# Patient Record
Sex: Female | Born: 1990 | Race: White | Hispanic: No | Marital: Single | Smoking: Never smoker
Health system: Southern US, Community
[De-identification: ages and names within clinical notes are randomized; demographics above are authoritative.]

## PROBLEM LIST (undated history)

## (undated) ENCOUNTER — Inpatient Hospital Stay: Payer: Self-pay

## (undated) DIAGNOSIS — R51 Headache: Secondary | ICD-10-CM

## (undated) DIAGNOSIS — J45909 Unspecified asthma, uncomplicated: Secondary | ICD-10-CM

## (undated) DIAGNOSIS — G43009 Migraine without aura, not intractable, without status migrainosus: Secondary | ICD-10-CM

## (undated) DIAGNOSIS — R519 Headache, unspecified: Secondary | ICD-10-CM

## (undated) DIAGNOSIS — N200 Calculus of kidney: Secondary | ICD-10-CM

## (undated) HISTORY — DX: Migraine without aura, not intractable, without status migrainosus: G43.009

## (undated) HISTORY — DX: Unspecified asthma, uncomplicated: J45.909

## (undated) HISTORY — DX: Headache: R51

## (undated) HISTORY — DX: Headache, unspecified: R51.9

---
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
## 2014-05-02 ENCOUNTER — Emergency Department: Payer: Self-pay | Admitting: Emergency Medicine

## 2014-05-02 LAB — URINALYSIS, COMPLETE
BACTERIA: NONE SEEN
BLOOD: NEGATIVE
Bilirubin,UR: NEGATIVE
Glucose,UR: NEGATIVE mg/dL (ref 0–75)
Ketone: NEGATIVE
LEUKOCYTE ESTERASE: NEGATIVE
Nitrite: NEGATIVE
PROTEIN: NEGATIVE
Ph: 6 (ref 4.5–8.0)
RBC,UR: 1 /HPF (ref 0–5)
Specific Gravity: 1.017 (ref 1.003–1.030)
Squamous Epithelial: 6
WBC UR: 1 /HPF (ref 0–5)

## 2014-05-09 ENCOUNTER — Ambulatory Visit (INDEPENDENT_AMBULATORY_CARE_PROVIDER_SITE_OTHER): Payer: BC Managed Care – PPO | Admitting: Family Medicine

## 2014-05-09 ENCOUNTER — Encounter: Payer: Self-pay | Admitting: Family Medicine

## 2014-05-09 ENCOUNTER — Encounter (INDEPENDENT_AMBULATORY_CARE_PROVIDER_SITE_OTHER): Payer: Self-pay

## 2014-05-09 VITALS — BP 108/60 | HR 66 | Temp 98.6°F | Ht 68.0 in | Wt 173.2 lb

## 2014-05-09 DIAGNOSIS — M546 Pain in thoracic spine: Secondary | ICD-10-CM

## 2014-05-09 DIAGNOSIS — G43009 Migraine without aura, not intractable, without status migrainosus: Secondary | ICD-10-CM

## 2014-05-09 MED ORDER — IBUPROFEN 800 MG PO TABS: 800.0000 mg | ORAL_TABLET | Freq: Two times a day (BID) | ORAL | Status: DC | PRN

## 2014-05-09 MED ORDER — CYCLOBENZAPRINE HCL 10 MG PO TABS
5.0000 mg | ORAL_TABLET | Freq: Three times a day (TID) | ORAL | Status: DC | PRN
Start: 2014-05-09 — End: 2015-12-07

## 2014-05-09 NOTE — Progress Notes (Signed)
.  Pre visit review using our clinic review tool, if applicable. No additional management support is needed unless otherwise documented below in the visit note.  To ER with back pain.  Had urinary frequency but no radicular leg sx.  No rash.  Pain was better laying down.  No fevers.  LMP 05/05/14.  Neg w/u at ER.  Taking ibuprofen for pain.  Local heat helps.  No trauma.  Urinary sx resolved.  ER records reviewed.   H/o frequent HA.  Throbbing, no aura, top of the head.  Nausea and vomiting.  +photo/phonophobia.  No known FH migraines.  Longstanding per patient.  Recently dec caffeine.    PMH and SH reviewed  ROS: See HPI, otherwise noncontributory.  Meds, vitals, and allergies reviewed.   GEN: nad, alert and oriented HEENT: mucous membranes moist NECK: supple w/o LA CV: rrr.  no murmur PULM: ctab, no inc wob ABD: soft, +bs EXT: no edema SKIN: no acute rash CN 2-12 wnl B, S/S/DTR wnl x4 No CVA pain but lower T spine with B paraspinal muscle tenderness and tightness.  No midline pain.

## 2014-05-09 NOTE — Patient Instructions (Signed)
Gently stretch your back and use heat to help loosen the muscles.  Take ibuprofen twice a day with food if needed.  Take flexeril if needed for back pain or for headaches. It can make you drowsy.  Start with a half tab.  Take care. Glad to see you.

## 2014-05-10 ENCOUNTER — Telehealth: Payer: Self-pay | Admitting: Family Medicine

## 2014-05-10 NOTE — Telephone Encounter (Signed)
emmi emailed °

## 2014-05-11 ENCOUNTER — Encounter: Payer: Self-pay | Admitting: Family Medicine

## 2014-05-11 DIAGNOSIS — M549 Dorsalgia, unspecified: Secondary | ICD-10-CM | POA: Insufficient documentation

## 2014-05-11 DIAGNOSIS — G43009 Migraine without aura, not intractable, without status migrainosus: Secondary | ICD-10-CM | POA: Insufficient documentation

## 2014-05-11 NOTE — Assessment & Plan Note (Signed)
Likely benign MSK source.  D/w pt.  Use heat, gently stretch, flexeril with sedation caution.  She agrees.

## 2014-05-11 NOTE — Assessment & Plan Note (Signed)
Already dec in caffeine, d/w pt.  Can use flexeril as above and use for likely migraine.  Sedation caution.  She agrees.  Update me as needed.

## 2014-06-13 ENCOUNTER — Other Ambulatory Visit (HOSPITAL_COMMUNITY)
Admission: RE | Admit: 2014-06-13 | Discharge: 2014-06-13 | Disposition: A | Discharge status: Home / Self Care | Payer: BC Managed Care – PPO | Source: Ambulatory Visit | Attending: Family Medicine | Admitting: Family Medicine

## 2014-06-13 ENCOUNTER — Encounter: Payer: Self-pay | Admitting: Family Medicine

## 2014-06-13 ENCOUNTER — Ambulatory Visit (INDEPENDENT_AMBULATORY_CARE_PROVIDER_SITE_OTHER): Payer: BC Managed Care – PPO | Admitting: Family Medicine

## 2014-06-13 VITALS — BP 98/64 | HR 82 | Temp 98.2°F | Ht 70.0 in | Wt 168.2 lb

## 2014-06-13 DIAGNOSIS — Z01419 Encounter for gynecological examination (general) (routine) without abnormal findings: Secondary | ICD-10-CM | POA: Diagnosis not present

## 2014-06-13 DIAGNOSIS — Z113 Encounter for screening for infections with a predominantly sexual mode of transmission: Secondary | ICD-10-CM | POA: Diagnosis present

## 2014-06-13 DIAGNOSIS — Z7189 Other specified counseling: Secondary | ICD-10-CM

## 2014-06-13 DIAGNOSIS — Z131 Encounter for screening for diabetes mellitus: Secondary | ICD-10-CM

## 2014-06-13 DIAGNOSIS — G43009 Migraine without aura, not intractable, without status migrainosus: Secondary | ICD-10-CM

## 2014-06-13 DIAGNOSIS — Z124 Encounter for screening for malignant neoplasm of cervix: Secondary | ICD-10-CM

## 2014-06-13 NOTE — Patient Instructions (Signed)
Go to the lab on the way out.  We'll contact you with your lab report. Take care.  Glad to see you.  If you want to get an IUD or other form of birth control, then let [place] know.

## 2014-06-13 NOTE — Assessment & Plan Note (Signed)
Done, gc/chlam pending.  D/w pt about birth control options.  She'll consider.  See notes on labs.

## 2014-06-13 NOTE — Assessment & Plan Note (Signed)
Improved with prn flexeril.
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
## 2014-06-13 NOTE — Assessment & Plan Note (Signed)
See notes on labs. 

## 2014-06-14 LAB — GLUCOSE, RANDOM: Glucose, Bld: 81 mg/dL (ref 70–99)

## 2014-06-17 LAB — CYTOLOGY - PAP

## 2015-03-13 ENCOUNTER — Emergency Department
Admission: EM | Admit: 2015-03-13 | Discharge: 2015-03-13 | Disposition: A | Discharge status: Home / Self Care | Payer: No Typology Code available for payment source | Attending: Emergency Medicine | Admitting: Emergency Medicine

## 2015-03-13 ENCOUNTER — Encounter: Payer: Self-pay | Admitting: *Deleted

## 2015-03-13 DIAGNOSIS — S161XXA Strain of muscle, fascia and tendon at neck level, initial encounter: Secondary | ICD-10-CM | POA: Insufficient documentation

## 2015-03-13 DIAGNOSIS — T310 Burns involving less than 10% of body surface: Secondary | ICD-10-CM

## 2015-03-13 DIAGNOSIS — Y9389 Activity, other specified: Secondary | ICD-10-CM | POA: Diagnosis not present

## 2015-03-13 DIAGNOSIS — S199XXA Unspecified injury of neck, initial encounter: Secondary | ICD-10-CM | POA: Diagnosis present

## 2015-03-13 DIAGNOSIS — G44209 Tension-type headache, unspecified, not intractable: Secondary | ICD-10-CM

## 2015-03-13 DIAGNOSIS — S3992XA Unspecified injury of lower back, initial encounter: Secondary | ICD-10-CM | POA: Diagnosis not present

## 2015-03-13 DIAGNOSIS — Y998 Other external cause status: Secondary | ICD-10-CM | POA: Insufficient documentation

## 2015-03-13 DIAGNOSIS — Y92410 Unspecified street and highway as the place of occurrence of the external cause: Secondary | ICD-10-CM | POA: Insufficient documentation

## 2015-03-13 DIAGNOSIS — S0990XA Unspecified injury of head, initial encounter: Secondary | ICD-10-CM | POA: Insufficient documentation

## 2015-03-13 DIAGNOSIS — S59912A Unspecified injury of left forearm, initial encounter: Secondary | ICD-10-CM | POA: Diagnosis not present

## 2015-03-13 MED ORDER — IBUPROFEN 800 MG PO TABS: 800.0000 mg | ORAL_TABLET | Freq: Three times a day (TID) | ORAL | Status: DC | PRN

## 2015-03-13 MED ORDER — SILVER SULFADIAZINE 1 % EX CREA: TOPICAL_CREAM | CUTANEOUS | Status: DC

## 2015-03-13 MED ORDER — BUTALBITAL-APAP-CAFFEINE 50-325-40 MG PO TABS: 1.0000 | ORAL_TABLET | Freq: Four times a day (QID) | ORAL | Status: DC | PRN

## 2015-03-13 MED ORDER — IBUPROFEN 800 MG PO TABS
800.0000 mg | ORAL_TABLET | Freq: Once | ORAL | Status: AC
  Administered 2015-03-13: 800 mg via ORAL
  Filled 2015-03-13: qty 1

## 2015-03-13 MED ORDER — BUTALBITAL-APAP-CAFFEINE 50-325-40 MG PO TABS
1.0000 | ORAL_TABLET | Freq: Once | ORAL | Status: AC
  Administered 2015-03-13: 1 via ORAL
  Filled 2015-03-13: qty 1
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
## 2015-03-13 NOTE — Discharge Instructions (Signed)

## 2015-03-15 ENCOUNTER — Emergency Department: Payer: No Typology Code available for payment source

## 2015-03-15 ENCOUNTER — Encounter: Payer: Self-pay | Admitting: Emergency Medicine

## 2015-03-15 ENCOUNTER — Emergency Department
Admission: EM | Admit: 2015-03-15 | Discharge: 2015-03-15 | Disposition: A | Discharge status: Home / Self Care | Payer: No Typology Code available for payment source | Attending: Student | Admitting: Student

## 2015-03-15 DIAGNOSIS — Y9389 Activity, other specified: Secondary | ICD-10-CM | POA: Diagnosis not present

## 2015-03-15 DIAGNOSIS — Y92410 Unspecified street and highway as the place of occurrence of the external cause: Secondary | ICD-10-CM | POA: Insufficient documentation

## 2015-03-15 DIAGNOSIS — M545 Low back pain, unspecified: Secondary | ICD-10-CM

## 2015-03-15 DIAGNOSIS — S3992XA Unspecified injury of lower back, initial encounter: Secondary | ICD-10-CM | POA: Diagnosis not present

## 2015-03-15 DIAGNOSIS — Y998 Other external cause status: Secondary | ICD-10-CM | POA: Insufficient documentation

## 2015-03-15 DIAGNOSIS — Z792 Long term (current) use of antibiotics: Secondary | ICD-10-CM | POA: Diagnosis not present

## 2015-03-15 MED ORDER — TRAMADOL HCL 50 MG PO TABS: 50.0000 mg | ORAL_TABLET | Freq: Four times a day (QID) | ORAL | Status: DC | PRN
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
## 2015-07-02 NOTE — L&D Delivery Note (Signed)
Delivery Summary for Energy Transfer PartnersChelsea L Ortiz  Labor Events:   Preterm labor:   Rupture date:   Rupture time:   Rupture type: Spontaneous  Fluid Color: Clear  Induction:   Augmentation:   Complications:   Cervical ripening:          Delivery:   Episiotomy:   Lacerations:   Repair suture:   Repair # of packets:   Blood loss (ml): 100   Information for the patient's newborn:  Teresa LiasSnipes, Teresa Teresa [[id number]][[id number]]    Delivery 06/01/2016 4:41 AM by  Vaginal, Spontaneous Delivery Sex:  female Gestational Age: [id number] Delivery Clinician:   Living?:         APGARS  One minute Five minutes Ten minutes  Skin color:        Heart rate:        Grimace:        Muscle tone:        Breathing:        Totals: 8   9     Presentation/position:      Resuscitation:   Cord information:    Disposition of cord blood:     Blood gases sent?  Complications:   Placenta: Delivered:       appearance Newborn Measurements: Weight: 6 lb 1 oz (2750 g)  Height: 19.5"  Head circumference:    Chest circumference:    Other providers:    Additional  information: Forceps:   Vacuum:   Breech:   Observed anomalies       Delivery Note At 4:41 AM a viable and healthy female was delivered via Vaginal, Spontaneous Delivery (Presentation: Vertex; LOA position).  APGAR: 8, 9; weight 6 lb 1 oz (2750 g).   Placenta status: spontaneously removed, intact.  Cord: 3-vessel, with the following complications: nuchal cord x 2, reducible.  Cord pH: not obtained. Cord blood obtained.  Delayed cord clamping performed.   Anesthesia:  Epidural Episiotomy: None Lacerations:  None Suture Repair: None Est. Blood Loss (mL):  100  Mom to postpartum.  Baby to Couplet care / Skin to Skin.  Hildred Lasernika Gwin Eagon 06/01/2016, 5:01 AM

## 2015-11-18 ENCOUNTER — Emergency Department
Admission: EM | Admit: 2015-11-18 | Discharge: 2015-11-18 | Disposition: A | Discharge status: Home / Self Care | Payer: BC Managed Care – PPO | Attending: Emergency Medicine | Admitting: Emergency Medicine

## 2015-11-18 ENCOUNTER — Emergency Department: Payer: BC Managed Care – PPO

## 2015-11-18 ENCOUNTER — Encounter: Payer: Self-pay | Admitting: Emergency Medicine

## 2015-11-18 DIAGNOSIS — O99891 Other specified diseases and conditions complicating pregnancy: Secondary | ICD-10-CM

## 2015-11-18 DIAGNOSIS — M549 Dorsalgia, unspecified: Secondary | ICD-10-CM

## 2015-11-18 DIAGNOSIS — O26891 Other specified pregnancy related conditions, first trimester: Secondary | ICD-10-CM | POA: Insufficient documentation

## 2015-11-18 DIAGNOSIS — R102 Pelvic and perineal pain: Secondary | ICD-10-CM | POA: Insufficient documentation

## 2015-11-18 DIAGNOSIS — O9989 Other specified diseases and conditions complicating pregnancy, childbirth and the puerperium: Secondary | ICD-10-CM

## 2015-11-18 DIAGNOSIS — J45909 Unspecified asthma, uncomplicated: Secondary | ICD-10-CM | POA: Diagnosis not present

## 2015-11-18 DIAGNOSIS — Z3A10 10 weeks gestation of pregnancy: Secondary | ICD-10-CM | POA: Insufficient documentation

## 2015-11-18 DIAGNOSIS — R109 Unspecified abdominal pain: Secondary | ICD-10-CM | POA: Diagnosis present

## 2015-11-18 DIAGNOSIS — M545 Low back pain: Secondary | ICD-10-CM | POA: Insufficient documentation

## 2015-11-18 DIAGNOSIS — R52 Pain, unspecified: Secondary | ICD-10-CM

## 2015-11-18 DIAGNOSIS — Z79899 Other long term (current) drug therapy: Secondary | ICD-10-CM | POA: Diagnosis not present

## 2015-11-18 LAB — BASIC METABOLIC PANEL
ANION GAP: 9 (ref 5–15)
BUN: 16 mg/dL (ref 6–20)
CALCIUM: 9.5 mg/dL (ref 8.9–10.3)
CHLORIDE: 102 mmol/L (ref 101–111)
CO2: 25 mmol/L (ref 22–32)
CREATININE: 1.33 mg/dL — AB (ref 0.44–1.00)
GFR calc non Af Amer: 55 mL/min — ABNORMAL LOW (ref >60)
Glucose, Bld: 114 mg/dL — ABNORMAL HIGH (ref 65–99)
Potassium: 3.4 mmol/L — ABNORMAL LOW (ref 3.5–5.1)
SODIUM: 136 mmol/L (ref 135–145)

## 2015-11-18 LAB — URINALYSIS COMPLETE WITH MICROSCOPIC (ARMC ONLY)
BILIRUBIN URINE: NEGATIVE
GLUCOSE, UA: NEGATIVE mg/dL
KETONES UR: NEGATIVE mg/dL
Leukocytes, UA: NEGATIVE
NITRITE: NEGATIVE
Protein, ur: NEGATIVE mg/dL
SPECIFIC GRAVITY, URINE: 1.008 (ref 1.005–1.030)
pH: 6 (ref 5.0–8.0)

## 2015-11-18 LAB — CBC
HCT: 36.4 % (ref 35.0–47.0)
Hemoglobin: 11.9 g/dL — ABNORMAL LOW (ref 12.0–16.0)
MCH: 23.6 pg — ABNORMAL LOW (ref 26.0–34.0)
MCHC: 32.7 g/dL (ref 32.0–36.0)
MCV: 72.1 fL — AB (ref 80.0–100.0)
Platelets: 242 10*3/uL (ref 150–440)
RBC: 5.05 MIL/uL (ref 3.80–5.20)
RDW: 21.5 % — ABNORMAL HIGH (ref 11.5–14.5)
WBC: 9.4 10*3/uL (ref 3.6–11.0)

## 2015-11-18 LAB — HCG, QUANTITATIVE, PREGNANCY: HCG, BETA CHAIN, QUANT, S: 209987 m[IU]/mL — AB (ref <5)

## 2015-11-18 NOTE — ED Provider Notes (Signed)
[facility] Emergency Department Provider Note    ____________________________________________  Time seen: ~1635  I have reviewed the triage vital signs and the nursing notes.   HISTORY  Chief Complaint Flank Pain and Emesis   History limited by: Not Limited   HPI Teresa Ortiz is a 25 y.o. female at roughly [redacted] weeks pregnant by dates who presents to the emergency department today because of concern for flank pain. Patient describes the pain as being located in the right flank and back. She states that it started about a week ago. She states it had been somewhat manageable until last night. She states that it woke her up from sleep and kept her up for a number of hours. She describes it as a pressure-like pain. She describes it reminds her somewhat of labor pains. The patient does not have any unusual vaginal discharge or bleeding. No change in urination. No fevers.   Past Medical History  Diagnosis Date  . Frequent headaches   . Migraine without aura   . Asthma     childhood    Patient Active Problem List   Diagnosis Date Noted  . Advance care planning 06/13/2014  . Pap smear for cervical cancer screening 06/13/2014  . Diabetes mellitus screening 06/13/2014  . Migraine without aura 05/11/2014  . Back pain 05/11/2014    History reviewed. No pertinent past surgical history.  Current Outpatient Rx  Name  Route  Sig  Dispense  Refill  . butalbital-acetaminophen-caffeine (FIORICET) 50-325-40 MG per tablet   Oral   Take 1-2 tablets by mouth every 6 (six) hours as needed for headache.   12 tablet   0   . cyclobenzaprine (FLEXERIL) 10 MG tablet   Oral   Take 0.5-1 tablets (5-10 mg total) by mouth 3 (three) times daily as needed for muscle spasms (sedation caution).   30 tablet   1   . ibuprofen (ADVIL,MOTRIN) 800 MG tablet   Oral   Take 1 tablet (800 mg total) by mouth every 8 (eight) hours as needed.   30 tablet   0   . silver  sulfADIAZINE (SILVADENE) 1 % cream      Apply to affected area daily   50 g   1   . traMADol (ULTRAM) 50 MG tablet   Oral   Take 1 tablet (50 mg total) by mouth every 6 (six) hours as needed for moderate pain.   12 tablet   0     Allergies Sulfa antibiotics  Family History  Problem Relation Age of Onset  . Mental illness Mother     bipolor/depressed  . Alcohol abuse Father     Social History Social History  Substance Use Topics  . Smoking status: Never Smoker   . Smokeless tobacco: Never Used  . Alcohol Use: 0.0 oz/week    0 Standard drinks or equivalent per week     Comment: rarely    Review of Systems  Constitutional: Negative for fever. Cardiovascular: Negative for chest pain. Respiratory: Negative for shortness of breath. Gastrointestinal: Positive for right flank. Genitourinary: Negative for dysuria. Musculoskeletal: Positive for back pain Skin: Negative for rash. Neurological: Negative for headaches, focal weakness or numbness.  10-point ROS otherwise negative.  ____________________________________________   PHYSICAL EXAM:  VITAL SIGNS: ED Triage Vitals  Enc Vitals Group     BP 11/18/15 1343 116/73 mmHg     Pulse Rate 11/18/15 1343 69     Resp 11/18/15 1343 18  Temp 11/18/15 1343 98.2 F (36.8 C)     Temp Source 11/18/15 1343 Oral     SpO2 11/18/15 1343 100 %     Weight 11/18/15 1343 170 lb (77.111 kg)     Height 11/18/15 1343  (1.778 m)     Head Cir --      Peak Flow --      Pain Score 11/18/15 1344 9   Constitutional: Alert and oriented. Well appearing and in no distress. Eyes: Conjunctivae are normal. PERRL. Normal extraocular movements. ENT   Head: Normocephalic and atraumatic.   Nose: No congestion/rhinnorhea.   Mouth/Throat: Mucous membranes are moist.   Neck: No stridor. Hematological/Lymphatic/Immunilogical: No cervical lymphadenopathy. Cardiovascular: Normal rate, regular rhythm.  No murmurs, rubs, or  gallops. Respiratory: Normal respiratory effort without tachypnea nor retractions. Breath sounds are clear and equal bilaterally. No wheezes/rales/rhonchi. Gastrointestinal: Soft and nontender. No distention.  Genitourinary: Deferred Musculoskeletal: Normal range of motion in all extremities. No joint effusions.  No lower extremity tenderness nor edema. Neurologic:  Normal speech and language. No gross focal neurologic deficits are appreciated.  Skin:  Skin is warm, dry and intact. No rash noted. Psychiatric: Mood and affect are normal. Speech and behavior are normal. Patient exhibits appropriate insight and judgment.  ____________________________________________    LABS (pertinent positives/negatives)  Labs Reviewed  URINALYSIS COMPLETEWITH MICROSCOPIC ([facility] ONLY) - Abnormal; Notable for the following:    Color, Urine YELLOW (*)    APPearance HAZY (*)    Hgb urine dipstick 1+ (*)    Bacteria, UA RARE (*)    Squamous Epithelial / LPF 6-30 (*)    All other components within normal limits  CBC - Abnormal; Notable for the following:    Hemoglobin 11.9 (*)    MCV 72.1 (*)    MCH 23.6 (*)    RDW 21.5 (*)    All other components within normal limits  BASIC METABOLIC PANEL - Abnormal; Notable for the following:    Potassium 3.4 (*)    Glucose, Bld 114 (*)    Creatinine, Ser 1.33 (*)    GFR calc non Af Amer 55 (*)    All other components within normal limits  HCG, QUANTITATIVE, PREGNANCY - Abnormal; Notable for the following:    hCG, Beta Francene Finders [id number] (*)    All other components within normal limits     ____________________________________________   EKG  I, Phineas Semen, attending physician, personally viewed and interpreted this EKG  EKG Time: 1356 Rate: 73 Rhythm: normal sinus rhythm Axis: normal Intervals: qtc 401 QRS: narrow ST changes: no st elevation Impression: normal EKG  ____________________________________________    RADIOLOGY  US  pelvis IMPRESSION: Single live IUP. No cause for pain identified.  ____________________________________________   PROCEDURES  Procedure(s) performed: None  Critical Care performed: No  ____________________________________________   INITIAL IMPRESSION / ASSESSMENT AND PLAN / ED COURSE  Pertinent labs & imaging results that were available during my care of the patient were reviewed by me and considered in my medical decision making (see chart for details).  Patient presents to the emergency department today with roughly 1 week of right flank and back pain. On exam abdomen is benign. Patient has not had an ultrasound performed on this pregnancy. Given concern for ectopic will obtain ultrasound. Blood work without any concerning elevation of leukocytosis.  ----------------------------------------- 6:28 PM on 11/18/2015 -----------------------------------------  Ultrasound shows a single intrauterine pregnancy at 10 weeks. I did discussion with the patient that the  ultrasound looked good. Additionally given lack of fever or leukocytosis I discussed with patient that I would think appendicitis to be very unlikely. Urine also without any concerning signs of kidney infection. Did discuss return precautions with the patient. Will have her follow up with primary care.  ____________________________________________   FINAL CLINICAL IMPRESSION(S) / ED DIAGNOSES  Final diagnoses:  Pelvic pain in female  Back pain in pregnancy     Phineas SemenGraydon Yesenia Fontenette, MD 11/18/15 [id number]

## 2015-11-18 NOTE — ED Notes (Signed)
Upon hooking pt up for pulse, HR noted to be jumping from 74-120, not regular, pulse palpated and noted to be the same. EKG ordered.

## 2015-11-18 NOTE — Discharge Instructions (Signed)
Please seek medical attention for any high fevers, chest pain, shortness of breath, change in behavior, persistent vomiting, bloody stool or any other new or concerning symptoms.   Abdominal Pain During Pregnancy Belly (abdominal) pain is common during pregnancy. Most of the time, it is not a serious problem. Other times, it can be a sign that something is wrong with the pregnancy. Always tell your doctor if you have belly pain. HOME CARE Monitor your belly pain for any changes. The following actions may help you feel better:  Do not have sex (intercourse) or put anything in your vagina until you feel better.  Rest until your pain stops.  Drink clear fluids if you feel sick to your stomach (nauseous). Do not eat solid food until you feel better.  Only take medicine as told by your doctor.  Keep all doctor visits as told. GET HELP RIGHT AWAY IF:   You are bleeding, leaking fluid, or pieces of tissue come out of your vagina.  You have more pain or cramping.  You keep throwing up (vomiting).  You have pain when you pee (urinate) or have blood in your pee.  You have a fever.  You do not feel your baby moving as much.  You feel very weak or feel like passing out.  You have trouble breathing, with or without belly pain.  You have a very bad headache and belly pain.  You have fluid leaking from your vagina and belly pain.  You keep having watery poop (diarrhea).  Your belly pain does not go away after resting, or the pain gets worse. MAKE SURE YOU:   Understand these instructions.  Will watch your condition.  Will get help right away if you are not doing well or get worse.   This information is not intended to replace advice given to you by your health care provider. Make sure you discuss any questions you have with your health care provider.   Document Released: 06/05/2009 Document Revised: 02/17/2013 Document Reviewed: 01/14/2013 Elsevier Interactive Patient Education  [employer] Elsevier Inc.

## 2015-11-18 NOTE — ED Notes (Signed)
Pt here with c/o right flank pain, nausea/vomiting and pain when she takes a deep breath, is 11 pregnant, denies vaginal bleeding, tearful in triage.

## 2015-11-18 NOTE — ED Notes (Signed)
Patient resting comfortably. Awaiting results. Call bell within reach.

## 2015-12-07 ENCOUNTER — Ambulatory Visit (INDEPENDENT_AMBULATORY_CARE_PROVIDER_SITE_OTHER): Payer: Medicaid Other

## 2015-12-07 ENCOUNTER — Encounter: Payer: Self-pay | Admitting: Obstetrics and Gynecology

## 2015-12-07 ENCOUNTER — Other Ambulatory Visit: Payer: Self-pay | Admitting: Obstetrics and Gynecology

## 2015-12-07 ENCOUNTER — Ambulatory Visit (INDEPENDENT_AMBULATORY_CARE_PROVIDER_SITE_OTHER): Payer: Medicaid Other | Admitting: Obstetrics and Gynecology

## 2015-12-07 VITALS — BP 96/59 | HR 75 | Wt 167.5 lb

## 2015-12-07 DIAGNOSIS — Z8669 Personal history of other diseases of the nervous system and sense organs: Secondary | ICD-10-CM

## 2015-12-07 DIAGNOSIS — Z283 Underimmunization status: Secondary | ICD-10-CM

## 2015-12-07 DIAGNOSIS — Z36 Encounter for antenatal screening of mother: Secondary | ICD-10-CM

## 2015-12-07 DIAGNOSIS — O9989 Other specified diseases and conditions complicating pregnancy, childbirth and the puerperium: Secondary | ICD-10-CM

## 2015-12-07 DIAGNOSIS — Z3682 Encounter for antenatal screening for nuchal translucency: Secondary | ICD-10-CM

## 2015-12-07 DIAGNOSIS — Z2839 Other underimmunization status: Secondary | ICD-10-CM

## 2015-12-07 DIAGNOSIS — Z3492 Encounter for supervision of normal pregnancy, unspecified, second trimester: Secondary | ICD-10-CM | POA: Diagnosis not present

## 2015-12-07 DIAGNOSIS — Z3490 Encounter for supervision of normal pregnancy, unspecified, unspecified trimester: Secondary | ICD-10-CM

## 2015-12-07 NOTE — Progress Notes (Signed)
[facility]  GYN ENCOUNTER NOTE  Subjective:       Teresa Ortiz is a 25 y.o. G6P1001 female is here for gynecologic evaluation of the following issues:  1. New OB history and physical.    25 year old single monogamous white female, G2 para 1001, ultrasound on 11/18/2015 demonstrated a 10.0 week viable intrauterine pregnancy with EDD 06/15/2016, currently 12.[redacted] weeks gestation, presents now for establishment of prenatal care.  Patient reports breast tenderness and mild nausea without vomiting. She is not experiencing any vaginal bleeding  Prenatal risk factors:  Migraine headaches   Gynecologic History LMP: Uncertain Contraception: None No history of abnormal Pap smears  Obstetric History OB History  Gravida Para Term Preterm AB SAB TAB Ectopic Multiple Living  # Outcome Date GA Lbr Len/2nd Weight Sex Delivery Anes PTL Lv  2 Current           1 Term 2011   6 lb 6.4 oz (2.903 kg) F Vag-Spont   Y      Past Medical History  Diagnosis Date  . Frequent headaches   . Migraine without aura   . Asthma     childhood    No past surgical history on file.  No current outpatient prescriptions on file prior to visit.   No current facility-administered medications on file prior to visit.    Allergies  Allergen Reactions  . Sulfa Antibiotics Rash    Social History   Social History  . Marital Status: Single    Spouse Name: N/A  . Number of Children: N/A  . Years of Education: N/A   Occupational History  . Not on file.   Social History Main Topics  . Smoking status: Never Smoker   . Smokeless tobacco: Never Used  . Alcohol Use: 0.0 oz/week    0 Standard drinks or equivalent per week     Comment: rarely  . Drug Use: Not on file  . Sexual Activity: Not on file   Other Topics Concern  . Not on file   Social History Narrative   1 child   Lives with boyfriend and his 2 kids    Family History  Problem Relation Age of Onset  . Mental illness  Mother     bipolor/depressed  . Alcohol abuse Father     The following portions of the patient's history were reviewed and updated as appropriate: allergies, current medications, past family history, past medical history, past social history, past surgical history and problem list.  Review of Systems Review of Systems - General ROS: negative for - chills, fatigue, fever, hot flashes, malaise or night sweats Hematological and Lymphatic ROS: negative for - bleeding problems or swollen lymph nodes Gastrointestinal ROS: negative for - abdominal pain, blood in stools, change in bowel habits and nausea/vomiting Musculoskeletal ROS: negative for - joint pain, muscle pain or muscular weakness Genito-Urinary ROS: negative for - change in menstrual cycle, dysmenorrhea, dyspareunia, dysuria, genital discharge, genital ulcers, hematuria, incontinence, irregular/heavy menses, nocturia or pelvic pain POSITIVE-breast tenderness and nausea  Objective:   BP 96/59 mmHg  Pulse 75  Wt 167 lb 8 oz (75.978 kg)  LMP 09/12/2015 CONSTITUTIONAL: Well-developed, well-nourished female in no acute distress.  HENT:  Normocephalic, atraumatic.  NECK: Normal range of motion, supple, no masses.  Normal thyroid.  SKIN: Skin is warm and dry. No rash noted. Not diaphoretic. No erythema. No pallor. NEUROLGIC: Alert and oriented to person,  place, and time. PSYCHIATRIC: Normal mood and affect. Normal behavior. Normal judgment and thought content. CARDIOVASCULAR:Not Examined RESPIRATORY: Not Examined BREASTS: Not Examined ABDOMEN: Soft, non distended; Non tender.  No Organomegaly. PELVIC:  External Genitalia: Normal  BUS: Normal  Vagina: Normal  Cervix: Normal  Uterus: Retroverted, 12 weeks size, normal shape, normal consistency, mobile  Adnexa: Normal  RV: Normal external exam  Bladder: Nontender MUSCULOSKELETAL: Normal range of motion. No tenderness.  No cyanosis, clubbing, or edema.     Assessment:   1.  Supervision of normal pregnancy, second trimester - Pap IG, CT/NG w/ reflex HPV when ASC-U - ABO AND RH  - Antibody screen - CBC with Differential/Platelet - Culture, OB Urine - Hepatitis B surface antigen - HIV antibody - Monitor Drug Profile 14(MW) - Nicotine screen, urine - Varicella zoster antibody, IgG - Urinalysis, Routine w reflex microscopic (not at [facility]) - Rubella screen - RPR - US Fetal Nuchal Translucency Measurement; Future  2. Migraine headaches     Plan:   1. Prenatal vitamins daily 2. Nuchal translucency testing-patient desires 3.New OB counseling: The patient has been given an overview regarding routine prenatal care. Recommendations regarding diet, weight gain, and exercise in pregnancy were given. Prenatal testing, optional genetic testing, and ultrasound use in pregnancy were reviewed.  Benefits of Breast Feeding were discussed. The patient is encouraged to consider nursing her baby post partum. 4. Regular OB appointment in 4 weeks  Herold HarmsMartin A Zakira Ressel, MD  Note: This dictation was prepared with Dragon dictation along with smaller phrase technology. Any transcriptional errors that result from this process are unintentional.

## 2015-12-07 NOTE — Patient Instructions (Signed)
1. Pap smear is done 2.  NewOB labs are obtained 3. Nuchal translucency testing is ordered 4. Return in 4 weeks for regular OB appointment

## 2015-12-08 LAB — CBC WITH DIFFERENTIAL/PLATELET
BASOS: 0 %
Basophils Absolute: 0 10*3/uL (ref 0.0–0.2)
EOS (ABSOLUTE): 0 10*3/uL (ref 0.0–0.4)
EOS: 0 %
HEMATOCRIT: 37 % (ref 34.0–46.6)
Hemoglobin: 11.8 g/dL (ref 11.1–15.9)
IMMATURE GRANS (ABS): 0 10*3/uL (ref 0.0–0.1)
IMMATURE GRANULOCYTES: 0 %
LYMPHS: 17 %
Lymphocytes Absolute: 1.2 10*3/uL (ref 0.7–3.1)
MCH: 24.6 pg — ABNORMAL LOW (ref 26.6–33.0)
MCHC: 31.9 g/dL (ref 31.5–35.7)
MCV: 77 fL — AB (ref 79–97)
MONOS ABS: 0.4 10*3/uL (ref 0.1–0.9)
Monocytes: 5 %
NEUTROS PCT: 78 %
Neutrophils Absolute: 5.6 10*3/uL (ref 1.4–7.0)
PLATELETS: 289 10*3/uL (ref 150–379)
RBC: 4.8 x10E6/uL (ref 3.77–5.28)
RDW: 20.6 % — AB (ref 12.3–15.4)
WBC: 7.2 10*3/uL (ref 3.4–10.8)

## 2015-12-08 LAB — RUBELLA SCREEN

## 2015-12-08 LAB — VARICELLA ZOSTER ANTIBODY, IGG: VARICELLA: 958 {index} (ref >165)

## 2015-12-08 LAB — HEPATITIS B SURFACE ANTIGEN: Hepatitis B Surface Ag: NEGATIVE

## 2015-12-08 LAB — HIV ANTIBODY (ROUTINE TESTING W REFLEX): HIV SCREEN 4TH GENERATION: NONREACTIVE

## 2015-12-08 LAB — ABO AND RH: Rh Factor: POSITIVE

## 2015-12-08 LAB — ANTIBODY SCREEN: Antibody Screen: NEGATIVE

## 2015-12-08 LAB — RPR: RPR Ser Ql: NONREACTIVE

## 2015-12-09 LAB — URINALYSIS, ROUTINE W REFLEX MICROSCOPIC
BILIRUBIN UA: NEGATIVE
GLUCOSE, UA: NEGATIVE
KETONES UA: NEGATIVE
LEUKOCYTES UA: NEGATIVE
Nitrite, UA: NEGATIVE
PROTEIN UA: NEGATIVE
RBC UA: NEGATIVE
SPEC GRAV UA: 1.018 (ref 1.005–1.030)
UUROB: 0.2 mg/dL (ref 0.2–1.0)
pH, UA: 6 (ref 5.0–7.5)

## 2015-12-09 LAB — MONITOR DRUG PROFILE 14(MW)
AMPHETAMINE SCREEN URINE: NEGATIVE ng/mL
BARBITURATE SCREEN URINE: NEGATIVE ng/mL
BENZODIAZEPINE SCREEN, URINE: NEGATIVE ng/mL
Buprenorphine, Urine: NEGATIVE ng/mL
CANNABINOIDS UR QL SCN: NEGATIVE ng/mL
Cocaine (Metab) Scrn, Ur: NEGATIVE ng/mL
Creatinine(Crt), U: 95.3 mg/dL (ref 20.0–300.0)
FENTANYL, URINE: NEGATIVE pg/mL
Meperidine Screen, Urine: NEGATIVE ng/mL
Methadone Screen, Urine: NEGATIVE ng/mL
OXYCODONE+OXYMORPHONE UR QL SCN: NEGATIVE ng/mL
Opiate Scrn, Ur: NEGATIVE ng/mL
PH UR, DRUG SCRN: 5.9 (ref 4.5–8.9)
PROPOXYPHENE SCREEN URINE: NEGATIVE ng/mL
Phencyclidine Qn, Ur: NEGATIVE ng/mL
SPECIFIC GRAVITY: 1.023
Tramadol Screen, Urine: NEGATIVE ng/mL

## 2015-12-09 LAB — NICOTINE SCREEN, URINE: COTININE UR QL SCN: NEGATIVE ng/mL

## 2015-12-11 LAB — FIRST TRIMESTER SCREEN W/NT
CRL: 65.8 mm
DIA MoM: 0.83
DIA Value: 187.5 pg/mL
GEST AGE-COLLECT: 12.7 wk
HCG MOM: 1.2
HCG VALUE: 96.5 [IU]/mL
MATERNAL AGE AT EDD: 26 a
NUCHAL TRANSLUCENCY MOM: 0.97
NUCHAL TRANSLUCENCY: 1.6 mm
NUMBER OF FETUSES: 1
PAPP-A MoM: 1.04
PAPP-A Value: 946.3 ng/mL
PDF: 0
Test Results:: NEGATIVE
WEIGHT: 167 [lb_av]

## 2015-12-11 LAB — URINE CULTURE, OB REFLEX

## 2015-12-11 LAB — CULTURE, OB URINE

## 2015-12-11 LAB — PAP IG, CT-NG, RFX HPV ASCU
Chlamydia, Nuc. Acid Amp: NEGATIVE
Gonococcus by Nucleic Acid Amp: NEGATIVE
PAP Smear Comment: 0

## 2015-12-20 ENCOUNTER — Other Ambulatory Visit: Payer: Self-pay

## 2015-12-20 DIAGNOSIS — O09899 Supervision of other high risk pregnancies, unspecified trimester: Secondary | ICD-10-CM | POA: Insufficient documentation

## 2015-12-20 DIAGNOSIS — Z283 Underimmunization status: Secondary | ICD-10-CM | POA: Insufficient documentation

## 2015-12-20 DIAGNOSIS — O9989 Other specified diseases and conditions complicating pregnancy, childbirth and the puerperium: Secondary | ICD-10-CM

## 2015-12-20 MED ORDER — NITROFURANTOIN MONOHYD MACRO 100 MG PO CAPS: 100.0000 mg | ORAL_CAPSULE | Freq: Two times a day (BID) | ORAL | Status: DC

## 2015-12-25 DIAGNOSIS — Z8669 Personal history of other diseases of the nervous system and sense organs: Secondary | ICD-10-CM | POA: Insufficient documentation

## 2015-12-25 DIAGNOSIS — Z3490 Encounter for supervision of normal pregnancy, unspecified, unspecified trimester: Secondary | ICD-10-CM | POA: Insufficient documentation

## 2016-01-04 ENCOUNTER — Ambulatory Visit (INDEPENDENT_AMBULATORY_CARE_PROVIDER_SITE_OTHER): Payer: Medicaid Other | Admitting: Obstetrics and Gynecology

## 2016-01-04 ENCOUNTER — Encounter: Payer: Self-pay | Admitting: Obstetrics and Gynecology

## 2016-01-04 VITALS — BP 100/64 | HR 78 | Wt 164.3 lb

## 2016-01-04 DIAGNOSIS — Z3492 Encounter for supervision of normal pregnancy, unspecified, second trimester: Secondary | ICD-10-CM

## 2016-01-04 DIAGNOSIS — Z36 Encounter for antenatal screening of mother: Secondary | ICD-10-CM

## 2016-01-04 DIAGNOSIS — Z3482 Encounter for supervision of other normal pregnancy, second trimester: Secondary | ICD-10-CM

## 2016-01-04 DIAGNOSIS — Z369 Encounter for antenatal screening, unspecified: Secondary | ICD-10-CM

## 2016-01-04 DIAGNOSIS — N949 Unspecified condition associated with female genital organs and menstrual cycle: Secondary | ICD-10-CM

## 2016-01-04 DIAGNOSIS — Z1389 Encounter for screening for other disorder: Secondary | ICD-10-CM

## 2016-01-04 LAB — POCT URINALYSIS DIPSTICK
Bilirubin, UA: NEGATIVE
Blood, UA: NEGATIVE
Glucose, UA: NEGATIVE
KETONES UA: NEGATIVE
LEUKOCYTES UA: NEGATIVE
Nitrite, UA: NEGATIVE
PROTEIN UA: NEGATIVE
Spec Grav, UA: 1.015
Urobilinogen, UA: NEGATIVE
pH, UA: 7

## 2016-01-07 NOTE — Progress Notes (Signed)
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  Discussed lifting restrictions, advised on more frequent breaks if possible.  Work note given. Encouraged Tylenol, warm baths.  RTC in 4 weeks, for anatomy scan at next visit.

## 2016-01-30 ENCOUNTER — Ambulatory Visit (INDEPENDENT_AMBULATORY_CARE_PROVIDER_SITE_OTHER): Payer: Medicaid Other

## 2016-01-30 ENCOUNTER — Ambulatory Visit (INDEPENDENT_AMBULATORY_CARE_PROVIDER_SITE_OTHER): Payer: Medicaid Other | Admitting: Obstetrics and Gynecology

## 2016-01-30 VITALS — BP 101/61 | HR 69 | Wt 172.7 lb

## 2016-01-30 DIAGNOSIS — Z3492 Encounter for supervision of normal pregnancy, unspecified, second trimester: Secondary | ICD-10-CM

## 2016-01-30 DIAGNOSIS — Z3482 Encounter for supervision of other normal pregnancy, second trimester: Secondary | ICD-10-CM

## 2016-01-30 LAB — POCT URINALYSIS DIPSTICK
BILIRUBIN UA: NEGATIVE
GLUCOSE UA: NEGATIVE
Ketones, UA: NEGATIVE
Leukocytes, UA: NEGATIVE
NITRITE UA: NEGATIVE
Protein, UA: NEGATIVE
RBC UA: NEGATIVE
SPEC GRAV UA: 1.015
UROBILINOGEN UA: NEGATIVE
pH, UA: 6

## 2016-01-30 NOTE — Progress Notes (Signed)
Doing well, no complaints. Normal anatomy scan. RTC in 4 weeks.

## 2016-03-05 ENCOUNTER — Ambulatory Visit (INDEPENDENT_AMBULATORY_CARE_PROVIDER_SITE_OTHER): Payer: Medicaid Other | Admitting: Obstetrics and Gynecology

## 2016-03-05 VITALS — BP 121/62 | HR 97 | Wt 181.6 lb

## 2016-03-05 DIAGNOSIS — R109 Unspecified abdominal pain: Secondary | ICD-10-CM

## 2016-03-05 DIAGNOSIS — Z3492 Encounter for supervision of normal pregnancy, unspecified, second trimester: Secondary | ICD-10-CM

## 2016-03-05 DIAGNOSIS — O26899 Other specified pregnancy related conditions, unspecified trimester: Secondary | ICD-10-CM | POA: Insufficient documentation

## 2016-03-05 DIAGNOSIS — O9989 Other specified diseases and conditions complicating pregnancy, childbirth and the puerperium: Secondary | ICD-10-CM

## 2016-03-05 DIAGNOSIS — Z3482 Encounter for supervision of other normal pregnancy, second trimester: Secondary | ICD-10-CM

## 2016-03-05 DIAGNOSIS — R319 Hematuria, unspecified: Secondary | ICD-10-CM

## 2016-03-05 LAB — POCT URINALYSIS DIPSTICK
Bilirubin, UA: NEGATIVE
Glucose, UA: NEGATIVE
KETONES UA: NEGATIVE
Nitrite, UA: NEGATIVE
PH UA: 6.5
PROTEIN UA: NEGATIVE
SPEC GRAV UA: 1.015
UROBILINOGEN UA: NEGATIVE

## 2016-03-05 MED ORDER — PANTOPRAZOLE SODIUM 20 MG PO TBEC: 20.0000 mg | DELAYED_RELEASE_TABLET | Freq: Two times a day (BID) | ORAL | 4 refills | Status: DC

## 2016-03-05 NOTE — Progress Notes (Signed)
[name]: Patient reports heartburn, OTC Zantac not helping, prescribed Protonix 20 mg BID. Mild intermittent lower abdominal cramping and pressure without bleeding, advised on Tylenol, warm baths, pregnancy girdle. Hematuria noted on UA (but neg for nitrates, leukocytes), will send for culture.For 28 week labs next visit.

## 2016-03-07 ENCOUNTER — Telehealth: Payer: Self-pay | Admitting: Obstetrics and Gynecology

## 2016-03-07 LAB — URINE CULTURE

## 2016-03-07 NOTE — Telephone Encounter (Signed)
PT CALLED AND SHE WANTING TO KNOW IF THE RESULTS FROM HER URINE BEING SENT OFF WAS BACK, SHE WOULD LIKE A CALL BACK EITHER WAY.

## 2016-03-08 NOTE — Telephone Encounter (Signed)
Called pt informed her of negative urine cx results.  

## 2016-03-21 ENCOUNTER — Telehealth: Payer: Self-pay | Admitting: Obstetrics and Gynecology

## 2016-03-21 NOTE — Telephone Encounter (Signed)
Called pt she states that last night she began having period like cramping, pt states that the pain began under her belly button and radiated to her back. Pt states that pain was so intense today she had to leave work. Pt states that she has taken tylenol with some relief. Pt notes good fetal movement and denies leaking of fluid or vaginal bleeding. Advised pt on increased hydration and es tylenol, rest, warm bath. Advised pt to seek ED if another episode occurs in the off hours, otherwise call back in the AM with update.

## 2016-03-21 NOTE — Telephone Encounter (Signed)
Pt left msg on VM,  26 wks pregant and is cramping bad and hurting in her back. She wanted to know if she should be seen here or go to hospital

## 2016-04-03 ENCOUNTER — Other Ambulatory Visit: Payer: Medicaid Other

## 2016-04-03 ENCOUNTER — Ambulatory Visit (INDEPENDENT_AMBULATORY_CARE_PROVIDER_SITE_OTHER): Payer: Medicaid Other | Admitting: Obstetrics and Gynecology

## 2016-04-03 VITALS — BP 107/67 | HR 83 | Wt 188.6 lb

## 2016-04-03 DIAGNOSIS — Z23 Encounter for immunization: Secondary | ICD-10-CM | POA: Diagnosis not present

## 2016-04-03 DIAGNOSIS — Z3483 Encounter for supervision of other normal pregnancy, third trimester: Secondary | ICD-10-CM

## 2016-04-03 DIAGNOSIS — Z131 Encounter for screening for diabetes mellitus: Secondary | ICD-10-CM

## 2016-04-03 LAB — POCT URINALYSIS DIPSTICK
Bilirubin, UA: NEGATIVE
Blood, UA: NEGATIVE
Glucose, UA: NEGATIVE
Ketones, UA: NEGATIVE
Leukocytes, UA: NEGATIVE
NITRITE UA: NEGATIVE
PROTEIN UA: NEGATIVE
SPEC GRAV UA: 1.015
UROBILINOGEN UA: NEGATIVE
pH, UA: 7

## 2016-04-03 MED ORDER — TETANUS-DIPHTH-ACELL PERTUSSIS 5-2.5-18.5 LF-MCG/0.5 IM SUSP
0.5000 mL | Freq: Once | INTRAMUSCULAR | Status: AC
  Administered 2016-04-03: 0.5 mL via INTRAMUSCULAR

## 2016-04-03 NOTE — Progress Notes (Signed)
[name]: Denies complaints. Doing well.  For 28 week labs today.  Desires to bottle feed, discussed benefits of breastfeeding. Unsure of desires for contraception (but considering patch). For Tdap today, signed blood consent, discussed cord blood banking. Declined flu vaccine.

## 2016-04-03 NOTE — Patient Instructions (Signed)
Tdap Vaccine (Tetanus, Diphtheria and Pertussis): What You Need to Know 1. Why get vaccinated? Tetanus, diphtheria and pertussis are very serious diseases. Tdap vaccine can protect us from these diseases. And, Tdap vaccine given to pregnant women can protect newborn babies against pertussis. TETANUS (Lockjaw) is rare in the [place] today. It causes painful muscle tightening and stiffness, usually all over the body.  It can lead to tightening of muscles in the head and neck so you can't open your mouth, swallow, or sometimes even breathe. Tetanus kills about 1 out of 10 people who are infected even after receiving the best medical care. DIPHTHERIA is also rare in the [place] today. It can cause a thick coating to form in the back of the throat.  It can lead to breathing problems, heart failure, paralysis, and death. PERTUSSIS (Whooping Cough) causes severe coughing spells, which can cause difficulty breathing, vomiting and disturbed sleep.  It can also lead to weight loss, incontinence, and rib fractures. Up to 2 in 100 adolescents and 5 in 100 adults with pertussis are hospitalized or have complications, which could include pneumonia or death. These diseases are caused by bacteria. Diphtheria and pertussis are spread from person to person through secretions from coughing or sneezing. Tetanus enters the body through cuts, scratches, or wounds. Before vaccines, as many as 200,000 cases of diphtheria, 200,000 cases of pertussis, and hundreds of cases of tetanus, were reported in the [place] each year. Since vaccination began, reports of cases for tetanus and diphtheria have dropped by about 99% and for pertussis by about 80%. 2. Tdap vaccine Tdap vaccine can protect adolescents and adults from tetanus, diphtheria, and pertussis. One dose of Tdap is routinely given at age 11 or 12. People who did not get Tdap at that age should get it as soon as possible. Tdap is especially important  for healthcare professionals and anyone having close contact with a baby younger than 12 months. Pregnant women should get a dose of Tdap during every pregnancy, to protect the newborn from pertussis. Infants are most at risk for severe, life-threatening complications from pertussis. Another vaccine, called Td, protects against tetanus and diphtheria, but not pertussis. A Td booster should be given every 10 years. Tdap may be given as one of these boosters if you have never gotten Tdap before. Tdap may also be given after a severe cut or burn to prevent tetanus infection. Your doctor or the person giving you the vaccine can give you more information. Tdap may safely be given at the same time as other vaccines. 3. Some people should not get this vaccine  A person who has ever had a life-threatening allergic reaction after a previous dose of any diphtheria, tetanus or pertussis containing vaccine, OR has a severe allergy to any part of this vaccine, should not get Tdap vaccine. Tell the person giving the vaccine about any severe allergies.  Anyone who had coma or long repeated seizures within 7 days after a childhood dose of DTP or DTaP, or a previous dose of Tdap, should not get Tdap, unless a cause other than the vaccine was found. They can still get Td.  Talk to your doctor if you:  have seizures or another nervous system problem,  had severe pain or swelling after any vaccine containing diphtheria, tetanus or pertussis,  ever had a condition called Guillain-Barr Syndrome (GBS),  aren't feeling well on the day the shot is scheduled. 4. Risks With any medicine, including vaccines, there is   a chance of side effects. These are usually mild and go away on their own. Serious reactions are also possible but are rare. Most people who get Tdap vaccine do not have any problems with it. Mild problems following Tdap (Did not interfere with activities)  Pain where the shot was given (about 3 in 4  adolescents or 2 in 3 adults)  Redness or swelling where the shot was given (about 1 person in 5)  Mild fever of at least 100.4F (up to about 1 in 25 adolescents or 1 in 100 adults)  Headache (about 3 or 4 people in 10)  Tiredness (about 1 person in 3 or 4)  Nausea, vomiting, diarrhea, stomach ache (up to 1 in 4 adolescents or 1 in 10 adults)  Chills, sore joints (about 1 person in 10)  Body aches (about 1 person in 3 or 4)  Rash, swollen glands (uncommon) Moderate problems following Tdap (Interfered with activities, but did not require medical attention)  Pain where the shot was given (up to 1 in 5 or 6)  Redness or swelling where the shot was given (up to about 1 in 16 adolescents or 1 in 12 adults)  Fever over 102F (about 1 in 100 adolescents or 1 in 250 adults)  Headache (about 1 in 7 adolescents or 1 in 10 adults)  Nausea, vomiting, diarrhea, stomach ache (up to 1 or 3 people in 100)  Swelling of the entire arm where the shot was given (up to about 1 in 500). Severe problems following Tdap (Unable to perform usual activities; required medical attention)  Swelling, severe pain, bleeding and redness in the arm where the shot was given (rare). Problems that could happen after any vaccine:  People sometimes faint after a medical procedure, including vaccination. Sitting or lying down for about 15 minutes can help prevent fainting, and injuries caused by a fall. Tell your doctor if you feel dizzy, or have vision changes or ringing in the ears.  Some people get severe pain in the shoulder and have difficulty moving the arm where a shot was given. This happens very rarely.  Any medication can cause a severe allergic reaction. Such reactions from a vaccine are very rare, estimated at fewer than 1 in a million doses, and would happen within a few minutes to a few hours after the vaccination. As with any medicine, there is a very remote chance of a vaccine causing a serious  injury or death. The safety of vaccines is always being monitored. For more information, visit: www.cdc.gov/vaccinesafety/ 5. What if there is a serious problem? What should I look for?  Look for anything that concerns you, such as signs of a severe allergic reaction, very high fever, or unusual behavior.  Signs of a severe allergic reaction can include hives, swelling of the face and throat, difficulty breathing, a fast heartbeat, dizziness, and weakness. These would usually start a few minutes to a few hours after the vaccination. What should I do?  If you think it is a severe allergic reaction or other emergency that can't wait, call 9-1-1 or get the person to the nearest hospital. Otherwise, call your doctor.  Afterward, the reaction should be reported to the Vaccine Adverse Event Reporting System (VAERS). Your doctor might file this report, or you can do it yourself through the VAERS web site at www.vaers.hhs.gov, or by calling 1-800-822-7967. VAERS does not give medical advice.  6. The National Vaccine Injury Compensation Program The National Vaccine Injury Compensation Program (  VICP) is a federal program that was created to compensate people who may have been injured by certain vaccines. Persons who believe they may have been injured by a vaccine can learn about the program and about filing a claim by calling 1-800-338-2382 or visiting the VICP website at www.hrsa.gov/vaccinecompensation. There is a time limit to file a claim for compensation. 7. How can I learn more?  Ask your doctor. He or she can give you the vaccine package insert or suggest other sources of information.  Call your local or state health department.  Contact the Centers for Disease Control and Prevention (CDC):  Call 1-800-232-4636 (1-800-CDC-INFO) or  Visit CDC's website at www.cdc.gov/vaccines CDC Tdap Vaccine VIS (08/24/13)   This information is not intended to replace advice given to you by your health care  provider. Make sure you discuss any questions you have with your health care provider.   Document Released: 12/17/2011 Document Revised: 07/08/2014 Document Reviewed: 09/29/2013 Elsevier Interactive Patient Education 2016 Elsevier Inc.  

## 2016-04-03 NOTE — Progress Notes (Signed)
Tdap injection given. Pt declines flu vaccine.

## 2016-04-04 LAB — GLUCOSE, 1 HOUR GESTATIONAL: Gestational Diabetes Screen: 78 mg/dL (ref 65–139)

## 2016-04-04 LAB — HEMOGLOBIN AND HEMATOCRIT, BLOOD
Hematocrit: 29.9 % — ABNORMAL LOW (ref 34.0–46.6)
Hemoglobin: 9.6 g/dL — ABNORMAL LOW (ref 11.1–15.9)

## 2016-04-17 ENCOUNTER — Ambulatory Visit (INDEPENDENT_AMBULATORY_CARE_PROVIDER_SITE_OTHER): Payer: Medicaid Other | Admitting: Obstetrics and Gynecology

## 2016-04-17 VITALS — BP 107/70 | HR 101 | Wt 190.0 lb

## 2016-04-17 DIAGNOSIS — R12 Heartburn: Secondary | ICD-10-CM

## 2016-04-17 DIAGNOSIS — Z3483 Encounter for supervision of other normal pregnancy, third trimester: Secondary | ICD-10-CM

## 2016-04-17 DIAGNOSIS — O26893 Other specified pregnancy related conditions, third trimester: Secondary | ICD-10-CM

## 2016-04-17 DIAGNOSIS — O99013 Anemia complicating pregnancy, third trimester: Secondary | ICD-10-CM

## 2016-04-17 LAB — POCT URINALYSIS DIPSTICK
BILIRUBIN UA: NEGATIVE
GLUCOSE UA: NEGATIVE
Ketones, UA: NEGATIVE
Nitrite, UA: NEGATIVE
Protein, UA: NEGATIVE
SPEC GRAV UA: 1.02
UROBILINOGEN UA: NEGATIVE
pH, UA: 8

## 2016-04-17 MED ORDER — OMEPRAZOLE 20 MG PO CPDR: 20.0000 mg | DELAYED_RELEASE_CAPSULE | Freq: Two times a day (BID) | ORAL | 3 refills | Status: DC

## 2016-04-17 MED ORDER — FERROUS SULFATE 325 (65 FE) MG PO TABS: 325.0000 mg | ORAL_TABLET | Freq: Every day | ORAL | 1 refills | Status: DC

## 2016-04-17 NOTE — Progress Notes (Signed)
[name]: Notes worsening heart burn despite use of Protonix (increased to 40 mg BID).  Will change to Prilosec 20 mg BID.  Normal glucola, mild anemia on labs, prescribed daily iron tablets.  RTC in 2 weeks.

## 2016-05-01 ENCOUNTER — Ambulatory Visit (INDEPENDENT_AMBULATORY_CARE_PROVIDER_SITE_OTHER): Payer: Medicaid Other | Admitting: Obstetrics and Gynecology

## 2016-05-01 VITALS — BP 107/66 | HR 85 | Wt 193.2 lb

## 2016-05-01 DIAGNOSIS — G8929 Other chronic pain: Secondary | ICD-10-CM

## 2016-05-01 DIAGNOSIS — Z3483 Encounter for supervision of other normal pregnancy, third trimester: Secondary | ICD-10-CM

## 2016-05-01 DIAGNOSIS — O99012 Anemia complicating pregnancy, second trimester: Secondary | ICD-10-CM

## 2016-05-01 DIAGNOSIS — M546 Pain in thoracic spine: Secondary | ICD-10-CM

## 2016-05-01 LAB — POCT URINALYSIS DIPSTICK
BILIRUBIN UA: NEGATIVE
Blood, UA: NEGATIVE
GLUCOSE UA: NEGATIVE
KETONES UA: NEGATIVE
Nitrite, UA: NEGATIVE
SPEC GRAV UA: 1.025
Urobilinogen, UA: NEGATIVE
pH, UA: 6

## 2016-05-01 NOTE — Progress Notes (Signed)
[name]: Patient c/o lower back pain and pelvic pressure. Also notes feeling winded at times. Discussed warm baths, pregnancy belt. Will repeat CBC at 36 weeks to reassess anemia.  RTC in 2 weeks.

## 2016-05-02 NOTE — Addendum Note (Signed)
Addended by: Frankey ShownGLANTON, Loran Auguste C on: 05/02/2016 09:23 AM   Modules accepted: Orders

## 2016-05-03 LAB — URINE CULTURE

## 2016-05-04 ENCOUNTER — Encounter: Payer: Self-pay | Admitting: *Deleted

## 2016-05-04 ENCOUNTER — Observation Stay
Admission: EM | Admit: 2016-05-04 | Discharge: 2016-05-04 | Disposition: A | Discharge status: Home / Self Care | Payer: BC Managed Care – PPO | Attending: Obstetrics and Gynecology | Admitting: Obstetrics and Gynecology

## 2016-05-04 DIAGNOSIS — O99891 Other specified diseases and conditions complicating pregnancy: Secondary | ICD-10-CM | POA: Diagnosis present

## 2016-05-04 DIAGNOSIS — O2693 Pregnancy related conditions, unspecified, third trimester: Secondary | ICD-10-CM | POA: Diagnosis present

## 2016-05-04 DIAGNOSIS — Z3A34 34 weeks gestation of pregnancy: Secondary | ICD-10-CM | POA: Insufficient documentation

## 2016-05-04 DIAGNOSIS — O9989 Other specified diseases and conditions complicating pregnancy, childbirth and the puerperium: Secondary | ICD-10-CM

## 2016-05-04 DIAGNOSIS — O26899 Other specified pregnancy related conditions, unspecified trimester: Secondary | ICD-10-CM | POA: Diagnosis not present

## 2016-05-04 DIAGNOSIS — M545 Low back pain: Secondary | ICD-10-CM | POA: Insufficient documentation

## 2016-05-04 DIAGNOSIS — M549 Dorsalgia, unspecified: Secondary | ICD-10-CM | POA: Diagnosis not present

## 2016-05-04 DIAGNOSIS — O26893 Other specified pregnancy related conditions, third trimester: Secondary | ICD-10-CM | POA: Diagnosis not present

## 2016-05-04 NOTE — Final Progress Note (Signed)
L&D OB Triage Note  Teresa GammonChelsea L Ortiz is a 25 y.o. 172P1001 female at 6259w0d, EDD Estimated Date of Delivery: 06/15/16 who presented to triage for complaints of low back pain, possible ROM.  Notes taking She was evaluated by the nurses with no significant findings for ruptured membranes or contractions. Vital signs stable. An NST was performed and has been reviewed by MD.   NST INTERPRETATION: Indications: rule out uterine contractions  Mode: External Baseline Rate (A): 125 bpm Variability: Moderate Accelerations: 15 x 15 Decelerations: None     Contraction Frequency (min): ui noted  Impression: reactive   Plan: NST performed was reviewed and was found to be reactive.  No contractions noted. Discussed using heating pads, pregnancy girdle, continue ES Tylenol.  Can prescribe T#3 if needed. She was discharged home with bleeding/labor precautions.  Continue routine prenatal care. Follow up with OB/GYN as previously scheduled.     Hildred LaserAnika Zygmunt Mcglinn, MD [facility]

## 2016-05-04 NOTE — OB Triage Note (Signed)
C/O possible SROM @ 0930 this am. Went to bathroom and underwear were wet. Not having to wear a pad currently. Went to bathroom numerous times after the first incident and reports panties continue to be wet. C/O lower abdominal pain occurring "all week" worsening this am while @ work. Reports being on feet a lot while at work. Works @ [employer]. Seen in OB office this past Wednesday. Teresa Ortiz, Neelam Tiggs S

## 2016-05-04 NOTE — Discharge Instructions (Signed)

## 2016-05-15 ENCOUNTER — Ambulatory Visit (INDEPENDENT_AMBULATORY_CARE_PROVIDER_SITE_OTHER): Payer: Medicaid Other | Admitting: Obstetrics and Gynecology

## 2016-05-15 VITALS — BP 107/68 | HR 112 | Wt 195.7 lb

## 2016-05-15 DIAGNOSIS — Z3685 Encounter for antenatal screening for Streptococcus B: Secondary | ICD-10-CM

## 2016-05-15 DIAGNOSIS — Z3483 Encounter for supervision of other normal pregnancy, third trimester: Secondary | ICD-10-CM

## 2016-05-15 DIAGNOSIS — Z113 Encounter for screening for infections with a predominantly sexual mode of transmission: Secondary | ICD-10-CM

## 2016-05-15 DIAGNOSIS — O99013 Anemia complicating pregnancy, third trimester: Secondary | ICD-10-CM | POA: Insufficient documentation

## 2016-05-15 DIAGNOSIS — O99012 Anemia complicating pregnancy, second trimester: Secondary | ICD-10-CM

## 2016-05-15 LAB — OB RESULTS CONSOLE GBS: GBS: NEGATIVE

## 2016-05-15 LAB — POCT URINALYSIS DIPSTICK
Bilirubin, UA: NEGATIVE
Glucose, UA: NEGATIVE
Ketones, UA: NEGATIVE
Nitrite, UA: NEGATIVE
PH UA: 7.5
PROTEIN UA: NEGATIVE
RBC UA: NEGATIVE
SPEC GRAV UA: 1.01
UROBILINOGEN UA: NEGATIVE

## 2016-05-15 NOTE — Progress Notes (Signed)
[name]: Patient doing better, notes cutting her shifts at her job which has helped her pain.  36 week labs done today, for repeat CBC.  RTC in 2 weeks.

## 2016-05-15 NOTE — Addendum Note (Signed)
Addended by: Lunette StandsSIEMIENSKI, Brooklynne Pereida J on: 05/15/2016 09:18 AM   Modules accepted: Orders

## 2016-05-16 LAB — CBC
Hematocrit: 30 % — ABNORMAL LOW (ref 34.0–46.6)
Hemoglobin: 9.6 g/dL — ABNORMAL LOW (ref 11.1–15.9)
MCH: 24.8 pg — ABNORMAL LOW (ref 26.6–33.0)
MCHC: 32 g/dL (ref 31.5–35.7)
MCV: 78 fL — AB (ref 79–97)
PLATELETS: 207 10*3/uL (ref 150–379)
RBC: 3.87 x10E6/uL (ref 3.77–5.28)
RDW: 16.4 % — AB (ref 12.3–15.4)
WBC: 8 10*3/uL (ref 3.4–10.8)

## 2016-05-16 LAB — GC/CHLAMYDIA PROBE AMP
Chlamydia trachomatis, NAA: NEGATIVE
Neisseria gonorrhoeae by PCR: NEGATIVE

## 2016-05-17 ENCOUNTER — Telehealth: Payer: Self-pay

## 2016-05-17 DIAGNOSIS — O99019 Anemia complicating pregnancy, unspecified trimester: Secondary | ICD-10-CM

## 2016-05-17 MED ORDER — FUSION PLUS PO CAPS: 130.0000 mg | ORAL_CAPSULE | Freq: Two times a day (BID) | ORAL | 3 refills | Status: DC

## 2016-05-17 NOTE — Telephone Encounter (Signed)
-----   Message from Hildred LaserAnika Cherry, MD sent at 05/16/2016  5:26 PM EST ----- Patient still with anemia (has not changed since last checked).  Please reiterate taking iron supplementation twice daily.

## 2016-05-17 NOTE — Telephone Encounter (Signed)
Called pt no answer LM for pt advising her to take iron supplement twice daily.

## 2016-05-19 LAB — CULTURE, BETA STREP (GROUP B ONLY): STREP GP B CULTURE: NEGATIVE

## 2016-05-28 ENCOUNTER — Emergency Department
Admission: EM | Admit: 2016-05-28 | Discharge: 2016-05-28 | Disposition: A | Discharge status: Home / Self Care | Payer: BC Managed Care – PPO | Attending: Emergency Medicine | Admitting: Emergency Medicine

## 2016-05-28 ENCOUNTER — Encounter: Payer: Self-pay | Admitting: Emergency Medicine

## 2016-05-28 DIAGNOSIS — J069 Acute upper respiratory infection, unspecified: Secondary | ICD-10-CM | POA: Diagnosis not present

## 2016-05-28 DIAGNOSIS — B9789 Other viral agents as the cause of diseases classified elsewhere: Secondary | ICD-10-CM

## 2016-05-28 DIAGNOSIS — Z3A37 37 weeks gestation of pregnancy: Secondary | ICD-10-CM | POA: Diagnosis not present

## 2016-05-28 DIAGNOSIS — R112 Nausea with vomiting, unspecified: Secondary | ICD-10-CM

## 2016-05-28 DIAGNOSIS — O99513 Diseases of the respiratory system complicating pregnancy, third trimester: Secondary | ICD-10-CM | POA: Insufficient documentation

## 2016-05-28 DIAGNOSIS — J45909 Unspecified asthma, uncomplicated: Secondary | ICD-10-CM | POA: Insufficient documentation

## 2016-05-28 DIAGNOSIS — O219 Vomiting of pregnancy, unspecified: Secondary | ICD-10-CM | POA: Diagnosis present

## 2016-05-28 LAB — COMPREHENSIVE METABOLIC PANEL
ALBUMIN: 3.2 g/dL — AB (ref 3.5–5.0)
ALK PHOS: 67 U/L (ref 38–126)
ALT: 19 U/L (ref 14–54)
AST: 19 U/L (ref 15–41)
Anion gap: 10 (ref 5–15)
BILIRUBIN TOTAL: 0.3 mg/dL (ref 0.3–1.2)
BUN: 11 mg/dL (ref 6–20)
CALCIUM: 9 mg/dL (ref 8.9–10.3)
CO2: 22 mmol/L (ref 22–32)
CREATININE: 0.6 mg/dL (ref 0.44–1.00)
Chloride: 103 mmol/L (ref 101–111)
GFR calc Af Amer: >60 mL/min (ref >60)
GLUCOSE: 91 mg/dL (ref 65–99)
POTASSIUM: 3.6 mmol/L (ref 3.5–5.1)
Sodium: 135 mmol/L (ref 135–145)
TOTAL PROTEIN: 7.1 g/dL (ref 6.5–8.1)

## 2016-05-28 LAB — URINALYSIS COMPLETE WITH MICROSCOPIC (ARMC ONLY)
Bacteria, UA: NONE SEEN
Bilirubin Urine: NEGATIVE
GLUCOSE, UA: NEGATIVE mg/dL
Hgb urine dipstick: NEGATIVE
KETONES UR: NEGATIVE mg/dL
NITRITE: NEGATIVE
PROTEIN: 30 mg/dL — AB
SPECIFIC GRAVITY, URINE: 1.028 (ref 1.005–1.030)
pH: 6 (ref 5.0–8.0)

## 2016-05-28 LAB — CBC
HCT: 31.5 % — ABNORMAL LOW (ref 35.0–47.0)
Hemoglobin: 10.7 g/dL — ABNORMAL LOW (ref 12.0–16.0)
MCH: 25.8 pg — ABNORMAL LOW (ref 26.0–34.0)
MCHC: 34 g/dL (ref 32.0–36.0)
MCV: 76.1 fL — ABNORMAL LOW (ref 80.0–100.0)
PLATELETS: 222 10*3/uL (ref 150–440)
RBC: 4.14 MIL/uL (ref 3.80–5.20)
RDW: 17.6 % — AB (ref 11.5–14.5)
WBC: 8.3 10*3/uL (ref 3.6–11.0)

## 2016-05-28 LAB — LIPASE, BLOOD: Lipase: 27 U/L (ref 11–51)

## 2016-05-28 MED ORDER — METOCLOPRAMIDE HCL 10 MG PO TABS
10.0000 mg | ORAL_TABLET | Freq: Once | ORAL | Status: AC
  Administered 2016-05-28: 10 mg via ORAL
  Filled 2016-05-28: qty 1

## 2016-05-28 MED ORDER — METOCLOPRAMIDE HCL 10 MG PO TABS: 10.0000 mg | ORAL_TABLET | Freq: Four times a day (QID) | ORAL | 0 refills | Status: DC | PRN

## 2016-05-28 NOTE — ED Notes (Signed)
Fetal Heart Tones are 142bpm done by [employer] Financialllison RN.

## 2016-05-28 NOTE — ED Notes (Signed)
Pt reports she has felt the baby moving as normal.

## 2016-05-28 NOTE — ED Provider Notes (Signed)
[facility] Emergency Department Provider Note  ____________________________________________   First MD Initiated Contact with Patient 05/28/16 1820     (approximate)  I have reviewed the triage vital signs and the nursing notes.   HISTORY  Chief Complaint Cough and Emesis   HPI Teresa Ortiz is a 25 y.o. female who is at 7737 weeks gestation was presenting to the emergency department today with 2 days of nasal congestion as well as cough and vomiting. She is denying any abdominal pain. Denies any vaginal discharge or bleeding. Denies any burning with urination. Denies any known sick contacts. Says that she has been feeling the baby move. Says that she also has a mild-to-moderate frontal headache but has been having headaches for her pregnancy and has been evaluated for this by her OB/GYN. Denies any production of sputum from a cough. Does not report any fevers. Says has vomited 3 times and this is what brought her to the emergency department. Was given Reglan in triage and now has been able to tolerate by mouth fluids.   Past Medical History:  Diagnosis Date  . Asthma    childhood  . Frequent headaches   . Migraine without aura     Patient Active Problem List   Diagnosis Date Noted  . Anemia of pregnancy in second trimester 05/15/2016  . Back pain in pregnancy 05/04/2016  . Cramping affecting pregnancy, antepartum 03/05/2016  . History of migraine headaches 12/25/2015  . Pregnancy 12/25/2015  . Rubella non-immune status, antepartum 12/20/2015  . Advance care planning 06/13/2014  . Migraine without aura 05/11/2014  . Back pain 05/11/2014    History reviewed. No pertinent surgical history.  Prior to Admission medications   Medication Sig Start Date End Date Taking? Authorizing Provider  acetaminophen (TYLENOL) 500 MG tablet Take 1,000 mg by mouth every 6 (six) hours as needed for mild pain.    Historical Provider, MD  ferrous sulfate  (FERROUSUL) 325 (65 FE) MG tablet Take 1 tablet (325 mg total) by mouth daily with breakfast. 04/17/16   Hildred LaserAnika Cherry, MD  Iron-FA-B Cmp-C-Biot-Probiotic (FUSION PLUS) CAPS Take 130 mg by mouth 2 (two) times daily. 05/17/16   Hildred LaserAnika Cherry, MD  omeprazole (PRILOSEC) 20 MG capsule Take 1 capsule (20 mg total) by mouth 2 (two) times daily before a meal. Patient not taking: Reported on 05/15/2016 04/17/16   Hildred LaserAnika Cherry, MD  Prenatal Vit-Fe Fumarate-FA (MULTIVITAMIN-PRENATAL) 27-0.8 MG TABS tablet Take 1 tablet by mouth daily at 12 noon.    Historical Provider, MD    Allergies Sulfa antibiotics  Family History  Problem Relation Age of Onset  . Mental illness Mother     bipolor/depressed  . Alcohol abuse Father     Social History Social History  Substance Use Topics  . Smoking status: Never Smoker  . Smokeless tobacco: Never Used  . Alcohol use 0.0 oz/week     Comment: rarely    Review of Systems Constitutional: No fever/chills Eyes: No visual changes. ENT: No sore throat. Cardiovascular: Denies chest pain. Respiratory: Denies shortness of breath. Gastrointestinal: No abdominal pain.   No diarrhea.  No constipation. Genitourinary: Negative for dysuria. Musculoskeletal: Negative for back pain. Skin: Negative for rash. Neurological: Negative for headaches, focal weakness or numbness.  10-point ROS otherwise negative.  ____________________________________________   PHYSICAL EXAM:  VITAL SIGNS: ED Triage Vitals  Enc Vitals Group     BP 05/28/16 1726 124/64     Pulse Rate 05/28/16 1726 90  Resp 05/28/16 1726 20     Temp 05/28/16 1726 98.1 F (36.7 C)     Temp Source 05/28/16 1726 Oral     SpO2 05/28/16 1726 99 %     Weight 05/28/16 1727 195 lb (88.5 kg)     Height --      Head Circumference --      Peak Flow --      Pain Score 05/28/16 1727 4     Pain Loc --      Pain Edu? --      Excl. in GC? --     Constitutional: Alert and oriented. Well appearing and in  no acute distress. Eyes: Conjunctivae are normal. PERRL. EOMI. Head: Atraumatic.No tenderness over the frontal or maxillary sinuses. Nose: Clear rhinorrhea bilaterally. Mouth/Throat: Mucous membranes are moist.  Oropharynx non-erythematous. Neck: No stridor.   Cardiovascular: Normal rate, regular rhythm. Grossly normal heart sounds.   Respiratory: Normal respiratory effort.  No retractions. Lungs CTAB. Gastrointestinal: Soft and nontender. Gravid uterus consistent with dates without any tenderness to palpation. Musculoskeletal: No lower extremity tenderness nor edema.  No joint effusions. Neurologic:  Normal speech and language. No gross focal neurologic deficits are appreciated. No gait instability. Skin:  Skin is warm, dry and intact. No rash noted. Psychiatric: Mood and affect are normal. Speech and behavior are normal.  ____________________________________________   LABS (all labs ordered are listed, but only abnormal results are displayed)  Labs Reviewed  COMPREHENSIVE METABOLIC PANEL - Abnormal; Notable for the following:       Result Value   Albumin 3.2 (*)    All other components within normal limits  CBC - Abnormal; Notable for the following:    Hemoglobin 10.7 (*)    HCT 31.5 (*)    MCV 76.1 (*)    MCH 25.8 (*)    RDW 17.6 (*)    All other components within normal limits  URINALYSIS COMPLETEWITH MICROSCOPIC ([facility] ONLY) - Abnormal; Notable for the following:    Color, Urine YELLOW (*)    APPearance CLOUDY (*)    Protein, ur 30 (*)    Leukocytes, UA TRACE (*)    Squamous Epithelial / LPF 6-30 (*)    All other components within normal limits  LIPASE, BLOOD   ____________________________________________  EKG   ____________________________________________  RADIOLOGY   ____________________________________________   PROCEDURES  Procedure(s) performed:   Procedures  Critical Care performed:   ____________________________________________   INITIAL  IMPRESSION / ASSESSMENT AND PLAN / ED COURSE  Pertinent labs & imaging results that were available during my care of the patient were reviewed by me and considered in my medical decision making (see chart for details).    Clinical Course     Likely viral illness causing the patient's symptoms. We'll discharge with several more doses of Reglan. She will follow up with her OB/GYN. ____________________________________________   FINAL CLINICAL IMPRESSION(S) / ED DIAGNOSES  Viral syndrome.    NEW MEDICATIONS STARTED DURING THIS VISIT:  New Prescriptions   No medications on file     Note:  This document was prepared using Dragon voice recognition software and may include unintentional dictation errors.    Myrna Blazeravid Matthew Lorell Thibodaux, MD 05/28/16 2005

## 2016-05-28 NOTE — ED Triage Notes (Signed)
Pt to ed with c/o cough and congestion since yesterday.  Pt also reports vomiting x 3 today, headache and difficulty breathing.

## 2016-05-30 ENCOUNTER — Ambulatory Visit (INDEPENDENT_AMBULATORY_CARE_PROVIDER_SITE_OTHER): Payer: Medicaid Other | Admitting: Obstetrics and Gynecology

## 2016-05-30 VITALS — BP 114/59 | HR 98 | Wt 196.4 lb

## 2016-05-30 DIAGNOSIS — R0989 Other specified symptoms and signs involving the circulatory and respiratory systems: Secondary | ICD-10-CM

## 2016-05-30 DIAGNOSIS — Z3483 Encounter for supervision of other normal pregnancy, third trimester: Secondary | ICD-10-CM

## 2016-05-30 LAB — POCT URINALYSIS DIPSTICK
BILIRUBIN UA: NEGATIVE
Blood, UA: NEGATIVE
Glucose, UA: NEGATIVE
KETONES UA: NEGATIVE
LEUKOCYTES UA: NEGATIVE
Nitrite, UA: NEGATIVE
PH UA: 7
Protein, UA: NEGATIVE
Spec Grav, UA: 1.015
Urobilinogen, UA: NEGATIVE

## 2016-05-30 NOTE — Progress Notes (Signed)
[name]: URI symptoms, taking Sudafed but not really helping. Advised on mucinex, saline spray. Seen in ER for SOB, diagnosed with URI.  Labor precautions given.

## 2016-05-31 ENCOUNTER — Encounter: Payer: Self-pay | Admitting: *Deleted

## 2016-05-31 ENCOUNTER — Inpatient Hospital Stay
Admission: EM | Admit: 2016-05-31 | Discharge: 2016-06-02 | DRG: 775 | Disposition: A | Discharge status: Home / Self Care | Payer: Medicaid Other | Attending: Obstetrics and Gynecology | Admitting: Obstetrics and Gynecology

## 2016-05-31 DIAGNOSIS — O4202 Full-term premature rupture of membranes, onset of labor within 24 hours of rupture: Secondary | ICD-10-CM | POA: Diagnosis present

## 2016-05-31 DIAGNOSIS — O9902 Anemia complicating childbirth: Secondary | ICD-10-CM | POA: Diagnosis present

## 2016-05-31 DIAGNOSIS — Z3A37 37 weeks gestation of pregnancy: Secondary | ICD-10-CM | POA: Diagnosis not present

## 2016-05-31 DIAGNOSIS — Z283 Underimmunization status: Secondary | ICD-10-CM

## 2016-05-31 DIAGNOSIS — O99013 Anemia complicating pregnancy, third trimester: Secondary | ICD-10-CM | POA: Diagnosis present

## 2016-05-31 DIAGNOSIS — O4290 Premature rupture of membranes, unspecified as to length of time between rupture and onset of labor, unspecified weeks of gestation: Secondary | ICD-10-CM | POA: Diagnosis present

## 2016-05-31 DIAGNOSIS — Z3483 Encounter for supervision of other normal pregnancy, third trimester: Secondary | ICD-10-CM | POA: Diagnosis not present

## 2016-05-31 DIAGNOSIS — D649 Anemia, unspecified: Secondary | ICD-10-CM | POA: Diagnosis present

## 2016-05-31 DIAGNOSIS — O9989 Other specified diseases and conditions complicating pregnancy, childbirth and the puerperium: Secondary | ICD-10-CM

## 2016-05-31 DIAGNOSIS — O09899 Supervision of other high risk pregnancies, unspecified trimester: Secondary | ICD-10-CM

## 2016-05-31 LAB — CBC
HCT: 30.6 % — ABNORMAL LOW (ref 35.0–47.0)
HEMOGLOBIN: 10.3 g/dL — AB (ref 12.0–16.0)
MCH: 25.8 pg — AB (ref 26.0–34.0)
MCHC: 33.8 g/dL (ref 32.0–36.0)
MCV: 76.3 fL — AB (ref 80.0–100.0)
Platelets: 241 10*3/uL (ref 150–440)
RBC: 4.01 MIL/uL (ref 3.80–5.20)
RDW: 17.1 % — ABNORMAL HIGH (ref 11.5–14.5)
WBC: 9.3 10*3/uL (ref 3.6–11.0)

## 2016-05-31 LAB — TYPE AND SCREEN
ABO/RH(D): O POS
Antibody Screen: NEGATIVE

## 2016-05-31 LAB — RAPID HIV SCREEN (HIV 1/2 AB+AG)
HIV 1/2 Antibodies: NONREACTIVE
HIV-1 P24 ANTIGEN - HIV24: NONREACTIVE

## 2016-05-31 MED ORDER — OXYTOCIN 10 UNIT/ML IJ SOLN
INTRAMUSCULAR | Status: AC
  Filled 2016-05-31: qty 2

## 2016-05-31 MED ORDER — OXYTOCIN 40 UNITS IN LACTATED RINGERS INFUSION - SIMPLE MED
INTRAVENOUS | Status: AC
  Administered 2016-06-01: 500 mL via INTRAVENOUS
  Filled 2016-05-31: qty 1000

## 2016-05-31 MED ORDER — OXYCODONE-ACETAMINOPHEN 5-325 MG PO TABS: 1.0000 | ORAL_TABLET | ORAL | Status: DC | PRN

## 2016-05-31 MED ORDER — MISOPROSTOL 200 MCG PO TABS
ORAL_TABLET | ORAL | Status: AC
  Filled 2016-05-31: qty 4

## 2016-05-31 MED ORDER — OXYTOCIN 40 UNITS IN LACTATED RINGERS INFUSION - SIMPLE MED: 2.5000 [IU]/h | INTRAVENOUS | Status: DC

## 2016-05-31 MED ORDER — AMMONIA AROMATIC IN INHA
RESPIRATORY_TRACT | Status: AC
  Filled 2016-05-31: qty 10

## 2016-05-31 MED ORDER — OXYCODONE-ACETAMINOPHEN 5-325 MG PO TABS: 2.0000 | ORAL_TABLET | ORAL | Status: DC | PRN

## 2016-05-31 MED ORDER — LACTATED RINGERS IV SOLN
INTRAVENOUS | Status: DC
  Administered 2016-05-31: 18:00:00 via INTRAVENOUS

## 2016-05-31 MED ORDER — ACETAMINOPHEN 325 MG PO TABS: 650.0000 mg | ORAL_TABLET | ORAL | Status: DC | PRN

## 2016-05-31 MED ORDER — ONDANSETRON HCL 4 MG/2ML IJ SOLN
4.0000 mg | Freq: Four times a day (QID) | INTRAMUSCULAR | Status: DC | PRN
  Administered 2016-05-31: 4 mg via INTRAVENOUS
  Filled 2016-05-31: qty 2

## 2016-05-31 MED ORDER — BUTORPHANOL TARTRATE 1 MG/ML IJ SOLN
1.0000 mg | INTRAMUSCULAR | Status: DC | PRN
  Administered 2016-05-31: 1 mg via INTRAVENOUS
  Filled 2016-05-31 (×2): qty 1

## 2016-05-31 MED ORDER — LIDOCAINE HCL (PF) 1 % IJ SOLN
INTRAMUSCULAR | Status: AC
  Filled 2016-05-31: qty 30

## 2016-05-31 MED ORDER — SOD CITRATE-CITRIC ACID 500-334 MG/5ML PO SOLN
30.0000 mL | ORAL | Status: DC | PRN
Start: 2016-05-31 — End: 2016-06-01

## 2016-05-31 MED ORDER — LACTATED RINGERS IV SOLN
500.0000 mL | INTRAVENOUS | Status: DC | PRN
Start: 2016-05-31 — End: 2016-06-01

## 2016-05-31 MED ORDER — OXYTOCIN BOLUS FROM INFUSION
500.0000 mL | Freq: Once | INTRAVENOUS | Status: AC
  Administered 2016-06-01: 500 mL via INTRAVENOUS

## 2016-05-31 MED ORDER — LIDOCAINE HCL (PF) 1 % IJ SOLN: 30.0000 mL | INTRAMUSCULAR | Status: DC | PRN

## 2016-05-31 NOTE — H&P (Signed)
Obstetric History and Physical  Sharmane L Precious BardSnipes is a 25 y.o. G2P1001 with IUP at 10538w6d presenting for PROM since ~ 4:00 p.m. Patient states she has been having  occasional contractions, minimal vaginal bleeding, intact membranes, with active fetal movement.    Prenatal Course Source of Care: [facility] with onset of care at 12 weeks Pregnancy complications or risks: Patient Active Problem List   Diagnosis Date Noted  . PROM (premature rupture of membranes) 05/31/2016  . Anemia of pregnancy in third trimester 05/15/2016  . Back pain in pregnancy 05/04/2016  . Cramping affecting pregnancy, antepartum 03/05/2016  . History of migraine headaches 12/25/2015  . Pregnancy 12/25/2015  . Rubella non-immune status, antepartum 12/20/2015  . Advance care planning 06/13/2014  . Migraine without aura 05/11/2014  . Back pain 05/11/2014   She plans to breastfeed She desires [facility].   Prenatal labs and studies: ABO, Rh: O/Positive/-- (06/08 1025) Antibody: Negative (06/08 1025) Rubella: <0.90 (06/08 1025) RPR: Non Reactive (06/08 1025)  HBsAg: Negative (06/08 1025)  HIV: Non Reactive (06/08 1025)  QAS:TMHDQQIWGBS:Negative (11/15 0905) 1 hr Glucola  Normal (78) Genetic screening normal Anatomy US normal   Past Medical History:  Diagnosis Date  . Asthma    childhood  . Frequent headaches   . Migraine without aura     History reviewed. No pertinent surgical history.  OB History  Gravida Para Term Preterm AB Living  2 1 1     1   SAB TAB Ectopic Multiple Live Births          1    # Outcome Date GA Lbr Len/2nd Weight Sex Delivery Anes PTL Lv  2 Current           1 Term 2011   6 lb 6.4 oz (2.903 kg) F Vag-Spont   LIV      Social History   Social History  . Marital status: Single    Spouse name: N/A  . Number of children: N/A  . Years of education: N/A   Social History Main Topics  . Smoking status: Never Smoker  . Smokeless tobacco: Never  Used  . Alcohol use No     Comment: rarely  . Drug use: No  . Sexual activity: Yes   Other Topics Concern  . None   Social History Narrative   1 child   Lives with boyfriend and his 2 kids    Family History  Problem Relation Age of Onset  . Mental illness Mother     bipolor/depressed  . Alcohol abuse Father     Prescriptions Prior to Admission  Medication Sig Dispense Refill Last Dose  . acetaminophen (TYLENOL) 500 MG tablet Take 1,000 mg by mouth every 6 (six) hours as needed for mild pain.   Taking  . ferrous sulfate (FERROUSUL) 325 (65 FE) MG tablet Take 1 tablet (325 mg total) by mouth daily with breakfast. 60 tablet 1 Taking  . Iron-FA-B Cmp-C-Biot-Probiotic (FUSION PLUS) CAPS Take 130 mg by mouth 2 (two) times daily. 60 capsule 3 Taking  . metoCLOPramide (REGLAN) 10 MG tablet Take 1 tablet (10 mg total) by mouth every 6 (six) hours as needed. 12 tablet 0 Taking  . omeprazole (PRILOSEC) 20 MG capsule Take 1 capsule (20 mg total) by mouth 2 (two) times daily before a meal. (Patient not taking: Reported on 05/30/2016) 60 capsule 3 Not Taking  . Prenatal Vit-Fe Fumarate-FA (MULTIVITAMIN-PRENATAL) 27-0.8 MG TABS tablet Take 1 tablet by mouth  daily at 12 noon.   Taking    Allergies  Allergen Reactions  . Sulfa Antibiotics Rash    Review of Systems: Negative except for what is mentioned in HPI.  Physical Exam: BP 122/62 (BP Location: Left Arm)   Pulse 95   Temp 98.3 F (36.8 C) (Oral)   Resp 16   Ht 5\' 10"  (1.778 m)   Wt 196 lb (88.9 kg)   LMP 09/12/2015   BMI 28.12 kg/m  CONSTITUTIONAL: Well-developed, well-nourished female in no acute distress.  HENT:  Normocephalic, atraumatic, External right and left ear normal. Oropharynx is clear and moist EYES: Conjunctivae and EOM are normal. Pupils are equal, round, and reactive to light. No scleral icterus.  NECK: Normal range of motion, supple, no masses SKIN: Skin is warm and dry. No rash noted. Not diaphoretic. No  erythema. No pallor. NEUROLOGIC: Alert and oriented to person, place, and time. Normal reflexes, muscle tone coordination. No cranial nerve deficit noted. PSYCHIATRIC: Normal mood and affect. Normal behavior. Normal judgment and thought content. CARDIOVASCULAR: Normal heart rate noted, regular rhythm RESPIRATORY: Effort and breath sounds normal, no problems with respiration noted ABDOMEN: Soft, nontender, nondistended, gravid. MUSCULOSKELETAL: Normal range of motion. No edema and no tenderness. 2+ distal pulses.  Cervical Exam: Dilatation 2 cm   Effacement 20-30%   Station -2   Presentation: cephalic FHT:  Baseline rate 125 bpm   Variability moderate  Accelerations present   Decelerations none Contractions: Irregular, q 6-12 mins   Pertinent Labs/Studies:   Results for orders placed or performed during the hospital encounter of 05/31/16 (from the past 24 hour(s))  CBC     Status: Abnormal   Collection Time: 05/31/16  5:57 PM  Result Value Ref Range   WBC 9.3 3.6 - 11.0 K/uL   RBC 4.01 3.80 - 5.20 MIL/uL   Hemoglobin 10.3 (L) 12.0 - 16.0 g/dL   HCT 62.130.6 (L) 30.835.0 - 65.747.0 %   MCV 76.3 (L) 80.0 - 100.0 fL   MCH 25.8 (L) 26.0 - 34.0 pg   MCHC 33.8 32.0 - 36.0 g/dL   RDW 84.617.1 (H) 96.211.5 - 95.214.5 %   Platelets 241 150 - 440 K/uL  Type and screen [facility]     Status: None   Collection Time: 05/31/16  5:57 PM  Result Value Ref Range   ABO/RH(D) O POS    Antibody Screen NEG    Sample Expiration 06/03/2016   Rapid HIV screen (HIV 1/2 Ab+Ag) ([facility] Only)     Status: None   Collection Time: 05/31/16  5:57 PM  Result Value Ref Range   HIV-1 P24 Antigen - HIV24 NON REACTIVE NON REACTIVE   HIV 1/2 Antibodies NON REACTIVE NON REACTIVE   Interpretation (HIV Ag Ab)      A non reactive test result means that HIV 1 or HIV 2 antibodies and HIV 1 p24 antigen were not detected in the specimen.    Assessment : Mahlon GammonChelsea L Hemstreet is a 25 y.o. G2P1001 at 2430w6d being admitted for  PROM.  Plan: Labor: Expectant management.  Induction as needed, per protocol FWB: Reassuring fetal heart tracing.  GBS negative. Delivery plan: Hopeful for vaginal delivery   Hildred LaserAnika Amador Braddy, MD [facility]

## 2016-05-31 NOTE — Progress Notes (Signed)
Intrapartum Progress Note  S: Patient notes she is starting to feel her ctx, getting closer together.  O: Blood pressure 110/67, pulse 84, temperature 98.3 F (36.8 C), temperature source Oral, resp. rate 16, height $RemoveBeforeD [id number]$5\' 10"l period 09/12/2015. Gen App: NAD, mildly uncomfortable Abdomen: soft, gravid FHT: baseline 150 bpm.  Accels present.  Decels absent. moderate in degree variability.   Tocometer: contractions q 4-7 minutes Cervix: 2/40-50/-1/c/posterior/intact Extremities: Nontender, no edema.  Labs: No new labs  Assessment:  1: SIUP at 9051w6d 2. PROM 3. GBS negative  Plan:  1. Continue expectant management.  Contraction frequency increasing.  If no further change at next check, can initiate Pitocin for augmentation.  2. Continue to monitor for s/s of chorioamnionitis.     Hildred LaserAnika Ancel Easler, MD 05/31/2016 10:12 PM

## 2016-06-01 ENCOUNTER — Encounter: Payer: Self-pay | Admitting: Anesthesiology

## 2016-06-01 ENCOUNTER — Inpatient Hospital Stay: Payer: Medicaid Other | Admitting: Anesthesiology

## 2016-06-01 DIAGNOSIS — Z3483 Encounter for supervision of other normal pregnancy, third trimester: Secondary | ICD-10-CM

## 2016-06-01 LAB — RPR: RPR: NONREACTIVE

## 2016-06-01 MED ORDER — ACETAMINOPHEN 325 MG PO TABS
650.0000 mg | ORAL_TABLET | ORAL | Status: DC | PRN
Start: 2016-06-01 — End: 2016-06-02

## 2016-06-01 MED ORDER — NALBUPHINE HCL 10 MG/ML IJ SOLN: 5.0000 mg | Freq: Once | INTRAMUSCULAR | Status: DC | PRN

## 2016-06-01 MED ORDER — PRENATAL MULTIVITAMIN CH
1.0000 | ORAL_TABLET | Freq: Every day | ORAL | Status: DC
  Administered 2016-06-01 – 2016-06-02 (×2): 1 via ORAL
  Filled 2016-06-01 (×2): qty 1

## 2016-06-01 MED ORDER — LIDOCAINE HCL (PF) 1 % IJ SOLN
INTRAMUSCULAR | Status: DC | PRN
  Administered 2016-06-01: 1 mL via INTRADERMAL

## 2016-06-01 MED ORDER — DIPHENHYDRAMINE HCL 25 MG PO CAPS: 25.0000 mg | ORAL_CAPSULE | ORAL | Status: DC | PRN

## 2016-06-01 MED ORDER — BENZOCAINE-MENTHOL 20-0.5 % EX AERO
1.0000 "application " | INHALATION_SPRAY | CUTANEOUS | Status: DC | PRN
  Administered 2016-06-01: 1 via TOPICAL
  Filled 2016-06-01: qty 56

## 2016-06-01 MED ORDER — OXYTOCIN 40 UNITS IN LACTATED RINGERS INFUSION - SIMPLE MED: 1.0000 m[IU]/min | INTRAVENOUS | Status: DC

## 2016-06-01 MED ORDER — OXYTOCIN 10 UNIT/ML IJ SOLN
INTRAMUSCULAR | Status: AC
  Filled 2016-06-01: qty 2

## 2016-06-01 MED ORDER — DIBUCAINE 1 % RE OINT: 1.0000 "application " | TOPICAL_OINTMENT | RECTAL | Status: DC | PRN

## 2016-06-01 MED ORDER — LIDOCAINE-EPINEPHRINE (PF) 1.5 %-1:200000 IJ SOLN
INTRAMUSCULAR | Status: DC | PRN
  Administered 2016-06-01: 3 mL via EPIDURAL

## 2016-06-01 MED ORDER — NALOXONE HCL 0.4 MG/ML IJ SOLN: 0.4000 mg | INTRAMUSCULAR | Status: DC | PRN

## 2016-06-01 MED ORDER — BUPIVACAINE HCL (PF) 0.25 % IJ SOLN
INTRAMUSCULAR | Status: DC | PRN
  Administered 2016-06-01 (×2): 5 mL via EPIDURAL

## 2016-06-01 MED ORDER — AMMONIA AROMATIC IN INHA
RESPIRATORY_TRACT | Status: AC
  Filled 2016-06-01: qty 10

## 2016-06-01 MED ORDER — SODIUM CHLORIDE 0.9% FLUSH: 3.0000 mL | INTRAVENOUS | Status: DC | PRN

## 2016-06-01 MED ORDER — FENTANYL 2.5 MCG/ML W/ROPIVACAINE 0.2% IN NS 100 ML EPIDURAL INFUSION (ARMC-ANES)
EPIDURAL | Status: DC | PRN
  Administered 2016-06-01: 10 mL/h via EPIDURAL

## 2016-06-01 MED ORDER — DOCUSATE SODIUM 100 MG PO CAPS
100.0000 mg | ORAL_CAPSULE | Freq: Two times a day (BID) | ORAL | Status: DC
  Administered 2016-06-01 – 2016-06-02 (×2): 100 mg via ORAL
  Filled 2016-06-01 (×2): qty 1

## 2016-06-01 MED ORDER — COCONUT OIL OIL: 1.0000 "application " | TOPICAL_OIL | Status: DC | PRN

## 2016-06-01 MED ORDER — WITCH HAZEL-GLYCERIN EX PADS: 1.0000 "application " | MEDICATED_PAD | CUTANEOUS | Status: DC | PRN

## 2016-06-01 MED ORDER — TERBUTALINE SULFATE 1 MG/ML IJ SOLN: 0.2500 mg | Freq: Once | INTRAMUSCULAR | Status: DC | PRN

## 2016-06-01 MED ORDER — IBUPROFEN 800 MG PO TABS
800.0000 mg | ORAL_TABLET | Freq: Four times a day (QID) | ORAL | Status: DC
  Administered 2016-06-01 – 2016-06-02 (×5): 800 mg via ORAL
  Filled 2016-06-01 (×5): qty 1

## 2016-06-01 MED ORDER — NALBUPHINE HCL 10 MG/ML IJ SOLN: 5.0000 mg | INTRAMUSCULAR | Status: DC | PRN

## 2016-06-01 MED ORDER — ONDANSETRON HCL 4 MG PO TABS: 4.0000 mg | ORAL_TABLET | ORAL | Status: DC | PRN

## 2016-06-01 MED ORDER — MISOPROSTOL 200 MCG PO TABS
ORAL_TABLET | ORAL | Status: AC
  Filled 2016-06-01: qty 4

## 2016-06-01 MED ORDER — MEASLES, MUMPS & RUBELLA VAC ~~LOC~~ INJ
0.5000 mL | INJECTION | Freq: Once | SUBCUTANEOUS | Status: AC
  Administered 2016-06-02: 0.5 mL via SUBCUTANEOUS
  Filled 2016-06-01 (×2): qty 0.5

## 2016-06-01 MED ORDER — ZOLPIDEM TARTRATE 5 MG PO TABS
5.0000 mg | ORAL_TABLET | Freq: Every evening | ORAL | Status: DC | PRN
Start: 2016-06-01 — End: 2016-06-02

## 2016-06-01 MED ORDER — FENTANYL 2.5 MCG/ML W/ROPIVACAINE 0.2% IN NS 100 ML EPIDURAL INFUSION (ARMC-ANES): 10.0000 mL/h | EPIDURAL | Status: DC

## 2016-06-01 MED ORDER — HYDROCODONE-ACETAMINOPHEN 5-325 MG PO TABS: 1.0000 | ORAL_TABLET | ORAL | Status: DC | PRN

## 2016-06-01 MED ORDER — DIPHENHYDRAMINE HCL 50 MG/ML IJ SOLN: 12.5000 mg | INTRAMUSCULAR | Status: DC | PRN

## 2016-06-01 MED ORDER — SIMETHICONE 80 MG PO CHEW: 80.0000 mg | CHEWABLE_TABLET | ORAL | Status: DC | PRN

## 2016-06-01 MED ORDER — FENTANYL 2.5 MCG/ML W/ROPIVACAINE 0.2% IN NS 100 ML EPIDURAL INFUSION (ARMC-ANES)
EPIDURAL | Status: AC
  Filled 2016-06-01: qty 100

## 2016-06-01 MED ORDER — LIDOCAINE HCL (PF) 1 % IJ SOLN
INTRAMUSCULAR | Status: AC
  Filled 2016-06-01: qty 30

## 2016-06-01 MED ORDER — DIPHENHYDRAMINE HCL 25 MG PO CAPS: 25.0000 mg | ORAL_CAPSULE | Freq: Four times a day (QID) | ORAL | Status: DC | PRN

## 2016-06-01 MED ORDER — ONDANSETRON HCL 4 MG/2ML IJ SOLN: 4.0000 mg | INTRAMUSCULAR | Status: DC | PRN

## 2016-06-01 MED ORDER — FERROUS SULFATE 325 (65 FE) MG PO TABS
325.0000 mg | ORAL_TABLET | Freq: Two times a day (BID) | ORAL | Status: DC
  Administered 2016-06-01 – 2016-06-02 (×3): 325 mg via ORAL
  Filled 2016-06-01 (×3): qty 1

## 2016-06-01 MED ORDER — NALOXONE HCL 2 MG/2ML IJ SOSY
1.0000 ug/kg/h | PREFILLED_SYRINGE | INTRAVENOUS | Status: DC | PRN
  Filled 2016-06-01: qty 2

## 2016-06-01 NOTE — Anesthesia Procedure Notes (Signed)
Epidural Patient location during procedure: OB Start time: 06/01/2016 12:42 AM End time: 06/01/2016 12:46 AM  Staffing Anesthesiologist: Margorie JohnPISCITELLO, JOSEPH K Performed: anesthesiologist   Preanesthetic Checklist Completed: patient identified, site marked, surgical consent, pre-op evaluation, timeout performed, IV checked, risks and benefits discussed and monitors and equipment checked  Epidural Patient position: sitting Prep: Betadine Patient monitoring: heart rate, continuous pulse ox and blood pressure Approach: midline Location: L4-L5 Injection technique: LOR saline  Needle:  Needle type: Tuohy  Needle gauge: 18 G Needle length: 9 cm and 9 Needle insertion depth: 5.5 cm Catheter type: closed end flexible Catheter size: 20 Guage Catheter at skin depth: 10 cm Test dose: negative and 1.5% lidocaine with Epi 1:200 K  Assessment Sensory level: T10 Events: blood not aspirated, injection not painful, no injection resistance, negative IV test and no paresthesia  Additional Notes Pt. Evaluated and documentation done after procedure finished. Patient identified. Risks/Benefits/Options discussed with patient including but not limited to bleeding, infection, nerve damage, paralysis, failed block, incomplete pain control, headache, blood pressure changes, nausea, vomiting, reactions to medication both or allergic, itching and postpartum back pain. Confirmed with bedside nurse the patient's most recent platelet count. Confirmed with patient that they are not currently taking any anticoagulation, have any bleeding history or any family history of bleeding disorders. Patient expressed understanding and wished to proceed. All questions were answered. Sterile technique was used throughout the entire procedure. Please see nursing notes for vital signs. Test dose was given through epidural catheter and negative prior to continuing to dose epidural or start infusion. Warning signs of high block given  to the patient including shortness of breath, tingling/numbness in hands, complete motor block, or any concerning symptoms with instructions to call for help. Patient was given instructions on fall risk and not to get out of bed. All questions and concerns addressed with instructions to call with any issues or inadequate analgesia.   Patient tolerated the insertion well without complications.  Reason for block:procedure for pain

## 2016-06-01 NOTE — Anesthesia Postprocedure Evaluation (Signed)
Anesthesia Post Note  Patient: Teresa Ortiz  Procedure(s) Performed: * No procedures listed *  Patient location during evaluation: Mother Baby Anesthesia Type: Epidural Level of consciousness: awake, awake and alert and oriented Pain management: pain level controlled Vital Signs Assessment: post-procedure vital signs reviewed and stable Respiratory status: spontaneous breathing Cardiovascular status: blood pressure returned to baseline and stable Postop Assessment: no headache, no backache, adequate PO intake, no signs of nausea or vomiting and patient able to bend at knees Anesthetic complications: no    Last Vitals:  Vitals:   06/01/16 1130 06/01/16 1534  BP: 95/61 (!) 103/51  Pulse: 63 91  Resp: 16 20  Temp: 36.9 C 36.6 C    Last Pain:  Vitals:   06/01/16 1534  TempSrc: Oral  PainSc:                  [name],  [name]

## 2016-06-01 NOTE — Anesthesia Preprocedure Evaluation (Signed)
Anesthesia Evaluation  Patient identified by MRN, date of birth, ID band Patient awake    Reviewed: Allergy & Precautions, H&P , NPO status , Patient's Chart, lab work & pertinent test results  Airway Mallampati: III  TM Distance: >3 FB Neck ROM: full    Dental  (+) Poor Dentition, Chipped   Pulmonary neg shortness of breath, asthma ,    Pulmonary exam normal breath sounds clear to auscultation       Cardiovascular Exercise Tolerance: Good (-) hypertensionnegative cardio ROS Normal cardiovascular exam Rhythm:regular Rate:Normal     Neuro/Psych  Headaches,    GI/Hepatic negative GI ROS,   Endo/Other    Renal/GU   negative genitourinary   Musculoskeletal   Abdominal   Peds  Hematology negative hematology ROS (+)   Anesthesia Other Findings Past Medical History: No date: Asthma     Comment: childhood No date: Frequent headaches No date: Migraine without aura  History reviewed. No pertinent surgical history.  BMI    Body Mass Index:  28.12 kg/m      Reproductive/Obstetrics (+) Pregnancy                             Anesthesia Physical Anesthesia Plan  ASA: III  Anesthesia Plan: Epidural   Post-op Pain Management:    Induction:   Airway Management Planned:   Additional Equipment:   Intra-op Plan:   Post-operative Plan:   Informed Consent: I have reviewed the patients History and Physical, chart, labs and discussed the procedure including the risks, benefits and alternatives for the proposed anesthesia with the patient or authorized representative who has indicated his/her understanding and acceptance.     Plan Discussed with: Anesthesiologist  Anesthesia Plan Comments:         Anesthesia Quick Evaluation

## 2016-06-02 LAB — CBC
HCT: 26.8 % — ABNORMAL LOW (ref 35.0–47.0)
Hemoglobin: 9.1 g/dL — ABNORMAL LOW (ref 12.0–16.0)
MCH: 25.7 pg — ABNORMAL LOW (ref 26.0–34.0)
MCHC: 34 g/dL (ref 32.0–36.0)
MCV: 75.7 fL — AB (ref 80.0–100.0)
PLATELETS: 223 10*3/uL (ref 150–440)
RBC: 3.55 MIL/uL — AB (ref 3.80–5.20)
RDW: 17.2 % — AB (ref 11.5–14.5)
WBC: 10.8 10*3/uL (ref 3.6–11.0)

## 2016-06-02 MED ORDER — DOCUSATE SODIUM 100 MG PO CAPS: 100.0000 mg | ORAL_CAPSULE | Freq: Two times a day (BID) | ORAL | 0 refills | Status: DC

## 2016-06-02 MED ORDER — IBUPROFEN 800 MG PO TABS: 800.0000 mg | ORAL_TABLET | Freq: Three times a day (TID) | ORAL | 0 refills | Status: DC | PRN

## 2016-06-02 NOTE — Progress Notes (Signed)
Post Partum Day # 1, s/p SVD  Subjective: no complaints, up ad lib, voiding and tolerating PO  Objective: Temp:  [97.5 F (36.4 C)-98.5 F (36.9 C)] 98.5 F (36.9 C) (12/03 0800) Pulse Rate:  [52-117] 60 (12/03 0800) Resp:  [16-20] 16 (12/03 0800) BP: (86-105)/(48-65) 104/52 (12/03 0800) SpO2:  [98 %-100 %] 99 % (12/03 0800)  Physical Exam:  General: alert and no distress  Lungs: clear to auscultation bilaterally Breasts: normal appearance, no masses or tenderness Heart: regular rate and rhythm, S1, S2 normal, no murmur, click, rub or gallop Pelvis: Lochia: appropriate, Uterine Fundus: firm Extremities: DVT Evaluation: No evidence of DVT seen on physical exam. Negative Homan's sign. No cords or calf tenderness. No significant calf/ankle edema.   Recent Labs  05/31/16 1757 06/02/16 0507  HGB 10.3* 9.1*  HCT 30.6* 26.8*    Assessment/Plan: Discharge home, Circumcision prior to discharge and Contraception [facility] patches.  Bottle feeding.  Discussed benefits again of breastfeeding.  Mild anemia postpartum, asymptomatic. Will treat with PO iron supplementation.  LOS: 2 days    Hildred LaserAnika Adonai Selsor, MD [facility]

## 2016-06-02 NOTE — Discharge Instructions (Signed)

## 2016-06-02 NOTE — Progress Notes (Signed)
Discharge instructions given. Patient verbalizes understanding of teaching. Patient discharged home at 1400. 

## 2016-06-02 NOTE — Discharge Summary (Addendum)
Obstetric Discharge Summary Reason for Admission: rupture of membranes Prenatal Procedures: ultrasound Intrapartum Procedures: spontaneous vaginal delivery Postpartum Procedures: Rubella Ig Complications-Operative and Postpartum: none Hemoglobin  Date Value Ref Range Status  06/02/2016 9.1 (L) 12.0 - 16.0 g/dL Final   HCT  Date Value Ref Range Status  06/02/2016 26.8 (L) 35.0 - 47.0 % Final   Hematocrit  Date Value Ref Range Status  05/15/2016 30.0 (L) 34.0 - 46.6 % Final    Physical Exam:  Blood pressure (!) 104/52, pulse 60, temperature 98.5 F (36.9 C), temperature source Oral, resp. rate 16, height 5\' 10"  (1.778 m), weight 196 lb (88.9 kg), last menstrual period 09/12/2015, SpO2 99 %, unknown if currently breastfeeding.  General: alert and no distress Lochia: appropriate Uterine Fundus: firm Incision: none DVT Evaluation: No evidence of DVT seen on physical exam. Negative Homan's sign. No cords or calf tenderness. No significant calf/ankle edema.  Discharge Diagnoses: Term Pregnancy-delivered, postpartum anemia (mild)  Discharge Information: Date: 06/02/2016 Activity: pelvic rest Diet: routine Medications: PNV, Ibuprofen, Colace and Iron Condition: stable Instructions: refer to practice specific booklet Discharge to: home   Follow-up Information    Hildred LaserAnika Yohance Hathorne, MD Follow up in 6 week(s).   Specialties:  Obstetrics and Gynecology, Radiology Why:  Postpartum appointment Contact information: [address] [place] [address] [phone]            Newborn Data: Live born female  Birth Weight: 6 lb 1 oz (2750 g) APGAR: 8, 9  Home with mother.  Hildred Lasernika Brailen Macneal 06/02/2016, 11:12 AM

## 2016-06-06 ENCOUNTER — Encounter: Payer: Medicaid Other | Admitting: Obstetrics and Gynecology

## 2016-06-13 ENCOUNTER — Encounter: Payer: Medicaid Other | Admitting: Obstetrics and Gynecology

## 2016-06-20 ENCOUNTER — Encounter: Payer: Medicaid Other | Admitting: Obstetrics and Gynecology

## 2016-07-08 NOTE — Progress Notes (Signed)
   OBSTETR[facility] POSTPARTUM CLINIC PROGRESS NOTE  Subjective:     Teresa Ortiz is a 26 y.o. 172P2002 female who presents for a postpartum visit. She is 6 weeks postpartum following a spontaneous vaginal delivery. I have fully reviewed the prenatal and intrapartum course. The delivery was at 38 gestational weeks.  Anesthesia: epidural. Postpartum course has been well. Baby's course has been well. Baby is feeding by bottle - Similac Advance. Bleeding: patient has not resumed menses, with No LMP recorded.. Bowel function is normal. Bladder function is normal. Patient is not sexually active. Contraception method desired is Ship brokerrtho-Evra patches weekly. Postpartum depression screening: negative. PHQ-9 Score 2  The following portions of the patient's history were reviewed and updated as appropriate: allergies, current medications, past family history, past medical history, past social history, past surgical history and problem list.  Review of Systems Pertinent items noted in HPI and remainder of comprehensive ROS otherwise negative.   Objective:    BP (!) 98/56 (BP Location: Left Arm, Patient Position: Sitting, Cuff Size: Normal)   Pulse 80   Wt 183 lb 11.2 oz (83.3 kg)   Breastfeeding? No   BMI 26.36 kg/m   General:  alert and no distress   Breasts:  inspection negative, no nipple discharge or bleeding, no masses or nodularity palpable  Lungs: clear to auscultation bilaterally  Heart:  regular rate and rhythm, S1, S2 normal, no murmur, click, rub or gallop  Abdomen: soft, non-tender; bowel sounds normal; no masses,  no organomegaly.     Vulva:  normal  Vagina: normal vagina, no discharge, exudate, lesion, or erythema  Cervix:  no cervical motion tenderness and no lesions  Corpus: normal size, contour, position, consistency, mobility, non-tender  Adnexa:  normal adnexa and no mass, fullness, tenderness  Rectal Exam: Not performed.         Labs:  Lab Results  Component Value Date   HGB 9.1  (L) 06/02/2016    Assessment:    Routine postpartum exam.   Postpartum anemia  Plan:    1. Contraception: Ortho-Evra patches weekly. Can begin on Sunday start.  2. Will check Hgb for h/o anemia.  3. Can resume all normal activities. 4. Follow up in: 4 months for annual exam, or sooner as needed.    Hildred LaserAnika Lillymae Duet, MD [facility]

## 2016-07-10 ENCOUNTER — Other Ambulatory Visit: Payer: Medicaid Other

## 2016-07-10 ENCOUNTER — Ambulatory Visit (INDEPENDENT_AMBULATORY_CARE_PROVIDER_SITE_OTHER): Payer: Medicaid Other | Admitting: Obstetrics and Gynecology

## 2016-07-10 DIAGNOSIS — O9081 Anemia of the puerperium: Secondary | ICD-10-CM

## 2016-07-10 DIAGNOSIS — Z30016 Encounter for initial prescription of transdermal patch hormonal contraceptive device: Secondary | ICD-10-CM

## 2016-07-10 MED ORDER — NORELGESTROMIN-ETH ESTRADIOL 150-35 MCG/24HR TD PTWK: 1.0000 | MEDICATED_PATCH | TRANSDERMAL | 12 refills | Status: DC

## 2016-07-10 NOTE — Patient Instructions (Signed)

## 2017-07-01 NOTE — L&D Delivery Note (Signed)
     Delivery Note   Teresa Ortiz is a 27 y.o. [id number] at 3148w3d Estimated Date of Delivery: 05/19/18  PRE-OPERATIVE DIAGNOSIS:  1) 2948w3d pregnancy.   POST-OPERATIVE DIAGNOSIS:  1) 2448w3d pregnancy s/p Vaginal, Spontaneous   Delivery Type: Vaginal, Spontaneous    Delivery Anesthesia: Epidural   Labor Complications:   none    ESTIMATED BLOOD LOSS: 100  ml    FINDINGS:   1) female infant, Apgar scores of 8    at 1 minute and 9    at 5 minutes and a birthweight of 109.35  ounces.    2) Nuchal cord: yes, easily reduced  SPECIMENS:   PLACENTA:   Appearance: Intact , 3 vessel cord, sample of cord blood collected   Removal: Spontaneous      Disposition:   Held per protocol then discarded  DISPOSITION:  Infant to left in stable condition in the delivery room, with L&D personnel and mother,  NARRATIVE SUMMARY: Labor course:  [name] is a [id number] at 8848w3d who presented for labor management.  She progressed well in labor with pitocin.  She received the appropriate epidural anesthesia and proceeded to complete dilation. She evidenced good maternal expulsive effort during the second stage. She went on to deliver a viable female infant in OA, [facility]. The placenta delivered without problems and was noted to be complete. A perineal and vaginal examination was performed.  Episiotomy/Lacerations:   none. Vaginal vault counts correct x 2. The patient tolerated this well.  Doreene Burkennie Zeth Buday, CNM  05/15/2018 5:03 AM

## 2017-07-03 ENCOUNTER — Encounter: Payer: Self-pay | Admitting: Obstetrics and Gynecology

## 2017-07-03 ENCOUNTER — Encounter: Payer: Self-pay | Admitting: Certified Nurse Midwife

## 2017-07-04 ENCOUNTER — Other Ambulatory Visit: Payer: Self-pay | Admitting: Obstetrics and Gynecology

## 2017-07-04 DIAGNOSIS — N926 Irregular menstruation, unspecified: Secondary | ICD-10-CM

## 2017-07-07 ENCOUNTER — Ambulatory Visit (INDEPENDENT_AMBULATORY_CARE_PROVIDER_SITE_OTHER): Payer: Medicaid Other | Admitting: Obstetrics and Gynecology

## 2017-07-07 ENCOUNTER — Other Ambulatory Visit: Payer: Self-pay

## 2017-07-07 ENCOUNTER — Encounter: Payer: Self-pay | Admitting: Obstetrics and Gynecology

## 2017-07-07 DIAGNOSIS — O200 Threatened abortion: Secondary | ICD-10-CM

## 2017-07-07 DIAGNOSIS — N926 Irregular menstruation, unspecified: Secondary | ICD-10-CM | POA: Diagnosis not present

## 2017-07-07 DIAGNOSIS — R319 Hematuria, unspecified: Secondary | ICD-10-CM

## 2017-07-07 LAB — POCT URINALYSIS DIPSTICK
BILIRUBIN UA: NEGATIVE
GLUCOSE UA: NEGATIVE
Ketones, UA: NEGATIVE
Leukocytes, UA: NEGATIVE
Nitrite, UA: NEGATIVE
PH UA: 5 (ref 5.0–8.0)
Protein, UA: NEGATIVE
Spec Grav, UA: 1.02 (ref 1.010–1.025)
UROBILINOGEN UA: 0.2 U/dL

## 2017-07-07 NOTE — Progress Notes (Signed)
HPI:      [name] is a 27 y.o. [phone] who LMP was No LMP recorded.  Subjective:   She presents today complaining of a positive pregnancy test and spotting over the last 3 days.  She says it has been very light mostly when she wipes.  She has not had a pregnancy confirmation for ultrasound and is not entirely sure of her last menstrual period.  She reports no passage of tissue. She denies any other problems.    Hx: The following portions of the patient's history were reviewed and updated as appropriate:             She  has a past medical history of Asthma, Frequent headaches, and Migraine without aura. She does not have any pertinent problems on file. She  has no past surgical history on file. Her family history includes Alcohol abuse in her father; Mental illness in her mother. She  reports that  has never smoked. she has never used smokeless tobacco. She reports that she does not drink alcohol or use drugs. She is allergic to sulfa antibiotics.       Review of Systems:  Review of Systems  Constitutional: Denied constitutional symptoms, night sweats, recent illness, fatigue, fever, insomnia and weight loss.  Eyes: Denied eye symptoms, eye pain, photophobia, vision change and visual disturbance.  Ears/Nose/Throat/Neck: Denied ear, nose, throat or neck symptoms, hearing loss, nasal discharge, sinus congestion and sore throat.  Cardiovascular: Denied cardiovascular symptoms, arrhythmia, chest pain/pressure, edema, exercise intolerance, orthopnea and palpitations.  Respiratory: Denied pulmonary symptoms, asthma, pleuritic pain, productive sputum, cough, dyspnea and wheezing.  Gastrointestinal: Denied, gastro-esophageal reflux, melena, nausea and vomiting.  Genitourinary: See HPI for additional information.  Musculoskeletal: Denied musculoskeletal symptoms, stiffness, swelling, muscle weakness and myalgia.  Dermatologic: Denied dermatology symptoms, rash and scar.  Neurologic:  Denied neurology symptoms, dizziness, headache, neck pain and syncope.  Psychiatric: Denied psychiatric symptoms, anxiety and depression.  Endocrine: Denied endocrine symptoms including hot flashes and night sweats.   Meds:   Current Outpatient Medications on File Prior to Visit  Medication Sig Dispense Refill  . Prenatal Vit-Fe Fumarate-FA (PRENATAL MULTIVITAMIN) TABS tablet Take 1 tablet by mouth daily at 12 noon.     No current facility-administered medications on file prior to visit.     Objective:     There were no vitals filed for this visit.              Assessment:    [id number] Patient Active Problem List   Diagnosis Date Noted  . Advance care planning 06/13/2014  . Migraine without aura 05/11/2014  . Back pain 05/11/2014     1. Missed menses   2. Threatened miscarriage in early pregnancy     Patient had an "emergency visit" today because of spotting in early pregnancy and was seen as a walk-in.  Other than a small amount of vaginal bleeding no other evidence of miscarriage.   Plan:            1.  Ultrasound already scheduled for tomorrow  2.  Visit already scheduled for tomorrow  Have advised patient to keep her ultrasound and visit appointments. SAb I have discussed the possibility of miscarriage with the patient.  I have informed her that vaginal bleeding, cramping or passage of tissue are the most common signs of miscarriage.  Should she have heavy bleeding, pass tissue or have other problems, she has been informed to call the office immediately.  I  have advised her to remain at pelvic rest for at least one week after her last episode of bleeding.  If she is currently working, I have instructed her to discontinue until this situation resolves.  Complete versus incomplete miscarriage was discussed and the patient is aware that should she develop heavy bleeding, fever, or persistent crampy pelvic pain she may require a D & E to remove the remaining portion of the  miscarried pregnancy.  I advised her to keep me informed should her condition change. Blood type O+. Urine sent for C&S Orders Orders Placed This Encounter  Procedures  . POCT urinalysis dipstick    No orders of the defined types were placed in this encounter.     F/U  No Follow-up on file. I spent 16 minutes with this patient of which greater than 50% was spent discussing spotting in early pregnancy, miscarriage, expected follow-up.  Elonda Huskyavid J. Daylan Juhnke, M.D. 07/07/2017 12:00 PM

## 2017-07-07 NOTE — Addendum Note (Signed)
Addended by: Brooke DareSICK, Vauda Salvucci L on: 07/07/2017 01:22 PM   Modules accepted: Orders

## 2017-07-08 ENCOUNTER — Ambulatory Visit (INDEPENDENT_AMBULATORY_CARE_PROVIDER_SITE_OTHER): Payer: Medicaid Other

## 2017-07-08 ENCOUNTER — Ambulatory Visit (INDEPENDENT_AMBULATORY_CARE_PROVIDER_SITE_OTHER): Payer: Medicaid Other | Admitting: Obstetrics and Gynecology

## 2017-07-08 ENCOUNTER — Encounter: Payer: Self-pay | Admitting: Obstetrics and Gynecology

## 2017-07-08 VITALS — BP 117/69 | HR 86 | Wt 186.4 lb

## 2017-07-08 DIAGNOSIS — O200 Threatened abortion: Secondary | ICD-10-CM

## 2017-07-08 DIAGNOSIS — N926 Irregular menstruation, unspecified: Secondary | ICD-10-CM

## 2017-07-08 NOTE — Progress Notes (Signed)
HPI:      [name] is a 27 y.o. [id number] who LMP was No LMP recorded. Patient is pregnant.  Subjective:   She presents today following her ultrasound.  She reports that she has not had any bleeding today.  She denies cramping or passage of tissue.  She has no complaints.    Hx: The following portions of the patient's history were reviewed and updated as appropriate:             She  has a past medical history of Asthma, Frequent headaches, and Migraine without aura. She does not have any pertinent problems on file. She  has no past surgical history on file. Her family history includes Alcohol abuse in her father; Mental illness in her mother. She  reports that  has never smoked. she has never used smokeless tobacco. She reports that she does not drink alcohol or use drugs. She is allergic to sulfa antibiotics.       Review of Systems:  Review of Systems  Constitutional: Denied constitutional symptoms, night sweats, recent illness, fatigue, fever, insomnia and weight loss.  Eyes: Denied eye symptoms, eye pain, photophobia, vision change and visual disturbance.  Ears/Nose/Throat/Neck: Denied ear, nose, throat or neck symptoms, hearing loss, nasal discharge, sinus congestion and sore throat.  Cardiovascular: Denied cardiovascular symptoms, arrhythmia, chest pain/pressure, edema, exercise intolerance, orthopnea and palpitations.  Respiratory: Denied pulmonary symptoms, asthma, pleuritic pain, productive sputum, cough, dyspnea and wheezing.  Gastrointestinal: Denied, gastro-esophageal reflux, melena, nausea and vomiting.  Genitourinary: Denied genitourinary symptoms including symptomatic vaginal discharge, pelvic relaxation issues, and urinary complaints.  Musculoskeletal: Denied musculoskeletal symptoms, stiffness, swelling, muscle weakness and myalgia.  Dermatologic: Denied dermatology symptoms, rash and scar.  Neurologic: Denied neurology symptoms, dizziness, headache, neck pain  and syncope.  Psychiatric: Denied psychiatric symptoms, anxiety and depression.  Endocrine: Denied endocrine symptoms including hot flashes and night sweats.   Meds:   Current Outpatient Medications on File Prior to Visit  Medication Sig Dispense Refill  . Prenatal Vit-Fe Fumarate-FA (PRENATAL MULTIVITAMIN) TABS tablet Take 1 tablet by mouth daily at 12 noon.     No current facility-administered medications on file prior to visit.     Objective:     Vitals:   07/08/17 1106  BP: 117/69  Pulse: 86              Ultrasound results reviewed directly with the patient.  Assessment:    [id number] Patient Active Problem List   Diagnosis Date Noted  . Advance care planning 06/13/2014  . Migraine without aura 05/11/2014  . Back pain 05/11/2014     1. Threatened miscarriage in early pregnancy     The patient with currently viable intrauterine pregnancy although fetal heart rate is low.  Proximally 6 weeks estimated gestational age.   Plan:            1.  Expectant management of pregnancy  2.  I have reiterated the possibility of miscarriage and have discussed this again in detail.  The possibility of a normal intrauterine pregnancy has also been discussed.  3.  Repeat ultrasound one week. Orders No orders of the defined types were placed in this encounter.   No orders of the defined types were placed in this encounter.     F/U  Return in about 1 week (around 07/15/2017). I spent 15 minutes with this patient of which greater than 50% was spent discussing her ultrasound findings, possibility of normal intrauterine pregnancy versus miscarriage.  All questions were answered.  Future workup discussed.  Elonda Husky, M.D. 07/08/2017 11:25 AM

## 2017-07-09 ENCOUNTER — Other Ambulatory Visit: Payer: Self-pay | Admitting: Obstetrics and Gynecology

## 2017-07-09 DIAGNOSIS — N926 Irregular menstruation, unspecified: Secondary | ICD-10-CM

## 2017-07-09 LAB — URINE CULTURE: ORGANISM ID, BACTERIA: NO GROWTH

## 2017-07-15 ENCOUNTER — Ambulatory Visit (INDEPENDENT_AMBULATORY_CARE_PROVIDER_SITE_OTHER): Payer: Medicaid Other

## 2017-07-15 ENCOUNTER — Ambulatory Visit (INDEPENDENT_AMBULATORY_CARE_PROVIDER_SITE_OTHER): Payer: Medicaid Other | Admitting: Obstetrics and Gynecology

## 2017-07-15 ENCOUNTER — Encounter: Payer: Self-pay | Admitting: Obstetrics and Gynecology

## 2017-07-15 VITALS — BP 126/87 | HR 101 | Wt 186.3 lb

## 2017-07-15 DIAGNOSIS — O021 Missed abortion: Secondary | ICD-10-CM | POA: Diagnosis not present

## 2017-07-15 DIAGNOSIS — N926 Irregular menstruation, unspecified: Secondary | ICD-10-CM

## 2017-07-15 NOTE — H&P (View-Only) (Signed)
PRE-OPERATIVE HISTORY AND PHYSICAL EXAM  PCP:  Patient, No Pcp Per Subjective:   HPI:  Teresa Ortiz is a 27 y.o. [id number].  No LMP recorded. Patient is pregnant.  She presents today for a pre-op discussion and PE.  She has the following symptoms: Vaginal bleeding and cramping and ultrasound revealing failed intrauterine pregnancy.  Fetal pole without heartbeat.  Review of Systems:   Constitutional: Denied constitutional symptoms, night sweats, recent illness, fatigue, fever, insomnia and weight loss.  Eyes: Denied eye symptoms, eye pain, photophobia, vision change and visual disturbance.  Ears/Nose/Throat/Neck: Denied ear, nose, throat or neck symptoms, hearing loss, nasal discharge, sinus congestion and sore throat.  Cardiovascular: Denied cardiovascular symptoms, arrhythmia, chest pain/pressure, edema, exercise intolerance, orthopnea and palpitations.  Respiratory: Denied pulmonary symptoms, asthma, pleuritic pain, productive sputum, cough, dyspnea and wheezing.  Gastrointestinal: Denied, gastro-esophageal reflux, melena, nausea and vomiting.  Genitourinary: See HPI for additional information.  Musculoskeletal: Denied musculoskeletal symptoms, stiffness, swelling, muscle weakness and myalgia.  Dermatologic: Denied dermatology symptoms, rash and scar.  Neurologic: Denied neurology symptoms, dizziness, headache, neck pain and syncope.  Psychiatric: Denied psychiatric symptoms, anxiety and depression.  Endocrine: Denied endocrine symptoms including hot flashes and night sweats.   OB History  Gravida Para Term Preterm AB Living  3 2 2     2   SAB TAB Ectopic Multiple Live Births        0 2    # Outcome Date GA Lbr Len/2nd Weight Sex Delivery Anes PTL Lv  3 Current           2 Term 06/01/16 5360w0d / 00:14 6 lb 1 oz (2.75 kg) M Vag-Spont EPI  LIV  1 Term 2011   6 lb 6.4 oz (2.903 kg) F Vag-Spont   LIV      Past Medical History:  Diagnosis Date  . Asthma    childhood  .  Frequent headaches   . Migraine without aura     History reviewed. No pertinent surgical history.    SOCIAL HISTORY: Social History   Tobacco Use  Smoking Status Never Smoker  Smokeless Tobacco Never Used   Social History   Substance and Sexual Activity  Alcohol Use No  . Alcohol/week: 0.0 oz   Comment: rarely   Social History   Substance and Sexual Activity  Drug Use No    Family History  Problem Relation Age of Onset  . Mental illness Mother        bipolor/depressed  . Alcohol abuse Father     ALLERGIES:  Sulfa antibiotics  MEDS:   No current outpatient medications on file prior to visit.   No current facility-administered medications on file prior to visit.     No orders of the defined types were placed in this encounter.    Physical examination BP 126/87   Pulse (!) 101   Wt 186 lb 5 oz (84.5 kg)   BMI 26.73 kg/m   General NAD, Conversant  HEENT Atraumatic; Op clear with mmm.  Normo-cephalic. Pupils reactive. Anicteric sclerae  Thyroid/Neck Smooth without nodularity or enlargement. Normal ROM.  Neck Supple.  Skin No rashes, lesions or ulceration. Normal palpated skin turgor. No nodularity.  Breasts: No masses or discharge.  Symmetric.  No axillary adenopathy.  Lungs: Clear to auscultation.No rales or wheezes. Normal Respiratory effort, no retractions.  Heart: NSR.  No murmurs or rubs appreciated. No periferal edema  Abdomen: Soft.  Non-tender.  No masses.  No HSM.  No hernia  Extremities: Moves all appropriately.  Normal ROM for age. No lymphadenopathy.  Neuro: Oriented to PPT.  Normal mood. Normal affect.     Pelvic:   Pelvic exam deferred to operating room.   Ultrasound results reviewed directly with the patient.  Assessment:   [id number] Patient Active Problem List   Diagnosis Date Noted  . Advance care planning 06/13/2014  . Migraine without aura 05/11/2014  . Back pain 05/11/2014    1. Missed ab   2. Missed menses    Multiple  management options discussed with the patient and she has elected D&E   Plan:   Orders: No orders of the defined types were placed in this encounter.    1.  D&E  Teresa Ortiz, M.D. 07/15/2017 11:23 AM

## 2017-07-15 NOTE — H&P (Signed)
     PRE-OPERATIVE HISTORY AND PHYSICAL EXAM  PCP:  Patient, No Pcp Per Subjective:   HPI:  Teresa Ortiz is a 26 y.o. G3P2002.  No LMP recorded. Patient is pregnant.  She presents today for a pre-op discussion and PE.  She has the following symptoms: Vaginal bleeding and cramping and ultrasound revealing failed intrauterine pregnancy.  Fetal pole without heartbeat.  Review of Systems:   Constitutional: Denied constitutional symptoms, night sweats, recent illness, fatigue, fever, insomnia and weight loss.  Eyes: Denied eye symptoms, eye pain, photophobia, vision change and visual disturbance.  Ears/Nose/Throat/Neck: Denied ear, nose, throat or neck symptoms, hearing loss, nasal discharge, sinus congestion and sore throat.  Cardiovascular: Denied cardiovascular symptoms, arrhythmia, chest pain/pressure, edema, exercise intolerance, orthopnea and palpitations.  Respiratory: Denied pulmonary symptoms, asthma, pleuritic pain, productive sputum, cough, dyspnea and wheezing.  Gastrointestinal: Denied, gastro-esophageal reflux, melena, nausea and vomiting.  Genitourinary: See HPI for additional information.  Musculoskeletal: Denied musculoskeletal symptoms, stiffness, swelling, muscle weakness and myalgia.  Dermatologic: Denied dermatology symptoms, rash and scar.  Neurologic: Denied neurology symptoms, dizziness, headache, neck pain and syncope.  Psychiatric: Denied psychiatric symptoms, anxiety and depression.  Endocrine: Denied endocrine symptoms including hot flashes and night sweats.   OB History  Gravida Para Term Preterm AB Living  3 2 2     2  SAB TAB Ectopic Multiple Live Births        0 2    # Outcome Date GA Lbr Len/2nd Weight Sex Delivery Anes PTL Lv  3 Current           2 Term 06/01/16 [redacted]w[redacted]d / 00:14 6 lb 1 oz (2.75 kg) M Vag-Spont EPI  LIV  1 Term 2011   6 lb 6.4 oz (2.903 kg) F Vag-Spont   LIV      Past Medical History:  Diagnosis Date  . Asthma    childhood  .  Frequent headaches   . Migraine without aura     History reviewed. No pertinent surgical history.    SOCIAL HISTORY: Social History   Tobacco Use  Smoking Status Never Smoker  Smokeless Tobacco Never Used   Social History   Substance and Sexual Activity  Alcohol Use No  . Alcohol/week: 0.0 oz   Comment: rarely   Social History   Substance and Sexual Activity  Drug Use No    Family History  Problem Relation Age of Onset  . Mental illness Mother        bipolor/depressed  . Alcohol abuse Father     ALLERGIES:  Sulfa antibiotics  MEDS:   No current outpatient medications on file prior to visit.   No current facility-administered medications on file prior to visit.     No orders of the defined types were placed in this encounter.    Physical examination BP 126/87   Pulse (!) 101   Wt 186 lb 5 oz (84.5 kg)   BMI 26.73 kg/m   General NAD, Conversant  HEENT Atraumatic; Op clear with mmm.  Normo-cephalic. Pupils reactive. Anicteric sclerae  Thyroid/Neck Smooth without nodularity or enlargement. Normal ROM.  Neck Supple.  Skin No rashes, lesions or ulceration. Normal palpated skin turgor. No nodularity.  Breasts: No masses or discharge.  Symmetric.  No axillary adenopathy.  Lungs: Clear to auscultation.No rales or wheezes. Normal Respiratory effort, no retractions.  Heart: NSR.  No murmurs or rubs appreciated. No periferal edema  Abdomen: Soft.  Non-tender.  No masses.  No HSM.   No hernia  Extremities: Moves all appropriately.  Normal ROM for age. No lymphadenopathy.  Neuro: Oriented to PPT.  Normal mood. Normal affect.     Pelvic:   Pelvic exam deferred to operating room.   Ultrasound results reviewed directly with the patient.  Assessment:   G3P2002 Patient Active Problem List   Diagnosis Date Noted  . Advance care planning 06/13/2014  . Migraine without aura 05/11/2014  . Back pain 05/11/2014    1. Missed ab   2. Missed menses    Multiple  management options discussed with the patient and she has elected D&E   Plan:   Orders: No orders of the defined types were placed in this encounter.    1.  D&E  Miyoko Hashimi J. Joli Koob, M.D. 07/15/2017 11:23 AM   

## 2017-07-15 NOTE — Progress Notes (Signed)
PRE-OPERATIVE HISTORY AND PHYSICAL EXAM  PCP:  Patient, No Pcp Per Subjective:   HPI:  Teresa Ortiz is a 27 y.o. [id number].  No LMP recorded. Patient is pregnant.  She presents today for a pre-op discussion and PE.  She has the following symptoms: Vaginal bleeding and cramping and ultrasound revealing failed intrauterine pregnancy.  Fetal pole without heartbeat.  Review of Systems:   Constitutional: Denied constitutional symptoms, night sweats, recent illness, fatigue, fever, insomnia and weight loss.  Eyes: Denied eye symptoms, eye pain, photophobia, vision change and visual disturbance.  Ears/Nose/Throat/Neck: Denied ear, nose, throat or neck symptoms, hearing loss, nasal discharge, sinus congestion and sore throat.  Cardiovascular: Denied cardiovascular symptoms, arrhythmia, chest pain/pressure, edema, exercise intolerance, orthopnea and palpitations.  Respiratory: Denied pulmonary symptoms, asthma, pleuritic pain, productive sputum, cough, dyspnea and wheezing.  Gastrointestinal: Denied, gastro-esophageal reflux, melena, nausea and vomiting.  Genitourinary: See HPI for additional information.  Musculoskeletal: Denied musculoskeletal symptoms, stiffness, swelling, muscle weakness and myalgia.  Dermatologic: Denied dermatology symptoms, rash and scar.  Neurologic: Denied neurology symptoms, dizziness, headache, neck pain and syncope.  Psychiatric: Denied psychiatric symptoms, anxiety and depression.  Endocrine: Denied endocrine symptoms including hot flashes and night sweats.   OB History  Gravida Para Term Preterm AB Living  3 2 2     2   SAB TAB Ectopic Multiple Live Births        0 2    # Outcome Date GA Lbr Len/2nd Weight Sex Delivery Anes PTL Lv  3 Current           2 Term 06/01/16 5360w0d / 00:14 6 lb 1 oz (2.75 kg) M Vag-Spont EPI  LIV  1 Term 2011   6 lb 6.4 oz (2.903 kg) F Vag-Spont   LIV      Past Medical History:  Diagnosis Date  . Asthma    childhood  .  Frequent headaches   . Migraine without aura     History reviewed. No pertinent surgical history.    SOCIAL HISTORY: Social History   Tobacco Use  Smoking Status Never Smoker  Smokeless Tobacco Never Used   Social History   Substance and Sexual Activity  Alcohol Use No  . Alcohol/week: 0.0 oz   Comment: rarely   Social History   Substance and Sexual Activity  Drug Use No    Family History  Problem Relation Age of Onset  . Mental illness Mother        bipolor/depressed  . Alcohol abuse Father     ALLERGIES:  Sulfa antibiotics  MEDS:   No current outpatient medications on file prior to visit.   No current facility-administered medications on file prior to visit.     No orders of the defined types were placed in this encounter.    Physical examination BP 126/87   Pulse (!) 101   Wt 186 lb 5 oz (84.5 kg)   BMI 26.73 kg/m   General NAD, Conversant  HEENT Atraumatic; Op clear with mmm.  Normo-cephalic. Pupils reactive. Anicteric sclerae  Thyroid/Neck Smooth without nodularity or enlargement. Normal ROM.  Neck Supple.  Skin No rashes, lesions or ulceration. Normal palpated skin turgor. No nodularity.  Breasts: No masses or discharge.  Symmetric.  No axillary adenopathy.  Lungs: Clear to auscultation.No rales or wheezes. Normal Respiratory effort, no retractions.  Heart: NSR.  No murmurs or rubs appreciated. No periferal edema  Abdomen: Soft.  Non-tender.  No masses.  No HSM.  No hernia  Extremities: Moves all appropriately.  Normal ROM for age. No lymphadenopathy.  Neuro: Oriented to PPT.  Normal mood. Normal affect.     Pelvic:   Pelvic exam deferred to operating room.   Ultrasound results reviewed directly with the patient.  Assessment:   [id number] Patient Active Problem List   Diagnosis Date Noted  . Advance care planning 06/13/2014  . Migraine without aura 05/11/2014  . Back pain 05/11/2014    1. Missed ab   2. Missed menses    Multiple  management options discussed with the patient and she has elected D&E   Plan:   Orders: No orders of the defined types were placed in this encounter.    1.  D&E  Pre-op discussions regarding Risks and Benefits of her scheduled surgery.  D&E The procedure and the risks and benefits of dilation and curettage/evacuation have been explained to the patient.  The specific risks of bleeding, infection, anesthesia, uterine perforation, and damage to bowel or bladder  have been specifically discussed.  I have answered all of her questions and I believe that she has an adequate and informed understanding of this procedure. SAb I have discussed the possibility of miscarriage with the patient.  I have informed her that vaginal bleeding, cramping or passage of tissue are the most common signs of miscarriage.  Should she have heavy bleeding, pass tissue or have other problems, she has been informed to call the office immediately.  I have advised her to remain at pelvic rest for at least one week after her last episode of bleeding.  If she is currently working, I have instructed her to discontinue until this situation resolves.  Complete versus incomplete miscarriage was discussed and the patient is aware that should she develop heavy bleeding, fever, or persistent crampy pelvic pain she may require a D & E to remove the remaining portion of the miscarried pregnancy.  I advised her to keep me informed should her condition change.    Elonda Huskyavid J. Walter Grima, M.D. 07/15/2017 11:23 AM

## 2017-07-16 ENCOUNTER — Encounter
Admission: RE | Disposition: A | Discharge status: Home / Self Care | Payer: Self-pay | Source: Ambulatory Visit | Attending: Obstetrics and Gynecology

## 2017-07-16 ENCOUNTER — Ambulatory Visit: Payer: Medicaid Other | Admitting: Anesthesiology

## 2017-07-16 ENCOUNTER — Ambulatory Visit
Admission: RE | Admit: 2017-07-16 | Discharge: 2017-07-16 | Disposition: A | Discharge status: Home / Self Care | Payer: Medicaid Other | Source: Ambulatory Visit | Attending: Obstetrics and Gynecology | Admitting: Obstetrics and Gynecology

## 2017-07-16 ENCOUNTER — Other Ambulatory Visit: Payer: Self-pay

## 2017-07-16 DIAGNOSIS — O021 Missed abortion: Secondary | ICD-10-CM | POA: Diagnosis present

## 2017-07-16 DIAGNOSIS — J45909 Unspecified asthma, uncomplicated: Secondary | ICD-10-CM | POA: Diagnosis not present

## 2017-07-16 HISTORY — PX: DILATION AND EVACUATION: SHX1459

## 2017-07-16 LAB — TYPE AND SCREEN
ABO/RH(D): O POS
ANTIBODY SCREEN: NEGATIVE
Extend sample reason: UNDETERMINED

## 2017-07-16 LAB — CBC
HCT: 38.1 % (ref 35.0–47.0)
HEMOGLOBIN: 12.3 g/dL (ref 12.0–16.0)
MCH: 25.1 pg — ABNORMAL LOW (ref 26.0–34.0)
MCHC: 32.3 g/dL (ref 32.0–36.0)
MCV: 77.6 fL — AB (ref 80.0–100.0)
Platelets: 291 10*3/uL (ref 150–440)
RBC: 4.91 MIL/uL (ref 3.80–5.20)
RDW: 16.2 % — AB (ref 11.5–14.5)
WBC: 9.1 10*3/uL (ref 3.6–11.0)

## 2017-07-16 SURGERY — DILATION AND EVACUATION, UTERUS
Anesthesia: General

## 2017-07-16 MED ORDER — LIDOCAINE HCL (PF) 2 % IJ SOLN
INTRAMUSCULAR | Status: AC
  Filled 2017-07-16: qty 10

## 2017-07-16 MED ORDER — DEXAMETHASONE SODIUM PHOSPHATE 10 MG/ML IJ SOLN
INTRAMUSCULAR | Status: DC | PRN
  Administered 2017-07-16: 10 mg via INTRAVENOUS

## 2017-07-16 MED ORDER — OXYCODONE HCL 5 MG/5ML PO SOLN: 5.0000 mg | Freq: Once | ORAL | Status: DC | PRN

## 2017-07-16 MED ORDER — OXYCODONE HCL 5 MG PO TABS: 5.0000 mg | ORAL_TABLET | Freq: Once | ORAL | Status: DC | PRN

## 2017-07-16 MED ORDER — KETOROLAC TROMETHAMINE 30 MG/ML IJ SOLN
INTRAMUSCULAR | Status: AC
  Filled 2017-07-16: qty 1

## 2017-07-16 MED ORDER — SEVOFLURANE IN SOLN
RESPIRATORY_TRACT | Status: AC
  Filled 2017-07-16: qty 250

## 2017-07-16 MED ORDER — EPHEDRINE SULFATE 50 MG/ML IJ SOLN
INTRAMUSCULAR | Status: AC
  Filled 2017-07-16: qty 1

## 2017-07-16 MED ORDER — LIDOCAINE HCL (CARDIAC) 20 MG/ML IV SOLN
INTRAVENOUS | Status: DC | PRN
  Administered 2017-07-16: 50 mg via INTRAVENOUS

## 2017-07-16 MED ORDER — FAMOTIDINE 20 MG PO TABS
ORAL_TABLET | ORAL | Status: AC
  Administered 2017-07-16: 20 mg via ORAL
  Filled 2017-07-16: qty 1

## 2017-07-16 MED ORDER — OXYTOCIN 10 UNIT/ML IJ SOLN
INTRAVENOUS | Status: DC | PRN
  Administered 2017-07-16: 20 [IU] via INTRAVENOUS

## 2017-07-16 MED ORDER — OXYTOCIN 10 UNIT/ML IJ SOLN
INTRAMUSCULAR | Status: AC
  Filled 2017-07-16: qty 2

## 2017-07-16 MED ORDER — PROMETHAZINE HCL 25 MG/ML IJ SOLN: 6.2500 mg | INTRAMUSCULAR | Status: DC | PRN

## 2017-07-16 MED ORDER — FENTANYL CITRATE (PF) 100 MCG/2ML IJ SOLN: 25.0000 ug | INTRAMUSCULAR | Status: DC | PRN

## 2017-07-16 MED ORDER — ONDANSETRON HCL 4 MG/2ML IJ SOLN
INTRAMUSCULAR | Status: DC | PRN
  Administered 2017-07-16: 4 mg via INTRAVENOUS

## 2017-07-16 MED ORDER — PROPOFOL 10 MG/ML IV BOLUS
INTRAVENOUS | Status: DC | PRN
  Administered 2017-07-16: 200 mg via INTRAVENOUS

## 2017-07-16 MED ORDER — PROPOFOL 10 MG/ML IV BOLUS
INTRAVENOUS | Status: AC
  Filled 2017-07-16: qty 20

## 2017-07-16 MED ORDER — FENTANYL CITRATE (PF) 100 MCG/2ML IJ SOLN
INTRAMUSCULAR | Status: AC
  Filled 2017-07-16: qty 2

## 2017-07-16 MED ORDER — FAMOTIDINE 20 MG PO TABS
20.0000 mg | ORAL_TABLET | Freq: Once | ORAL | Status: AC
  Administered 2017-07-16: 20 mg via ORAL

## 2017-07-16 MED ORDER — MEPERIDINE HCL 50 MG/ML IJ SOLN: 6.2500 mg | INTRAMUSCULAR | Status: DC | PRN

## 2017-07-16 MED ORDER — EPHEDRINE SULFATE 50 MG/ML IJ SOLN
INTRAMUSCULAR | Status: DC | PRN
  Administered 2017-07-16 (×2): 10 mg via INTRAVENOUS

## 2017-07-16 MED ORDER — LACTATED RINGERS IV SOLN: INTRAVENOUS | Status: DC

## 2017-07-16 MED ORDER — MIDAZOLAM HCL 2 MG/2ML IJ SOLN
INTRAMUSCULAR | Status: AC
  Filled 2017-07-16: qty 2

## 2017-07-16 MED ORDER — FENTANYL CITRATE (PF) 100 MCG/2ML IJ SOLN
INTRAMUSCULAR | Status: DC | PRN
  Administered 2017-07-16: 25 ug via INTRAVENOUS
  Administered 2017-07-16: 50 ug via INTRAVENOUS
  Administered 2017-07-16: 25 ug via INTRAVENOUS

## 2017-07-16 MED ORDER — LACTATED RINGERS IV SOLN
INTRAVENOUS | Status: DC
  Administered 2017-07-16: 13:00:00 via INTRAVENOUS

## 2017-07-16 MED ORDER — MIDAZOLAM HCL 2 MG/2ML IJ SOLN
INTRAMUSCULAR | Status: DC | PRN
  Administered 2017-07-16: 2 mg via INTRAVENOUS

## 2017-07-16 MED ORDER — KETOROLAC TROMETHAMINE 30 MG/ML IJ SOLN
INTRAMUSCULAR | Status: DC | PRN
  Administered 2017-07-16: 30 mg via INTRAVENOUS

## 2017-07-16 MED ORDER — ONDANSETRON HCL 4 MG/2ML IJ SOLN
INTRAMUSCULAR | Status: AC
  Filled 2017-07-16: qty 2

## 2017-07-16 MED ORDER — DEXAMETHASONE SODIUM PHOSPHATE 10 MG/ML IJ SOLN
INTRAMUSCULAR | Status: AC
  Filled 2017-07-16: qty 1

## 2017-07-16 SURGICAL SUPPLY — 23 items
ADAPTER VACURETTE TBG SET 14 (CANNULA) IMPLANT
CATH ROBINSON RED A/P 16FR (CATHETERS) ×3 IMPLANT
DRSG TELFA 3X8 NADH (GAUZE/BANDAGES/DRESSINGS) ×3 IMPLANT
FILTER UTR ASPR SPEC (MISCELLANEOUS) ×1 IMPLANT
FLTR UTR ASPR SPEC (MISCELLANEOUS) ×3
GLOVE ORTHO TXT STRL SZ7.5 (GLOVE) ×3 IMPLANT
GOWN STRL REUS W/ TWL LRG LVL3 (GOWN DISPOSABLE) ×2 IMPLANT
GOWN STRL REUS W/TWL LRG LVL3 (GOWN DISPOSABLE) ×4
KIT BERKELEY 1ST TRIMESTER 3/8 (MISCELLANEOUS) ×3 IMPLANT
KIT RM TURNOVER STRD PROC AR (KITS) ×3 IMPLANT
NEEDLE HYPO 25X1 1.5 SAFETY (NEEDLE) IMPLANT
PACK DNC HYST (MISCELLANEOUS) ×3 IMPLANT
PAD OB MATERNITY 4.3X12.25 (PERSONAL CARE ITEMS) ×3 IMPLANT
PAD PREP 24X41 OB/GYN DISP (PERSONAL CARE ITEMS) ×3 IMPLANT
SET BERKELEY SUCTION TUBING (SUCTIONS) ×3 IMPLANT
SOL PREP PVP 2OZ (MISCELLANEOUS) ×3
SOLUTION PREP PVP 2OZ (MISCELLANEOUS) ×1 IMPLANT
SPONGE XRAY 4X4 16PLY STRL (MISCELLANEOUS) IMPLANT
VACURETTE 10 RIGID CVD (CANNULA) ×3 IMPLANT
VACURETTE 12 RIGID CVD (CANNULA) IMPLANT
VACURETTE 7MM F TIP (CANNULA)
VACURETTE 7MM F TIP STRL (CANNULA) IMPLANT
VACURETTE 8 RIGID CVD (CANNULA) IMPLANT

## 2017-07-16 NOTE — Discharge Instructions (Addendum)
AMBULATORY SURGERY  DISCHARGE INSTRUCTIONS   1) The drugs that you were given will stay in your system until tomorrow so for the next 24 hours you should not:  A) Drive an automobile B) Make any legal decisions C) Drink any alcoholic beverage   2) You may resume regular meals tomorrow.  Today it is better to start with liquids and gradually work up to solid foods.  You may eat anything you prefer, but it is better to start with liquids, then soup and crackers, and gradually work up to solid foods.   3) Please notify your doctor immediately if you have any unusual bleeding, trouble breathing, redness and pain at the surgery site, drainage, fever, or pain not relieved by medication.    4) Additional Instructions:        Please contact your physician with any problems or Same Day Surgery at [phone], Monday through Friday 6 am to 4 pm, or [place] at [facility] Main number at [phone].    D&E

## 2017-07-16 NOTE — Op Note (Signed)
    OPERATIVE NOTE 07/16/2017 3:05 PM  PRE-OPERATIVE DIAGNOSIS:  1)   MISSED AB  POST-OPERATIVE DIAGNOSIS:  Same  OPERATION:  D&E  SURGEON(S): Surgeon(s) and Role:    Linzie Collin* Armari Fussell James, MD - Primary   ANESTHESIA: General  ESTIMATED BLOOD LOSS: 3ml  OPERATIVE FINDINGS: uterine contents  SPECIMEN:  ID Type Source Tests Collected by Time Destination  1 : POC [facility] Other SURGICAL PATHOLOGY Linzie CollinEvans, Jamerion Cabello James, MD 07/16/2017 1445     COMPLICATIONS: None  DRAINS: Foley to gravity  DISPOSITION: Stable to recovery room  DESCRIPTION OF PROCEDURE:      The patient was prepped and draped in the dorsal lithotomy position and placed under general anesthesia. Her cervix was grasped with a Jacob's tenaculum. Respecting the position and curvature of her cervix, it was dilated to accommodate a number 10 suction curette. The suction curette was placed within the endometrial cavity and a pressure greater than 65 mmHg was allowed to build. A systematic curettage was performed in all quadrants until no additional tissue was noted. The uterus became firm and globular. Pitocin was run in the IV. The tenaculum was removed from the cervix and hemostasis was noted. The weighted speculum was removed and the patient went to recovery room in stable condition.  Linzie CollinEvans, Terreon Ekholm James, MD [address] [place] [id number] [phone]  In 1 week   Elonda Huskyavid J. Dalayna Lauter, M.D. 07/16/2017 3:05 PM

## 2017-07-16 NOTE — Anesthesia Post-op Follow-up Note (Signed)
Anesthesia QCDR form completed.        

## 2017-07-16 NOTE — Interval H&P Note (Signed)
History and Physical Interval Note:  07/16/2017 2:13 PM  Teresa Ortiz  has presented today for surgery, with the diagnosis of MISSED AB  The various methods of treatment have been discussed with the patient and family. After consideration of risks, benefits and other options for treatment, the patient has consented to  Procedure(s): DILATATION AND EVACUATION (N/A) as a surgical intervention .  The patient's history has been reviewed, patient examined, no change in status, stable for surgery.  I have reviewed the patient's chart and labs.  Questions were answered to the patient's satisfaction.     [name]

## 2017-07-16 NOTE — Anesthesia Procedure Notes (Signed)
Procedure Name: LMA Insertion Date/Time: 07/16/2017 2:23 PM Performed by: Omer JackWeatherly, Ules Marsala, CRNA Pre-anesthesia Checklist: Patient identified, Patient being monitored, Timeout performed, Emergency Drugs available and Suction available Patient Re-evaluated:Patient Re-evaluated prior to induction Oxygen Delivery Method: Circle system utilized Preoxygenation: Pre-oxygenation with 100% oxygen Induction Type: IV induction Ventilation: Mask ventilation without difficulty LMA: LMA inserted LMA Size: 3.5 Tube type: Oral Number of attempts: 1 Placement Confirmation: positive ETCO2 and breath sounds checked- equal and bilateral Tube secured with: Tape Dental Injury: Teeth and Oropharynx as per pre-operative assessment

## 2017-07-16 NOTE — Anesthesia Preprocedure Evaluation (Signed)
Anesthesia Evaluation  Patient identified by MRN, date of birth, ID band Patient awake    Reviewed: Allergy & Precautions, NPO status , Patient's Chart, lab work & pertinent test results  History of Anesthesia Complications Negative for: history of anesthetic complications  Airway Mallampati: I  TM Distance: >3 FB Neck ROM: Full    Dental no notable dental hx.    Pulmonary asthma , neg sleep apnea, neg COPD,    breath sounds clear to auscultation- rhonchi (-) wheezing      Cardiovascular Exercise Tolerance: Good (-) hypertension(-) CAD and (-) Past MI  Rhythm:Regular Rate:Normal - Systolic murmurs and - Diastolic murmurs    Neuro/Psych  Headaches, negative psych ROS   GI/Hepatic negative GI ROS, Neg liver ROS,   Endo/Other  negative endocrine ROSneg diabetes  Renal/GU negative Renal ROS     Musculoskeletal negative musculoskeletal ROS (+)   Abdominal (+) - obese,   Peds  Hematology negative hematology ROS (+)   Anesthesia Other Findings   Reproductive/Obstetrics                             Anesthesia Physical Anesthesia Plan  ASA: II  Anesthesia Plan: General   Post-op Pain Management:    Induction: Intravenous  PONV Risk Score and Plan: 2 and Ondansetron and Dexamethasone  Airway Management Planned: LMA  Additional Equipment:   Intra-op Plan:   Post-operative Plan:   Informed Consent: I have reviewed the patients History and Physical, chart, labs and discussed the procedure including the risks, benefits and alternatives for the proposed anesthesia with the patient or authorized representative who has indicated his/her understanding and acceptance.   Dental advisory given  Plan Discussed with: CRNA and Anesthesiologist  Anesthesia Plan Comments:         Anesthesia Quick Evaluation

## 2017-07-16 NOTE — Anesthesia Postprocedure Evaluation (Signed)
Anesthesia Post Note  Patient: Teresa Ortiz  Procedure(s) Performed: DILATATION AND EVACUATION (N/A )  Patient location during evaluation: PACU Anesthesia Type: General Level of consciousness: awake and alert and oriented Pain management: pain level controlled Vital Signs Assessment: post-procedure vital signs reviewed and stable Respiratory status: spontaneous breathing, nonlabored ventilation and respiratory function stable Cardiovascular status: blood pressure returned to baseline and stable Postop Assessment: no signs of nausea or vomiting Anesthetic complications: no     Last Vitals:  Vitals:   07/16/17 1530 07/16/17 1545  BP: 98/75 (!) 126/58  Pulse: 87 91  Resp: (!) 9 16  Temp: 36.6 C (!) 36.3 C  SpO2: 99% 100%    Last Pain:  Vitals:   07/16/17 1545  TempSrc: Temporal  PainSc:                  Avriana Joo

## 2017-07-16 NOTE — Transfer of Care (Signed)
Immediate Anesthesia Transfer of Care Note  Patient: Teresa Ortiz  Procedure(s) Performed: DILATATION AND EVACUATION (N/A )  Patient Location: PACU  Anesthesia Type:General  Level of Consciousness: drowsy and patient cooperative  Airway & Oxygen Therapy: Patient Spontanous Breathing and Patient connected to face mask oxygen  Post-op Assessment: Report given to RN and Post -op Vital signs reviewed and stable  Post vital signs: Reviewed and stable  Last Vitals:  Vitals:   07/16/17 1500 07/16/17 1503  BP:  (!) 107/58  Pulse:  100  Resp:  20  Temp: (!) (P) 36.3 C   SpO2:  100%    Last Pain:  Vitals:   07/16/17 1249  TempSrc: Temporal  PainSc: 8          Complications: No apparent anesthesia complications

## 2017-07-17 ENCOUNTER — Encounter: Payer: Self-pay | Admitting: Obstetrics and Gynecology

## 2017-07-21 LAB — SURGICAL PATHOLOGY

## 2017-07-23 ENCOUNTER — Encounter: Payer: Medicaid Other | Admitting: Obstetrics and Gynecology

## 2017-10-22 ENCOUNTER — Encounter: Payer: Self-pay | Admitting: Emergency Medicine

## 2017-10-22 ENCOUNTER — Ambulatory Visit
Admission: EM | Admit: 2017-10-22 | Discharge: 2017-10-22 | Disposition: A | Discharge status: Home / Self Care | Payer: Medicaid Other | Attending: Internal Medicine | Admitting: Internal Medicine

## 2017-10-22 ENCOUNTER — Other Ambulatory Visit: Payer: Self-pay

## 2017-10-22 DIAGNOSIS — G43001 Migraine without aura, not intractable, with status migrainosus: Secondary | ICD-10-CM | POA: Diagnosis not present

## 2017-10-22 DIAGNOSIS — O99351 Diseases of the nervous system complicating pregnancy, first trimester: Secondary | ICD-10-CM | POA: Insufficient documentation

## 2017-10-22 DIAGNOSIS — G43009 Migraine without aura, not intractable, without status migrainosus: Secondary | ICD-10-CM | POA: Insufficient documentation

## 2017-10-22 DIAGNOSIS — Z331 Pregnant state, incidental: Secondary | ICD-10-CM

## 2017-10-22 DIAGNOSIS — R21 Rash and other nonspecific skin eruption: Secondary | ICD-10-CM

## 2017-10-22 DIAGNOSIS — Z3A10 10 weeks gestation of pregnancy: Secondary | ICD-10-CM | POA: Diagnosis not present

## 2017-10-22 LAB — URINALYSIS, COMPLETE (UACMP) WITH MICROSCOPIC
Bacteria, UA: NONE SEEN
Bilirubin Urine: NEGATIVE
Glucose, UA: NEGATIVE mg/dL
Hgb urine dipstick: NEGATIVE
Ketones, ur: NEGATIVE mg/dL
Leukocytes, UA: NEGATIVE
Nitrite: NEGATIVE
Protein, ur: NEGATIVE mg/dL
RBC / HPF: NONE SEEN RBC/hpf (ref 0–5)
Specific Gravity, Urine: 1.015 (ref 1.005–1.030)
Squamous Epithelial / LPF: NONE SEEN (ref 0–5)
pH: 7 (ref 5.0–8.0)

## 2017-10-22 MED ORDER — PERMETHRIN 5 % EX CREA: TOPICAL_CREAM | CUTANEOUS | 0 refills | Status: DC

## 2017-10-22 NOTE — ED Provider Notes (Signed)
[facility]    CSN: [id number] Arrival date & time: 10/22/17  1752     History   Chief Complaint Chief Complaint  Patient presents with  . Rash  . Migraine    HPI Teresa Ortiz is a 27 y.o. female.   Patient with past medical history most significant for 10 weeks pregnancy and migraine presents the emergency department complaining of headache worse than usual has been present in varying degrees almost constantly for the last 2 weeks.  During the same time the patient has had a pruritic rash that developed on her ankles and has gone up the lateral side of her legs.  Her trunk is spared but she has the same lesions on her arms.  Patient reports the itching is worse during the day.  She denies urinary symptoms, genital or oral lesions.  The patient denies joint pain or swelling.  She also denies fever but admits to vomiting usually in the evenings after her headache has peaked.  Vomiting makes her headache feel mildly better.  The patient also admits that her vision has worsened-more rapidly than expected, per her ophthalmologist.  The patient denies abdominal pain.     Past Medical History:  Diagnosis Date  . Asthma    childhood  . Frequent headaches   . Migraine without aura     Patient Active Problem List   Diagnosis Date Noted  . Advance care planning 06/13/2014  . Migraine without aura 05/11/2014  . Back pain 05/11/2014    Past Surgical History:  Procedure Laterality Date  . DILATION AND EVACUATION N/A 07/16/2017   Procedure: DILATATION AND EVACUATION;  Surgeon: Linzie Collin, MD;  Location: [facility] ORS;  Service: Gynecology;  Laterality: N/A;    OB History    Gravida  3   Para  2   Term  2   Preterm      AB      Living  2     SAB      TAB      Ectopic      Multiple  0   Live Births  2            Home Medications    Prior to Admission medications   Medication Sig Start Date End Date Taking? Authorizing Provider   Prenatal Multivit-Min-Fe-FA (PRENATAL VITAMINS PO) Take 1 tablet by mouth daily.   Yes [provider]  ibuprofen (ADVIL,MOTRIN) 200 MG tablet Take 800 mg by mouth every 8 (eight) hours as needed (for pain).    [provider]    Family History Family History  Problem Relation Age of Onset  . Mental illness Mother        bipolor/depressed  . Alcohol abuse Father     Social History Social History   Tobacco Use  . Smoking status: Never Smoker  . Smokeless tobacco: Never Used  Substance Use Topics  . Alcohol use: No    Alcohol/week: 0.0 oz    Comment: rarely  . Drug use: No     Allergies   Sulfa antibiotics   Review of Systems Review of Systems  Constitutional: Positive for fatigue. Negative for chills and fever.  HENT: Negative for congestion, facial swelling, mouth sores, sore throat and tinnitus.   Eyes: Negative for photophobia, redness and visual disturbance.  Respiratory: Negative for cough and shortness of breath.   Cardiovascular: Negative for chest pain and palpitations.  Gastrointestinal: Positive for nausea and vomiting. Negative for abdominal  pain and diarrhea.  Genitourinary: Negative for dysuria, frequency and urgency.  Musculoskeletal: Negative for arthralgias, joint swelling, myalgias and neck stiffness.  Skin: Positive for rash.       No lesions  Neurological: Positive for headaches. Negative for weakness.  Hematological: Does not bruise/bleed easily.  Psychiatric/Behavioral: Negative for suicidal ideas.     Physical Exam Triage Vital Signs ED Triage Vitals  Enc Vitals Group     BP 10/22/17 1801 116/64     Pulse Rate 10/22/17 1801 64     Resp 10/22/17 1801 16     Temp 10/22/17 1801 98.4 F (36.9 C)     Temp Source 10/22/17 1801 Oral     SpO2 10/22/17 1801 100 %     Weight 10/22/17 1802 190 lb (86.2 kg)     Height 10/22/17 1802 5\' 10"  (1.778 m)     Head Circumference --      Peak Flow --      Pain Score 10/22/17 1801 7      Pain Loc --      Pain Edu? --      Excl. in GC? --    No data found.  Updated Vital Signs BP 116/64 (BP Location: Left Arm)   Pulse 64   Temp 98.4 F (36.9 C) (Oral)   Resp 16   Ht 5\' 10"  (1.778 m)   Wt 190 lb (86.2 kg) Comment: [redacted] weeks pregnant  SpO2 100%   BMI 27.26 kg/m   Visual Acuity Right Eye Distance:   Left Eye Distance:   Bilateral Distance:    Right Eye Near:   Left Eye Near:    Bilateral Near:     Physical Exam  Constitutional: She is oriented to person, place, and time. She appears well-developed and well-nourished. No distress.  HENT:  Head: Normocephalic and atraumatic.  Mouth/Throat: Oropharynx is clear and moist.  Eyes: Pupils are equal, round, and reactive to light. Conjunctivae and EOM are normal. No scleral icterus.  Fundoscopic exam:      The right eye shows no AV nicking and no papilledema.       The left eye shows no AV nicking and no papilledema.  Neck: Normal range of motion. Neck supple. No JVD present. No tracheal deviation present. No thyromegaly present.  Cardiovascular: Normal rate, regular rhythm and normal heart sounds. Exam reveals no gallop and no friction rub.  No murmur heard. Pulmonary/Chest: Effort normal and breath sounds normal.  Abdominal: Soft. Bowel sounds are normal. She exhibits no distension. There is no tenderness.  Musculoskeletal: Normal range of motion. She exhibits no edema.  Lymphadenopathy:    She has no cervical adenopathy.  Neurological: She is alert and oriented to person, place, and time. No cranial nerve deficit.  Skin: Skin is warm and dry. Rash (Branching nummular rash affecting arms and legs.  It spares trunk and mucosal surfaces.  Pruritic.  Spares palms and soles) noted.  Psychiatric: She has a normal mood and affect. Her behavior is normal. Judgment and thought content normal.  Nursing note and vitals reviewed.    UC Treatments / Results  Labs (all labs ordered are listed, but only abnormal results  are displayed) Labs Reviewed  URINALYSIS, COMPLETE (UACMP) WITH MICROSCOPIC - Abnormal; Notable for the following components:      Result Value   Color, Urine STRAW (*)    APPearance HAZY (*)    All other components within normal limits    EKG None Radiology No results  found.  Procedures Procedures (including critical care time)  Medications Ordered in UC Medications - No data to display   Initial Impression / Assessment and Plan / UC Course  I have reviewed the triage vital signs and the nursing notes.  Pertinent labs & imaging results that were available during my care of the patient were reviewed by me and considered in my medical decision making (see chart for details).     Headache may be worsening migraines or early signs of preeclampsia.  Check urinalysis for protein.  Differential diagnosis of rash is pruritic urticarial papules of pregnancy, parvovirus, scabies, tick-bourne illness.  Patient has some symptoms that are not characteristic of any of those diagnoses.  Unclear if headache and rash are related.  --Urinalysis negative for proteinuria. Headache likely unlikely pre-eclampsia. More c/w migraine.   Final Clinical Impressions(s) / UC Diagnoses   Final diagnoses:  Rash and nonspecific skin eruption  Migraine without aura and with status migrainosus, not intractable    ED Discharge Orders    None       Controlled Substance Prescriptions [place] Controlled Substance Registry consulted? Not Applicable   Arnaldo Natal, MD 10/22/17 2000

## 2017-10-22 NOTE — ED Triage Notes (Addendum)
Patient in today c/o rash x 2 weeks on her arms and legs.  Patient also c/o severe headache causing emesis x 2 weeks.  Patient is [redacted] weeks pregnant. Patient has appointment with OB on Monday (10/27/17).

## 2017-10-27 ENCOUNTER — Ambulatory Visit (INDEPENDENT_AMBULATORY_CARE_PROVIDER_SITE_OTHER): Payer: Medicaid Other

## 2017-10-27 ENCOUNTER — Ambulatory Visit: Payer: Medicaid Other | Admitting: Certified Nurse Midwife

## 2017-10-27 VITALS — BP 114/74 | HR 72 | Wt 187.3 lb

## 2017-10-27 DIAGNOSIS — Z3481 Encounter for supervision of other normal pregnancy, first trimester: Secondary | ICD-10-CM | POA: Diagnosis not present

## 2017-10-27 LAB — OB RESULTS CONSOLE VARICELLA ZOSTER ANTIBODY, IGG: Varicella: IMMUNE

## 2017-10-27 NOTE — Progress Notes (Signed)
Teresa Ortiz presents for NOB nurse interview visit. Pregnancy confirmation done _[facility]_____.  G-4 .  P2 0 1 2-    . Pregnancy education material explained and given. _0__ cats in the home. NOB labs ordered.  HIV labs and Drug screen were explained optional and she did not decline. Drug screen ordered. PNV encouraged. Genetic screening options discussed. Genetic testing: Ordered.  Pt may discuss with provider. Pt. To follow up with provider in _2_ weeks for NOB physical.  All questions answered.

## 2017-10-28 ENCOUNTER — Encounter: Payer: Self-pay | Admitting: *Deleted

## 2017-10-28 LAB — URINALYSIS, ROUTINE W REFLEX MICROSCOPIC
Bilirubin, UA: NEGATIVE
Glucose, UA: NEGATIVE
KETONES UA: NEGATIVE
NITRITE UA: NEGATIVE
Protein, UA: NEGATIVE
RBC, UA: NEGATIVE
SPEC GRAV UA: 1.021 (ref 1.005–1.030)
Urobilinogen, Ur: 0.2 mg/dL (ref 0.2–1.0)
pH, UA: 6 (ref 5.0–7.5)

## 2017-10-28 LAB — CBC WITH DIFFERENTIAL
BASOS ABS: 0 10*3/uL (ref 0.0–0.2)
BASOS: 0 %
EOS (ABSOLUTE): 0.1 10*3/uL (ref 0.0–0.4)
Eos: 1 %
HEMOGLOBIN: 12.5 g/dL (ref 11.1–15.9)
Hematocrit: 38.3 % (ref 34.0–46.6)
Immature Grans (Abs): 0 10*3/uL (ref 0.0–0.1)
Immature Granulocytes: 0 %
Lymphocytes Absolute: 1.1 10*3/uL (ref 0.7–3.1)
Lymphs: 22 %
MCH: 25.1 pg — AB (ref 26.6–33.0)
MCHC: 32.6 g/dL (ref 31.5–35.7)
MCV: 77 fL — AB (ref 79–97)
MONOCYTES: 7 %
Monocytes Absolute: 0.3 10*3/uL (ref 0.1–0.9)
NEUTROS ABS: 3.5 10*3/uL (ref 1.4–7.0)
NEUTROS PCT: 70 %
RBC: 4.98 x10E6/uL (ref 3.77–5.28)
RDW: 17 % — ABNORMAL HIGH (ref 12.3–15.4)
WBC: 5 10*3/uL (ref 3.4–10.8)

## 2017-10-28 LAB — HIV ANTIBODY (ROUTINE TESTING W REFLEX): HIV Screen 4th Generation wRfx: NONREACTIVE

## 2017-10-28 LAB — HEPATITIS B SURFACE ANTIGEN: HEP B S AG: NEGATIVE

## 2017-10-28 LAB — ABO AND RH: Rh Factor: POSITIVE

## 2017-10-28 LAB — RUBELLA SCREEN

## 2017-10-28 LAB — TOXOPLASMA ANTIBODIES- IGG AND  IGM: Toxoplasma Antibody- IgM: <3 AU/mL (ref 0.0–7.9)

## 2017-10-28 LAB — RPR: RPR: NONREACTIVE

## 2017-10-28 LAB — MICROSCOPIC EXAMINATION: CASTS: NONE SEEN /LPF

## 2017-10-28 LAB — ANTIBODY SCREEN: Antibody Screen: NEGATIVE

## 2017-10-28 LAB — VARICELLA ZOSTER ANTIBODY, IGG: Varicella zoster IgG: 1127 index (ref >165)

## 2017-10-29 ENCOUNTER — Encounter: Payer: Self-pay | Admitting: Certified Nurse Midwife

## 2017-10-29 LAB — MONITOR DRUG PROFILE 14(MW)
AMPHETAMINE SCREEN URINE: NEGATIVE ng/mL
BARBITURATE SCREEN URINE: NEGATIVE ng/mL
BENZODIAZEPINE SCREEN, URINE: NEGATIVE ng/mL
BUPRENORPHINE, URINE: NEGATIVE ng/mL
CANNABINOIDS UR QL SCN: NEGATIVE ng/mL
Cocaine (Metab) Scrn, Ur: NEGATIVE ng/mL
Creatinine(Crt), U: 142.4 mg/dL (ref 20.0–300.0)
FENTANYL, URINE: NEGATIVE pg/mL
Meperidine Screen, Urine: NEGATIVE ng/mL
Methadone Screen, Urine: NEGATIVE ng/mL
OXYCODONE+OXYMORPHONE UR QL SCN: NEGATIVE ng/mL
Opiate Scrn, Ur: NEGATIVE ng/mL
PH UR, DRUG SCRN: 6.4 (ref 4.5–8.9)
PHENCYCLIDINE QUANTITATIVE URINE: NEGATIVE ng/mL
Propoxyphene Scrn, Ur: NEGATIVE ng/mL
SPECIFIC GRAVITY: 1.022
Tramadol Screen, Urine: NEGATIVE ng/mL

## 2017-10-29 LAB — GC/CHLAMYDIA PROBE AMP
Chlamydia trachomatis, NAA: NEGATIVE
Neisseria gonorrhoeae by PCR: NEGATIVE

## 2017-10-29 LAB — NICOTINE SCREEN, URINE: COTININE UR QL SCN: NEGATIVE ng/mL

## 2017-10-29 LAB — URINE CULTURE: Organism ID, Bacteria: NO GROWTH

## 2017-11-03 ENCOUNTER — Encounter: Payer: Self-pay | Admitting: Certified Nurse Midwife

## 2017-11-03 ENCOUNTER — Ambulatory Visit (INDEPENDENT_AMBULATORY_CARE_PROVIDER_SITE_OTHER): Payer: Medicaid Other | Admitting: Certified Nurse Midwife

## 2017-11-03 VITALS — BP 98/58 | HR 84 | Wt 189.4 lb

## 2017-11-03 DIAGNOSIS — Z3481 Encounter for supervision of other normal pregnancy, first trimester: Secondary | ICD-10-CM

## 2017-11-03 LAB — POCT URINALYSIS DIPSTICK
Bilirubin, UA: NEGATIVE
Glucose, UA: NEGATIVE
Ketones, UA: NEGATIVE
LEUKOCYTES UA: NEGATIVE
NITRITE UA: NEGATIVE
RBC UA: NEGATIVE
SPEC GRAV UA: 1.025 (ref 1.010–1.025)
Urobilinogen, UA: 0.2 E.U./dL
pH, UA: 6.5 (ref 5.0–8.0)

## 2017-11-03 MED ORDER — DOXYLAMINE-PYRIDOXINE ER 20-20 MG PO TBCR
1.0000 | EXTENDED_RELEASE_TABLET | Freq: Two times a day (BID) | ORAL | 4 refills | Status: DC
Start: 2017-11-03 — End: 2017-12-10

## 2017-11-03 NOTE — Progress Notes (Signed)
[name]- pt think she may have a yeast infection. pt having itching and burning since Friday. Noticed white thick discharge on Sunday. Took monistat on Sunday night. Pt stated that she thinks monistat is helping.

## 2017-11-03 NOTE — Progress Notes (Signed)
NEW OB HISTORY AND PHYSICAL  SUBJECTIVE:       Teresa Ortiz is a 27 y.o. [phone] female, Patient's last menstrual period was 08/12/2017 (exact date)., Estimated Date of Delivery: 05/19/18, [redacted]w[redacted]d, presents today for establishment of Prenatal Care.  She has complaints of nausea and vomiting and complains of white vaginal discharge with itching. Patient has been using Monistat 3 since Sunday and has one more day left to complete treatment. Patient denies any new sexual partners or suspicion of STD. Patient has hx of SAB @ approximately 6 weeks with D&C. Patient does wish to breastfeed after pregnancy.     Gynecologic History Patient's last menstrual period was 08/12/2017 (exact date). Normal Contraception: none Last Pap: 06/082017. Results were: normal  Obstetric History OB History  Gravida Para Term Preterm AB Living  SAB TAB Ectopic Multiple Live Births  1     0 2    # Outcome Date GA Lbr Len/2nd Weight Sex Delivery Anes PTL Lv  4 Current           3 SAB 2019          2 Term 06/01/16 [redacted]w[redacted]d / 00:14 6 lb 1 oz (2.75 kg) M Vag-Spont EPI  LIV  1 Term 2011   6 lb 6.4 oz (2.903 kg) F Vag-Spont   LIV    Past Medical History:  Diagnosis Date  . Asthma    childhood  . Frequent headaches   . Migraine without aura     Past Surgical History:  Procedure Laterality Date  . DILATION AND EVACUATION N/A 07/16/2017   Procedure: DILATATION AND EVACUATION;  Surgeon: Linzie Collin, MD;  Location: [facility];  Service: Gynecology;  Laterality: N/A;    Current Outpatient Medications on File Prior to Visit  Medication Sig Dispense Refill  . ibuprofen (ADVIL,MOTRIN) 200 MG tablet Take 800 mg by mouth every 8 (eight) hours as needed (for pain).    . permethrin (ELIMITE) 5 % cream Apply to affected area once. Rinse the next morning. (Patient not taking: Reported on 10/27/2017) 60 g 0  . Prenatal Multivit-Min-Fe-FA (PRENATAL VITAMINS PO) Take 1 tablet by mouth daily.     No  current facility-administered medications on file prior to visit.     Allergies  Allergen Reactions  . Sulfa Antibiotics Rash    Social History   Socioeconomic History  . Marital status: Single    Spouse name: Not on file  . Number of children: Not on file  . Years of education: Not on file  . Highest education level: Not on file  Occupational History  . Not on file  Social Needs  . Financial resource strain: Not on file  . Food insecurity:    Worry: Not on file    Inability: Not on file  . Transportation needs:    Medical: Not on file    Non-medical: Not on file  Tobacco Use  . Smoking status: Never Smoker  . Smokeless tobacco: Never Used  Substance and Sexual Activity  . Alcohol use: No    Alcohol/week: 0.0 oz    Comment: rarely  . Drug use: No  . Sexual activity: Yes    Birth control/protection: None  Lifestyle  . Physical activity:    Days per week: Not on file    Minutes per session: Not on file  . Stress: Not on file  Relationships  . Social connections:    Talks on phone:  Not on file    Gets together: Not on file    Attends religious service: Not on file    Active member of club or organization: Not on file    Attends meetings of clubs or organizations: Not on file    Relationship status: Not on file  . Intimate partner violence:    Fear of current or ex partner: Not on file    Emotionally abused: Not on file    Physically abused: Not on file    Forced sexual activity: Not on file  Other Topics Concern  . Not on file  Social History Narrative   1 child   Lives with boyfriend and his 2 kids    Family History  Problem Relation Age of Onset  . Mental illness Mother        bipolor/depressed  . Alcohol abuse Father     The following portions of the patient's history were reviewed and updated as appropriate: allergies, current medications, past OB history, past medical history, past surgical history, past family history, past social history, and  problem list.    OBJECTIVE: Initial Physical Exam (New OB)  GENERAL APPEARANCE: alert, well appearing, in no apparent distress, oriented to person, place and time, well hydrated HEAD: normocephalic, atraumatic MOUTH: mucous membranes moist, pharynx normal without lesions THYROID: no thyromegaly or masses present BREASTS: no masses noted, no significant tenderness, no palpable axillary nodes, no skin changes LUNGS: clear to auscultation, no wheezes, rales or rhonchi, symmetric air entry HEART: regular rate and rhythm, no murmurs ABDOMEN: soft, nontender, nondistended, no abnormal masses, no epigastric pain EXTREMITIES: no redness or tenderness in the calves or thighs SKIN: normal coloration and turgor, no rashes LYMPH NODES: no adenopathy palpable NEUROLOGIC: alert, oriented, normal speech, no focal findings or movement disorder noted PELVIC EXAM  EXTERNAL GENITALIA: normal appearing vulva with no masses, redness noted  No tenderness or lesions VAGINA, redness,particulate white,non odorous discharge UTERUS: gravid, FHT present  ADNEXA: no masses palpable and nontender RECTUM: exam not indicated  ASSESSMENT: Normal pregnancy   PLAN: New OB counseling: The patient has been given an overview regarding routine prenatal care. Recommendations regarding diet, weight gain, and exercise in pregnancy were given. Prenatal testing, optional genetic testing, and ultrasound use in pregnancy were reviewed.  Benefits of Breast Feeding were discussed. The patient is encouraged to consider nursing her baby post partum. Bonjesta ordered for nausea & vomiting. Follow up as needed if vaginal itching/discharge does not subside.  RTC in 4 weeks.    Shanika Creacy,SNM/Mossie Gilder,CNM

## 2017-11-03 NOTE — Patient Instructions (Signed)

## 2017-12-03 ENCOUNTER — Encounter: Payer: Medicaid Other | Admitting: Obstetrics and Gynecology

## 2017-12-09 ENCOUNTER — Encounter: Payer: Medicaid Other | Admitting: Obstetrics and Gynecology

## 2017-12-09 ENCOUNTER — Encounter: Payer: Medicaid Other | Admitting: Certified Nurse Midwife

## 2017-12-10 ENCOUNTER — Ambulatory Visit (INDEPENDENT_AMBULATORY_CARE_PROVIDER_SITE_OTHER): Payer: Medicaid Other | Admitting: Certified Nurse Midwife

## 2017-12-10 ENCOUNTER — Encounter: Payer: Self-pay | Admitting: Certified Nurse Midwife

## 2017-12-10 VITALS — BP 110/67 | HR 70 | Wt 196.3 lb

## 2017-12-10 DIAGNOSIS — Z3482 Encounter for supervision of other normal pregnancy, second trimester: Secondary | ICD-10-CM

## 2017-12-10 LAB — POCT URINALYSIS DIPSTICK
Bilirubin, UA: NEGATIVE
Glucose, UA: NEGATIVE
Ketones, UA: NEGATIVE
LEUKOCYTES UA: NEGATIVE
NITRITE UA: NEGATIVE
PH UA: 7.5 (ref 5.0–8.0)
PROTEIN UA: NEGATIVE
RBC UA: NEGATIVE
SPEC GRAV UA: 1.015 (ref 1.010–1.025)
Urobilinogen, UA: 0.2 E.U./dL

## 2017-12-10 NOTE — Progress Notes (Signed)
[name], doing well. Feeling movemement. State that nausea & vomiting has improved significantly. Discussed anatomy u/s @ next visit in 3 wks. Round ligament pain reviewed. Follow up 3 wks.   Doreene BurkeAnnie Kaileena Obi, CNM

## 2017-12-10 NOTE — Progress Notes (Signed)
Pt is here for an [name] visit. 

## 2017-12-10 NOTE — Patient Instructions (Signed)

## 2017-12-31 ENCOUNTER — Other Ambulatory Visit: Payer: Medicaid Other

## 2017-12-31 ENCOUNTER — Other Ambulatory Visit: Payer: Self-pay

## 2017-12-31 ENCOUNTER — Encounter: Payer: Medicaid Other | Admitting: Certified Nurse Midwife

## 2017-12-31 DIAGNOSIS — Z3482 Encounter for supervision of other normal pregnancy, second trimester: Secondary | ICD-10-CM

## 2018-01-02 ENCOUNTER — Ambulatory Visit (INDEPENDENT_AMBULATORY_CARE_PROVIDER_SITE_OTHER): Payer: Medicaid Other | Admitting: Certified Nurse Midwife

## 2018-01-02 ENCOUNTER — Ambulatory Visit (INDEPENDENT_AMBULATORY_CARE_PROVIDER_SITE_OTHER): Payer: Medicaid Other

## 2018-01-02 ENCOUNTER — Encounter: Payer: Self-pay | Admitting: Certified Nurse Midwife

## 2018-01-02 VITALS — BP 103/68 | HR 86 | Wt 196.8 lb

## 2018-01-02 DIAGNOSIS — Z3482 Encounter for supervision of other normal pregnancy, second trimester: Secondary | ICD-10-CM

## 2018-01-02 DIAGNOSIS — Z363 Encounter for antenatal screening for malformations: Secondary | ICD-10-CM | POA: Diagnosis not present

## 2018-01-02 LAB — POCT URINALYSIS DIPSTICK
Blood, UA: NEGATIVE
Glucose, UA: NEGATIVE
Ketones, UA: NEGATIVE
Nitrite, UA: NEGATIVE
ODOR: NEGATIVE
Protein, UA: NEGATIVE
SPEC GRAV UA: 1.02 (ref 1.010–1.025)
UROBILINOGEN UA: 0.2 U/dL
pH, UA: 6.5 (ref 5.0–8.0)

## 2018-01-02 NOTE — Patient Instructions (Signed)

## 2018-01-02 NOTE — Progress Notes (Signed)
[name] and anatomy- Lower back, wrist and ankle pain.

## 2018-01-02 NOTE — Progress Notes (Signed)
[name], doing well. No complaints. Anatomy u/s today . Results reviewed. ( see below). Follow up 4 wks.   Doreene BurkeAnnie Muntaha Vermette, CNM  Body mass index is 28.24 kg/m.   ULTRASOUND REPORT  Location: [facility] Date of Service:  01/02/2018  Indications: Anatomy  Findings:  Singleton intrauterine pregnancy is visualized with FHR at 143 BPM. Biometrics give an (U/S) Gestational age of 28 2/7 weeks and an (U/S) EDD of 05/20/18; this correlates with the clinically established EDD of 05/19/18.  Fetal presentation is breech.  EFW: 360 grams (0lb 13oz). Placenta: Anterior and grade 1. AFI: WNL subjectively.  Anatomic survey is complete and appears WNL; Gender - Female.   Right Ovary measures 2.9 x 1.9 x 1.6 cm. It is normal in appearance. Left Ovary measures 2.3 x 1.7 x 1.1 cm. It is normal appearance. There is no obvious evidence of a corpus luteal cyst. Survey of the adnexa demonstrates no adnexal masses. There is no free peritoneal fluid in the cul de sac.  Impression: 1. 20 2/7 week Viable Singleton Intrauterine pregnancy by U/S. 2. (U/S) EDD is consistent with Clinically established (LMP) EDD of 05/19/18. 3. Normal Anatomy Scan  Recommendations: 1.Clinical correlation with the patient's History and Physical Exam.   Kari BaarsJill Long, RDMS

## 2018-01-30 ENCOUNTER — Observation Stay
Admission: EM | Admit: 2018-01-30 | Discharge: 2018-01-30 | Disposition: A | Discharge status: Home / Self Care | Payer: Medicaid Other | Attending: Obstetrics and Gynecology | Admitting: Obstetrics and Gynecology

## 2018-01-30 ENCOUNTER — Other Ambulatory Visit: Payer: Self-pay

## 2018-01-30 DIAGNOSIS — O26893 Other specified pregnancy related conditions, third trimester: Principal | ICD-10-CM | POA: Insufficient documentation

## 2018-01-30 DIAGNOSIS — Z3A24 24 weeks gestation of pregnancy: Secondary | ICD-10-CM | POA: Diagnosis not present

## 2018-01-30 DIAGNOSIS — R102 Pelvic and perineal pain: Secondary | ICD-10-CM | POA: Insufficient documentation

## 2018-01-30 DIAGNOSIS — O9989 Other specified diseases and conditions complicating pregnancy, childbirth and the puerperium: Secondary | ICD-10-CM | POA: Diagnosis present

## 2018-01-30 DIAGNOSIS — Z3490 Encounter for supervision of normal pregnancy, unspecified, unspecified trimester: Secondary | ICD-10-CM

## 2018-01-30 LAB — URINALYSIS, ROUTINE W REFLEX MICROSCOPIC
BILIRUBIN URINE: NEGATIVE
GLUCOSE, UA: NEGATIVE mg/dL
Hgb urine dipstick: NEGATIVE
Ketones, ur: NEGATIVE mg/dL
Leukocytes, UA: NEGATIVE
NITRITE: NEGATIVE
PH: 6 (ref 5.0–8.0)
Protein, ur: NEGATIVE mg/dL
SPECIFIC GRAVITY, URINE: 1.024 (ref 1.005–1.030)

## 2018-01-30 LAB — ROM PLUS (ARMC ONLY): Rom Plus: NEGATIVE

## 2018-01-30 MED ORDER — ACETAMINOPHEN 500 MG PO TABS
ORAL_TABLET | ORAL | Status: AC
  Filled 2018-01-30: qty 2

## 2018-01-30 MED ORDER — ACETAMINOPHEN 500 MG PO TABS
1000.0000 mg | ORAL_TABLET | Freq: Four times a day (QID) | ORAL | Status: DC | PRN
  Administered 2018-01-30: 1000 mg via ORAL

## 2018-01-30 NOTE — OB Triage Note (Signed)
Pt presents c/o lower abdominal cramping described as "after birth cramps". Pt also c/o her underwear being wet and having to change them frequently in the day. She also states that she is having pain when she wipes after using the bathroom. Pt states the abdominal cramping has got worse which is why she came in. Pt also states while sitting at work both of her "legs felt like they went to sleep." Pt rates abdominal pain 6/10. Patient denies any gushes of fluid but states she has had watery discharge (Nitrazine negative). Denies any bleeding. No recent intercourse. Vitals WNL. Will continue to monitor.

## 2018-01-30 NOTE — OB Triage Note (Signed)
  L&D OB Triage Note  SUBJECTIVE Teresa Ortiz is a 27 y.o. [id number] female at 4766w3d, EDD Estimated Date of Delivery: 05/19/18 who presented to triage with complaints of vaginal discharge and lower abdominal pain. Denies vaginal bleeding and feels fetal movement.   OB History  Gravida Para Term Preterm AB Living  4 2 2  0 1 2  SAB TAB Ectopic Multiple Live Births  1 0 0 0 2    # Outcome Date GA Lbr Len/2nd Weight Sex Delivery Anes PTL Lv  4 Current           3 SAB 2019          2 Term 06/01/16 10235w0d / 00:14 6 lb 1 oz (2.75 kg) M Vag-Spont EPI  LIV     Name: Guarnieri,BOY Aarilyn     Apgar1: 8  Apgar5: 9  1 Term 2011   6 lb 6.4 oz (2.903 kg) F Vag-Spont   LIV    Medications Prior to Admission  Medication Sig Dispense Refill Last Dose  . Prenatal Multivit-Min-Fe-FA (PRENATAL VITAMINS PO) Take 1 tablet by mouth daily.   01/30/2018 at Unknown time     OBJECTIVE  Nursing Evaluation:   BP (!) 107/58 (BP Location: Left Arm)   Pulse 75   Temp 97.9 F (36.6 C) (Oral)   Resp 16   Ht 5\' 10"  (1.778 m)   Wt 196 lb (88.9 kg)   LMP 08/12/2017 (Exact Date)   BMI 28.12 kg/m    Findings:   NST appropriate for gestational age  NST was performed and has been reviewed by me.  NST INTERPRETATION: Category I  Mode: External Baseline Rate (A): 140 bpm Variability: Moderate Accelerations: 15 x 15 Decelerations: Variable     Contraction Frequency (min): none  ASSESSMENT Impression:  1.  Pregnancy:  [id number] at 466w3d , EDD Estimated Date of Delivery: 05/19/18 2.  NST:  Category I  3. Round ligament pain. Resolved with tylenol  4. Rom plus negative  PLAN 1. Reassurance given 2. Discharge home with standard labor precautions given to return to L&D or call the office for problems. 3. Continue routine prenatal care.  Doreene BurkeAnnie Nayara Taplin, CNM

## 2018-02-03 ENCOUNTER — Ambulatory Visit (INDEPENDENT_AMBULATORY_CARE_PROVIDER_SITE_OTHER): Payer: Medicaid Other | Admitting: Obstetrics and Gynecology

## 2018-02-03 VITALS — BP 105/68 | HR 77 | Wt 203.0 lb

## 2018-02-03 DIAGNOSIS — Z3493 Encounter for supervision of normal pregnancy, unspecified, third trimester: Secondary | ICD-10-CM | POA: Diagnosis not present

## 2018-02-03 LAB — POCT URINALYSIS DIPSTICK
Bilirubin, UA: NEGATIVE
Glucose, UA: NEGATIVE
Ketones, UA: NEGATIVE
LEUKOCYTES UA: NEGATIVE
NITRITE UA: NEGATIVE
PH UA: 7 (ref 5.0–8.0)
PROTEIN UA: NEGATIVE
RBC UA: NEGATIVE
Spec Grav, UA: 1.015 (ref 1.010–1.025)
Urobilinogen, UA: 0.2 E.U./dL

## 2018-02-03 NOTE — Progress Notes (Signed)
[name]- pt is doing well 

## 2018-02-03 NOTE — Progress Notes (Signed)
[name]- doing well,still having lower abdominal round ligament pains- offered maternity belt. None in stock will call. Also note given for dental xray as she is far enough along and needs filling replaced.

## 2018-02-24 ENCOUNTER — Other Ambulatory Visit: Payer: Medicaid Other

## 2018-02-24 ENCOUNTER — Ambulatory Visit (INDEPENDENT_AMBULATORY_CARE_PROVIDER_SITE_OTHER): Payer: Medicaid Other | Admitting: Certified Nurse Midwife

## 2018-02-24 ENCOUNTER — Encounter: Payer: Self-pay | Admitting: Certified Nurse Midwife

## 2018-02-24 VITALS — BP 109/61 | HR 88 | Wt 208.4 lb

## 2018-02-24 DIAGNOSIS — Z23 Encounter for immunization: Secondary | ICD-10-CM

## 2018-02-24 DIAGNOSIS — O09899 Supervision of other high risk pregnancies, unspecified trimester: Secondary | ICD-10-CM

## 2018-02-24 DIAGNOSIS — Z3493 Encounter for supervision of normal pregnancy, unspecified, third trimester: Secondary | ICD-10-CM

## 2018-02-24 DIAGNOSIS — Z2839 Other underimmunization status: Secondary | ICD-10-CM

## 2018-02-24 DIAGNOSIS — Z283 Underimmunization status: Secondary | ICD-10-CM

## 2018-02-24 DIAGNOSIS — Z3A28 28 weeks gestation of pregnancy: Secondary | ICD-10-CM | POA: Diagnosis not present

## 2018-02-24 DIAGNOSIS — O9989 Other specified diseases and conditions complicating pregnancy, childbirth and the puerperium: Secondary | ICD-10-CM

## 2018-02-24 LAB — POCT URINALYSIS DIPSTICK OB
BILIRUBIN UA: NEGATIVE
Blood, UA: NEGATIVE
Glucose, UA: NEGATIVE — AB
KETONES UA: NEGATIVE
Leukocytes, UA: NEGATIVE
Nitrite, UA: NEGATIVE
PH UA: 7.5 (ref 5.0–8.0)
Spec Grav, UA: 1.01 (ref 1.010–1.025)
UROBILINOGEN UA: 0.2 U/dL

## 2018-02-24 MED ORDER — TETANUS-DIPHTH-ACELL PERTUSSIS 5-2.5-18.5 LF-MCG/0.5 IM SUSP
0.5000 mL | Freq: Once | INTRAMUSCULAR | Status: AC
  Administered 2018-02-24: 0.5 mL via INTRAMUSCULAR

## 2018-02-24 NOTE — Patient Instructions (Signed)
WHAT OB PATIENTS CAN EXPECT   Confirmation of pregnancy and ultrasound ordered if medically indicated-[redacted] weeks gestation  New OB (NOB) intake with nurse and New OB (NOB) labs- [redacted] weeks gestation  New OB (NOB) physical examination with provider- 11/[redacted] weeks gestation  Flu vaccine-[redacted] weeks gestation  Anatomy scan-[redacted] weeks gestation  Glucose tolerance test, blood work to test for anemia, T-dap vaccine-[redacted] weeks gestation  Vaginal swabs/cultures-STD/Group B strep-[redacted] weeks gestation  Appointments every 4 weeks until 28 weeks  Every 2 weeks from 28 weeks until 36 weeks  Weekly visits from 36 weeks until delivery  Pain Relief During Labor and Delivery Many things can cause pain during labor and delivery, including:  Pressure on bones and ligaments due to the baby moving through the pelvis.  Stretching of tissues due to the baby moving through the birth canal.  Muscle tension due to anxiety or nervousness.  The uterus tightening (contracting) and relaxing to help move the baby.  There are many ways to deal with the pain of labor and delivery. They include:  Taking prenatal classes. Taking these classes helps you know what to expect during your baby's birth. What you learn will increase your confidence and decrease your anxiety.  Practicing relaxation techniques or doing relaxing activities, such as: ? Focused breathing. ? Meditation. ? Visualization. ? Aroma therapy. ? Listening to your favorite music. ? Hypnosis.  Taking a warm shower or bath (hydrotherapy). This may: ? Provide comfort and relaxation. ? Lessen your perception of pain. ? Decrease the amount of pain medicine needed. ? Decrease the length of labor.  Getting a massage or counterpressure on your back.  Applying warm packs or ice packs.  Changing positions often, moving around, or using a birthing ball.  Getting: ? Pain medicine through an IV or injection into a muscle. ? Pain medicine inserted into your  spinal column. ? Injections of sterile water just under the skin on your lower back (intradermal injections). ? Laughing gas (nitrous oxide).  Discuss your pain control options with your health care provider during your prenatal visits. Explore the options offered by your hospital or birth center. What kinds of medicine are available? There are two kinds of medicines that can be used to relieve pain during labor and delivery:  Analgesics. These medicines decrease pain without causing you to lose feeling or the ability to move your muscles.  Anesthetics. These medicines block feeling in the body and can decrease your ability to move freely.  Both of these kinds of medicine can cause minor side effects, such as nausea, trouble concentrating, and sleepiness. They can also decrease the baby's heart rate before birth and affect the baby's breathing rate after birth. For this reason, health care providers are careful about when and how much medicine is given. What are specific medicines and procedures that provide pain relief? Local Anesthetics Local anesthetics are used to numb a small area of the body. They may be used along with another kind of anesthetic or used to numb the nerves of the vagina, cervix, and perineum during the second stage of labor. General Anesthetics General anesthetics cause you to lose consciousness so you do not feel pain. They are usually only used for an emergency cesarean delivery. General anesthetics are given through an IV tube and a mask. Pudendal Block A pudendal block is a form of local anesthetic. It may be used to relieve the pain associated with pushing or stretching of the perineum at the time of delivery or to   further numb the perineum. A pudendal block is done by injecting numbing medicine through the vaginal wall into a nerve in the pelvis. Epidural Analgesia Epidural analgesia is given through a flexible IV catheter that is inserted into the lower back.  Numbing medicine is delivered continuously to the area near your spinal column nerves (epidural space). After having this type of analgesia, you may be able to move your legs but you most likely will not be able to walk. Depending on the amount of medicine given, you may lose all feeling in the lower half of your body, or you may retain some level of sensation, including the urge to push. Epidural analgesia can be used to provide pain relief for a vaginal birth. Spinal Block A spinal block is similar to epidural analgesia, but the medicine is injected into the spinal fluid instead of the epidural space. A spinal block is only given once. It starts to relieve pain quickly, but the pain relief lasts only 1-6 hours. Spinal blocks can be used for cesarean deliveries. Combined Spinal-Epidural (CSE) Block A CSE block combines the effects of a spinal block and epidural analgesia. The spinal block works quickly to block all pain. The epidural analgesia provides continuous pain relief, even after the effects of the spinal block have worn off. This information is not intended to replace advice given to you by your health care provider. Make sure you discuss any questions you have with your health care provider. Document Released: 10/03/2008 Document Revised: 11/24/2015 Document Reviewed: 11/08/2015 Elsevier Interactive Patient Education  2018 [employer]. Back Pain in Pregnancy Back pain during pregnancy is common. Back pain may be caused by several factors that are related to changes during your pregnancy. Follow these instructions at home: Managing pain, stiffness, and swelling  If directed, apply ice for sudden (acute) back pain. ? Put ice in a plastic bag. ? Place a towel between your skin and the bag. ? Leave the ice on for 20 minutes, 2-3 times per day.  If directed, apply heat to the affected area before you exercise: ? Place a towel between your skin and the heat pack or heating pad. ? Leave the  heat on for 20-30 minutes. ? Remove the heat if your skin turns bright red. This is especially important if you are unable to feel pain, heat, or cold. You may have a greater risk of getting burned. Activity  Exercise as told by your health care provider. Exercising is the best way to prevent or manage back pain.  Listen to your body when lifting. If lifting hurts, ask for help or bend your knees. This uses your leg muscles instead of your back muscles.  Squat down when picking up something from the floor. Do not bend over.  Only use bed rest as told by your health care provider. Bed rest should only be used for the most severe episodes of back pain. Standing, Sitting, and Lying Down  Do not stand in one place for long periods of time.  Use good posture when sitting. Make sure your head rests over your shoulders and is not hanging forward. Use a pillow on your lower back if necessary.  Try sleeping on your side, preferably the left side, with a pillow or two between your legs. If you are sore after a night's rest, your bed may be too soft. A firm mattress may provide more support for your back during pregnancy. General instructions  Do not wear high heels.  Eat a  healthy diet. Try to gain weight within your health care provider's recommendations.  Use a maternity girdle, elastic sling, or back brace as told by your health care provider.  Take over-the-counter and prescription medicines only as told by your health care provider.  Keep all follow-up visits as told by your health care provider. This is important. This includes any visits with any specialists, such as a physical therapist. Contact a health care provider if:  Your back pain interferes with your daily activities.  You have increasing pain in other parts of your body. Get help right away if:  You develop numbness, tingling, weakness, or problems with the use of your arms or legs.  You develop severe back pain that is  not controlled with medicine.  You have a sudden change in bowel or bladder control.  You develop shortness of breath, dizziness, or you faint.  You develop nausea, vomiting, or sweating.  You have back pain that is a rhythmic, cramping pain similar to labor pains. Labor pain is usually 1-2 minutes apart, lasts for about 1 minute, and involves a bearing down feeling or pressure in your pelvis.  You have back pain and your water breaks or you have vaginal bleeding.  You have back pain or numbness that travels down your leg.  Your back pain developed after you fell.  You develop pain on one side of your back.  You see blood in your urine.  You develop skin blisters in the area of your back pain. This information is not intended to replace advice given to you by your health care provider. Make sure you discuss any questions you have with your health care provider. Document Released: 09/25/2005 Document Revised: 11/23/2015 Document Reviewed: 03/01/2015 Elsevier Interactive Patient Education  2018 [employer]. Common Medications Safe in Pregnancy  Acne:      Constipation:  Benzoyl Peroxide     Colace  Clindamycin      Dulcolax Suppository  Topica Erythromycin     Fibercon  Salicylic Acid      Metamucil         Miralax AVOID:        Senakot   Accutane    Cough:  Retin-A       Cough Drops  Tetracycline      Phenergan w/ Codeine if Rx  Minocycline      Robitussin (Plain & DM)  Antibiotics:     Crabs/Lice:  Ceclor       RID  Cephalosporins    AVOID:  E-Mycins      Kwell  Keflex  Macrobid/Macrodantin   Diarrhea:  Penicillin      Kao-Pectate  Zithromax      Imodium AD         PUSH FLUIDS AVOID:       Cipro     Fever:  Tetracycline      Tylenol (Regular or Extra  Minocycline       Strength)  Levaquin      Extra Strength-Do not          Exceed 8 tabs/24 hrs Caffeine:        '200mg'$ /day (equiv. To 1 cup of coffee or  approx. 3 12 oz  sodas)         Gas: Cold/Hayfever:       Gas-X  Benadryl      Mylicon  Claritin       Phazyme  **Claritin-D        Chlor-Trimeton  Headaches:  Dimetapp      ASA-Free Excedrin  Drixoral-Non-Drowsy     Cold Compress  Mucinex (Guaifenasin)     Tylenol (Regular or Extra  Sudafed/Sudafed-12 Hour     Strength)  **Sudafed PE Pseudoephedrine   Tylenol Cold & Sinus     Vicks Vapor Rub  Zyrtec  **AVOID if Problems With Blood Pressure         Heartburn: Avoid lying down for at least 1 hour after meals  Aciphex      Maalox     Rash:  Milk of Magnesia     Benadryl    Mylanta       1% Hydrocortisone Cream  Pepcid  Pepcid Complete   Sleep Aids:  Prevacid      Ambien   Prilosec       Benadryl  Rolaids       Chamomile Tea  Tums (Limit 4/day)     Unisom  Zantac       Tylenol PM         Warm milk-add vanilla or  Hemorrhoids:       Sugar for taste  Anusol/Anusol H.C.  (RX: Analapram 2.5%)  Sugar Substitutes:  Hydrocortisone OTC     Ok in moderation  Preparation H      Tucks        Vaseline lotion applied to tissue with wiping    Herpes:     Throat:  Acyclovir      Oragel  Famvir  Valtrex     Vaccines:         Flu Shot Leg Cramps:       *Gardasil  Benadryl      Hepatitis A         Hepatitis B Nasal Spray:       Pneumovax  Saline Nasal Spray     Polio Booster         Tetanus Nausea:       Tuberculosis test or PPD  Vitamin B6 25 mg TID   AVOID:    Dramamine      *Gardasil  Emetrol       Live Poliovirus  Ginger Root 250 mg QID    MMR (measles, mumps &  High Complex Carbs @ Bedtime    rebella)  Sea Bands-Accupressure    Varicella (Chickenpox)  Unisom 1/2 tab TID     *No known complications           If received before Pain:         Known pregnancy;   Darvocet       Resume series after  Lortab        Delivery  Percocet    Yeast:   Tramadol      Femstat  Tylenol 3      Gyne-lotrimin  Ultram       Monistat  Vicodin           MISC:         All  Sunscreens           Hair Coloring/highlights          Insect Repellant's          (Including DEET)         Mystic Tans Third Trimester of Pregnancy The third trimester is from week 29 through week 42, months 7 through 9. This trimester is when your unborn baby (fetus) is growing very fast. At the end of the ninth month, the unborn  baby is about 20 inches in length. It weighs about 6-10 pounds. Follow these instructions at home:  Avoid all smoking, herbs, and alcohol. Avoid drugs not approved by your doctor.  Do not use any tobacco products, including cigarettes, chewing tobacco, and electronic cigarettes. If you need help quitting, ask your doctor. You may get counseling or other support to help you quit.  Only take medicine as told by your doctor. Some medicines are safe and some are not during pregnancy.  Exercise only as told by your doctor. Stop exercising if you start having cramps.  Eat regular, healthy meals.  Wear a good support bra if your breasts are tender.  Do not use hot tubs, steam rooms, or saunas.  Wear your seat belt when driving.  Avoid raw meat, uncooked cheese, and liter boxes and soil used by cats.  Take your prenatal vitamins.  Take 1500-2000 milligrams of calcium daily starting at the 20th week of pregnancy until you deliver your baby.  Try taking medicine that helps you poop (stool softener) as needed, and if your doctor approves. Eat more fiber by eating fresh fruit, vegetables, and whole grains. Drink enough fluids to keep your pee (urine) clear or pale yellow.  Take warm water baths (sitz baths) to soothe pain or discomfort caused by hemorrhoids. Use hemorrhoid cream if your doctor approves.  If you have puffy, bulging veins (varicose veins), wear support hose. Raise (elevate) your feet for 15 minutes, 3-4 times a day. Limit salt in your diet.  Avoid heavy lifting, wear low heels, and sit up straight.  Rest with your legs raised if you have leg  cramps or low back pain.  Visit your dentist if you have not gone during your pregnancy. Use a soft toothbrush to brush your teeth. Be gentle when you floss.  You can have sex (intercourse) unless your doctor tells you not to.  Do not travel far distances unless you must. Only do so with your doctor's approval.  Take prenatal classes.  Practice driving to the hospital.  Pack your hospital bag.  Prepare the baby's room.  Go to your doctor visits. Get help if:  You are not sure if you are in labor or if your water has broken.  You are dizzy.  You have mild cramps or pressure in your lower belly (abdominal).  You have a nagging pain in your belly area.  You continue to feel sick to your stomach (nauseous), throw up (vomit), or have watery poop (diarrhea).  You have bad smelling fluid coming from your vagina.  You have pain with peeing (urination). Get help right away if:  You have a fever.  You are leaking fluid from your vagina.  You are spotting or bleeding from your vagina.  You have severe belly cramping or pain.  You lose or gain weight rapidly.  You have trouble catching your breath and have chest pain.  You notice sudden or extreme puffiness (swelling) of your face, hands, ankles, feet, or legs.  You have not felt the baby move in over an hour.  You have severe headaches that do not go away with medicine.  You have vision changes. This information is not intended to replace advice given to you by your health care provider. Make sure you discuss any questions you have with your health care provider. Document Released: 09/11/2009 Document Revised: 11/23/2015 Document Reviewed: 08/18/2012 Elsevier Interactive Patient Education  2017 [employer].

## 2018-02-24 NOTE — Progress Notes (Signed)
Pt is here for an [name] visit. 

## 2018-02-24 NOTE — Progress Notes (Signed)
[name]- Doing well. Received TDAP; Blood transfusion consent signed. Plans for natural childbirth, labor support boyfriend and mom. Doula information given. Anticipatory guidance given regarding course of prenatal care. Desires Nexplanon as postpartum contraception. Reviewed red flag symptoms and when to call. RTC x 2 weeks for [name] or sooner if needed.

## 2018-02-25 LAB — CBC
HEMATOCRIT: 27.6 % — AB (ref 34.0–46.6)
Hemoglobin: 9.2 g/dL — ABNORMAL LOW (ref 11.1–15.9)
MCH: 26.4 pg — AB (ref 26.6–33.0)
MCHC: 33.3 g/dL (ref 31.5–35.7)
MCV: 79 fL (ref 79–97)
Platelets: 222 10*3/uL (ref 150–450)
RBC: 3.49 x10E6/uL — AB (ref 3.77–5.28)
RDW: 16.5 % — ABNORMAL HIGH (ref 12.3–15.4)
WBC: 7.1 10*3/uL (ref 3.4–10.8)

## 2018-02-25 LAB — GLUCOSE, 1 HOUR GESTATIONAL: Gestational Diabetes Screen: 70 mg/dL (ref 65–139)

## 2018-02-25 LAB — RPR: RPR Ser Ql: NONREACTIVE

## 2018-02-26 ENCOUNTER — Encounter: Payer: Self-pay | Admitting: Certified Nurse Midwife

## 2018-02-26 DIAGNOSIS — O99019 Anemia complicating pregnancy, unspecified trimester: Secondary | ICD-10-CM | POA: Insufficient documentation

## 2018-03-11 ENCOUNTER — Ambulatory Visit (INDEPENDENT_AMBULATORY_CARE_PROVIDER_SITE_OTHER): Payer: Medicaid Other | Admitting: Certified Nurse Midwife

## 2018-03-11 ENCOUNTER — Encounter: Payer: Self-pay | Admitting: Certified Nurse Midwife

## 2018-03-11 VITALS — BP 105/67 | HR 90 | Wt 208.1 lb

## 2018-03-11 DIAGNOSIS — Z23 Encounter for immunization: Secondary | ICD-10-CM

## 2018-03-11 DIAGNOSIS — Z3493 Encounter for supervision of normal pregnancy, unspecified, third trimester: Secondary | ICD-10-CM

## 2018-03-11 LAB — POCT URINALYSIS DIPSTICK OB
BILIRUBIN UA: NEGATIVE
Blood, UA: NEGATIVE
GLUCOSE, UA: NEGATIVE
KETONES UA: NEGATIVE
Leukocytes, UA: NEGATIVE
Nitrite, UA: NEGATIVE
SPEC GRAV UA: 1.01 (ref 1.010–1.025)
Urobilinogen, UA: 0.2 E.U./dL
pH, UA: 8 (ref 5.0–8.0)

## 2018-03-11 NOTE — Progress Notes (Signed)
Body mass index is 29.86 kg/m. [name], doing well. Feels good movement. Denies contractions. Pt state she has feels some lumps down below. Reviewed varicosities in pregnancy. Flu shot today. Follow up 2 wks.   Doreene Burke, CNM

## 2018-03-11 NOTE — Patient Instructions (Signed)

## 2018-03-24 ENCOUNTER — Encounter: Payer: Medicaid Other | Admitting: Obstetrics and Gynecology

## 2018-03-25 ENCOUNTER — Ambulatory Visit (INDEPENDENT_AMBULATORY_CARE_PROVIDER_SITE_OTHER): Payer: Medicaid Other | Admitting: Certified Nurse Midwife

## 2018-03-25 VITALS — BP 110/65 | HR 79 | Wt 211.6 lb

## 2018-03-25 DIAGNOSIS — N898 Other specified noninflammatory disorders of vagina: Secondary | ICD-10-CM

## 2018-03-25 DIAGNOSIS — R319 Hematuria, unspecified: Secondary | ICD-10-CM

## 2018-03-25 DIAGNOSIS — Z3493 Encounter for supervision of normal pregnancy, unspecified, third trimester: Secondary | ICD-10-CM

## 2018-03-25 DIAGNOSIS — O9989 Other specified diseases and conditions complicating pregnancy, childbirth and the puerperium: Secondary | ICD-10-CM

## 2018-03-25 DIAGNOSIS — O26893 Other specified pregnancy related conditions, third trimester: Secondary | ICD-10-CM

## 2018-03-25 LAB — POCT URINALYSIS DIPSTICK
Bilirubin, UA: NEGATIVE
Glucose, UA: NEGATIVE
Ketones, UA: NEGATIVE
Leukocytes, UA: NEGATIVE
Nitrite, UA: NEGATIVE
PH UA: 6.5 (ref 5.0–8.0)
PROTEIN UA: NEGATIVE
Spec Grav, UA: 1.02 (ref 1.010–1.025)
Urobilinogen, UA: 0.2 E.U./dL

## 2018-03-25 NOTE — Patient Instructions (Signed)

## 2018-03-25 NOTE — Progress Notes (Signed)
[name] doing well. Feels good movement. She complains of some brown/red with wiping and continues to feel swollen. She has not used the belly wand with the vaginal support as suggested. Exam today normal genitala with no obvious swelling or varicosities present. She denies recent intercourse or increased activity over the last few days. Swab today to r/o yeast or BV and urine culture due to blood present in urine. Will follow up with results. [name] in 2 wks.   Doreene Burke, CNM

## 2018-03-27 LAB — URINE CULTURE

## 2018-03-28 LAB — NUSWAB BV AND CANDIDA, NAA
CANDIDA ALBICANS, NAA: POSITIVE — AB
CANDIDA GLABRATA, NAA: NEGATIVE

## 2018-03-29 ENCOUNTER — Other Ambulatory Visit: Payer: Self-pay | Admitting: Certified Nurse Midwife

## 2018-03-29 MED ORDER — FLUCONAZOLE 150 MG PO TABS: 150.0000 mg | ORAL_TABLET | Freq: Once | ORAL | 0 refills | Status: AC

## 2018-03-29 NOTE — Progress Notes (Signed)
Swab positive for yeast, order sent to pharmacy on file. Pt notified via my chart.   Doreene Burke, CNM

## 2018-04-08 ENCOUNTER — Ambulatory Visit (INDEPENDENT_AMBULATORY_CARE_PROVIDER_SITE_OTHER): Payer: Medicaid Other | Admitting: Obstetrics and Gynecology

## 2018-04-08 VITALS — BP 106/60 | HR 88 | Wt 214.1 lb

## 2018-04-08 DIAGNOSIS — Z3493 Encounter for supervision of normal pregnancy, unspecified, third trimester: Secondary | ICD-10-CM

## 2018-04-08 LAB — POCT URINALYSIS DIPSTICK OB
BILIRUBIN UA: NEGATIVE
GLUCOSE, UA: NEGATIVE
Ketones, UA: NEGATIVE
Leukocytes, UA: NEGATIVE
Nitrite, UA: NEGATIVE
PH UA: 7 (ref 5.0–8.0)
POC,PROTEIN,UA: NEGATIVE
RBC UA: NEGATIVE
Spec Grav, UA: 1.015 (ref 1.010–1.025)
UROBILINOGEN UA: 0.2 U/dL

## 2018-04-08 NOTE — Progress Notes (Signed)
[name]- doing well,  Reports continued scant pink mucus when wiping, denies vaginal pain or recent sexual intercourse Microscopic wet-mount exam shows negative for pathogens, normal epithelial cells. No blood noted on exam or glove-reassured of normal findings today. Will let us know if mucus changes to bright red or yeast like.  Cultures next visit.

## 2018-04-08 NOTE — Progress Notes (Signed)
[name]- pt is doing well 

## 2018-04-13 ENCOUNTER — Telehealth: Payer: Self-pay | Admitting: Certified Nurse Midwife

## 2018-04-13 ENCOUNTER — Encounter: Payer: Self-pay | Admitting: Certified Nurse Midwife

## 2018-04-13 ENCOUNTER — Ambulatory Visit (INDEPENDENT_AMBULATORY_CARE_PROVIDER_SITE_OTHER): Payer: Medicaid Other | Admitting: Certified Nurse Midwife

## 2018-04-13 VITALS — BP 113/75 | HR 98 | Temp 98.2°F | Wt 214.1 lb

## 2018-04-13 DIAGNOSIS — Z3493 Encounter for supervision of normal pregnancy, unspecified, third trimester: Secondary | ICD-10-CM | POA: Diagnosis not present

## 2018-04-13 LAB — POCT URINALYSIS DIPSTICK OB
Bilirubin, UA: NEGATIVE
Glucose, UA: NEGATIVE
KETONES UA: NEGATIVE
Leukocytes, UA: NEGATIVE
NITRITE UA: NEGATIVE
PH UA: 6 (ref 5.0–8.0)
SPEC GRAV UA: 1.025 (ref 1.010–1.025)
UROBILINOGEN UA: 0.2 U/dL

## 2018-04-13 NOTE — Patient Instructions (Signed)
Group B Streptococcus Infection During Pregnancy Group B Streptococcus (GBS) is a type of bacteria (Streptococcus agalactiae) that is often found in healthy people, commonly in the rectum, vagina, and intestines. In people who are healthy and not pregnant, the bacteria rarely cause serious illness or complications. However, women who test positive for GBS during pregnancy can pass the bacteria to their baby during childbirth, which can cause serious infection in the baby after birth. Women with GBS may also have infections during their pregnancy or immediately after childbirth, such as such as urinary tract infections (UTIs) or infections of the uterus (uterine infections). Having GBS also increases a woman's risk of complications during pregnancy, such as early (preterm) labor or delivery, miscarriage, or stillbirth. Routine testing (screening) for GBS is recommended for all pregnant women. What increases the risk? You may have a higher risk for GBS infection during pregnancy if you had one during a past pregnancy. What are the signs or symptoms? In most cases, GBS infection does not cause symptoms in pregnant women. Signs and symptoms of a possible GBS-related infection may include:  Labor starting before the 37th week of pregnancy.  A UTI or bladder infection, which may cause: ? Fever. ? Pain or burning during urination. ? Frequent urination.  Fever during labor, along with: ? Bad-smelling discharge. ? Uterine tenderness. ? Rapid heartbeat in the mother, baby, or both.  Rare but serious symptoms of a possible GBS-related infection in women include:  Blood infection (septicemia). This may cause fever, chills, or confusion.  Lung infection (pneumonia). This may cause fever, chills, cough, rapid breathing, difficulty breathing, or chest pain.  Bone, joint, skin, or soft tissue infection.  How is this diagnosed? You may be screened for GBS between week 35 and week 37 of your pregnancy. If  you have symptoms of preterm labor, you may be screened earlier. This condition is diagnosed based on lab test results from:  A swab of fluid from the vagina and rectum.  A urine sample.  How is this treated? This condition is treated with antibiotic medicine. When you go into labor, or as soon as your water breaks (your membranes rupture), you will be given antibiotics through an IV tube. Antibiotics will continue until after you give birth. If you are having a cesarean delivery, you do not need antibiotics unless your membranes have already ruptured. Follow these instructions at home:  Take over-the-counter and prescription medicines only as told by your health care provider.  Take your antibiotic medicine as told by your health care provider. Do not stop taking the antibiotic even if you start to feel better.  Keep all pre-birth (prenatal) visits and follow-up visits as told by your health care provider. This is important. Contact a health care provider if:  You have pain or burning when you urinate.  You have to urinate frequently.  You have a fever or chills.  You develop a bad-smelling vaginal discharge. Get help right away if:  Your membranes rupture.  You go into labor.  You have severe pain in your abdomen.  You have difficulty breathing.  You have chest pain. This information is not intended to replace advice given to you by your health care provider. Make sure you discuss any questions you have with your health care provider. Document Released: 09/24/2007 Document Revised: 01/12/2016 Document Reviewed: 01/11/2016 Elsevier Interactive Patient Education  2018 [employer]  

## 2018-04-13 NOTE — Progress Notes (Signed)
Teresa Ortiz, pt had syncopal episode this morning. States she was not feeling well when she woke up today. This morning at work she got light headed and dizzy. She denies passing out. She state that she had breakfast and has been hydrating well. She had been sitting for a little bit before standing. She complains of a mild headache now. She denies visual changes, epigastric pain. reflexes 1+ bilaterally, no clonus present. She feels good movement. Reassurance given. Encouraged pt to stay hydrated, eat healthy snacks to help maintain steady blood sugar, and to not stand or sit for long period so time. Also encouraged her to stand up slowly. She verbalizes and agrees to plan. She will return in 1 wk for GBS testing.   Doreene Burke, CNM

## 2018-04-13 NOTE — Telephone Encounter (Signed)
Error

## 2018-04-22 ENCOUNTER — Encounter: Payer: Self-pay | Admitting: Certified Nurse Midwife

## 2018-04-22 ENCOUNTER — Ambulatory Visit (INDEPENDENT_AMBULATORY_CARE_PROVIDER_SITE_OTHER): Payer: Medicaid Other | Admitting: Certified Nurse Midwife

## 2018-04-22 VITALS — BP 109/61 | HR 77 | Wt 216.4 lb

## 2018-04-22 DIAGNOSIS — Z3483 Encounter for supervision of other normal pregnancy, third trimester: Secondary | ICD-10-CM | POA: Diagnosis not present

## 2018-04-22 LAB — POCT URINALYSIS DIPSTICK OB
Bilirubin, UA: NEGATIVE
Blood, UA: NEGATIVE
Glucose, UA: NEGATIVE
Ketones, UA: NEGATIVE
Leukocytes, UA: NEGATIVE
Nitrite, UA: NEGATIVE
POC,PROTEIN,UA: NEGATIVE
Spec Grav, UA: 1.01 (ref 1.010–1.025)
Urobilinogen, UA: 0.2 E.U./dL
pH, UA: 7 (ref 5.0–8.0)

## 2018-04-22 NOTE — Patient Instructions (Signed)
Group B Streptococcus Infection During Pregnancy Group B Streptococcus (GBS) is a type of bacteria (Streptococcus agalactiae) that is often found in healthy people, commonly in the rectum, vagina, and intestines. In people who are healthy and not pregnant, the bacteria rarely cause serious illness or complications. However, women who test positive for GBS during pregnancy can pass the bacteria to their baby during childbirth, which can cause serious infection in the baby after birth. Women with GBS may also have infections during their pregnancy or immediately after childbirth, such as such as urinary tract infections (UTIs) or infections of the uterus (uterine infections). Having GBS also increases a woman's risk of complications during pregnancy, such as early (preterm) labor or delivery, miscarriage, or stillbirth. Routine testing (screening) for GBS is recommended for all pregnant women. What increases the risk? You may have a higher risk for GBS infection during pregnancy if you had one during a past pregnancy. What are the signs or symptoms? In most cases, GBS infection does not cause symptoms in pregnant women. Signs and symptoms of a possible GBS-related infection may include:  Labor starting before the 37th week of pregnancy.  A UTI or bladder infection, which may cause: ? Fever. ? Pain or burning during urination. ? Frequent urination.  Fever during labor, along with: ? Bad-smelling discharge. ? Uterine tenderness. ? Rapid heartbeat in the mother, baby, or both.  Rare but serious symptoms of a possible GBS-related infection in women include:  Blood infection (septicemia). This may cause fever, chills, or confusion.  Lung infection (pneumonia). This may cause fever, chills, cough, rapid breathing, difficulty breathing, or chest pain.  Bone, joint, skin, or soft tissue infection.  How is this diagnosed? You may be screened for GBS between week 35 and week 37 of your pregnancy. If  you have symptoms of preterm labor, you may be screened earlier. This condition is diagnosed based on lab test results from:  A swab of fluid from the vagina and rectum.  A urine sample.  How is this treated? This condition is treated with antibiotic medicine. When you go into labor, or as soon as your water breaks (your membranes rupture), you will be given antibiotics through an IV tube. Antibiotics will continue until after you give birth. If you are having a cesarean delivery, you do not need antibiotics unless your membranes have already ruptured. Follow these instructions at home:  Take over-the-counter and prescription medicines only as told by your health care provider.  Take your antibiotic medicine as told by your health care provider. Do not stop taking the antibiotic even if you start to feel better.  Keep all pre-birth (prenatal) visits and follow-up visits as told by your health care provider. This is important. Contact a health care provider if:  You have pain or burning when you urinate.  You have to urinate frequently.  You have a fever or chills.  You develop a bad-smelling vaginal discharge. Get help right away if:  Your membranes rupture.  You go into labor.  You have severe pain in your abdomen.  You have difficulty breathing.  You have chest pain. This information is not intended to replace advice given to you by your health care provider. Make sure you discuss any questions you have with your health care provider. Document Released: 09/24/2007 Document Revised: 01/12/2016 Document Reviewed: 01/11/2016 Elsevier Interactive Patient Education  2018 [employer]  

## 2018-04-22 NOTE — Progress Notes (Signed)
[name], doing well. States she is feel better and has had no episodes of dizziness this week. Feels good fetal movement. GBS & cultures today. Will follow up with results. [name] in 1 wk.   Doreene Burke, CNM

## 2018-04-30 ENCOUNTER — Ambulatory Visit (INDEPENDENT_AMBULATORY_CARE_PROVIDER_SITE_OTHER): Payer: Medicaid Other | Admitting: Obstetrics and Gynecology

## 2018-04-30 VITALS — BP 117/72 | HR 96 | Wt 216.2 lb

## 2018-04-30 DIAGNOSIS — Z3493 Encounter for supervision of normal pregnancy, unspecified, third trimester: Secondary | ICD-10-CM | POA: Diagnosis not present

## 2018-04-30 LAB — POCT URINALYSIS DIPSTICK OB
Bilirubin, UA: NEGATIVE
Glucose, UA: NEGATIVE
Ketones, UA: NEGATIVE
LEUKOCYTES UA: NEGATIVE
Nitrite, UA: NEGATIVE
PROTEIN: NEGATIVE
RBC UA: NEGATIVE
Spec Grav, UA: 1.01 (ref 1.010–1.025)
UROBILINOGEN UA: 0.2 U/dL
pH, UA: 7 (ref 5.0–8.0)

## 2018-04-30 NOTE — Progress Notes (Signed)
[name]- pt is having some pelvic pressure 

## 2018-04-30 NOTE — Addendum Note (Signed)
Addended by: Brooke Dare on: 04/30/2018 09:16 AM   Modules accepted: Orders

## 2018-04-30 NOTE — Progress Notes (Signed)
[name]- CBC repeated. Feels like baby is sitting lower. Labor precautions discussed.

## 2018-05-01 ENCOUNTER — Other Ambulatory Visit: Payer: Self-pay | Admitting: Obstetrics and Gynecology

## 2018-05-01 ENCOUNTER — Ambulatory Visit: Payer: Medicaid Other | Admitting: Obstetrics and Gynecology

## 2018-05-01 DIAGNOSIS — D519 Vitamin B12 deficiency anemia, unspecified: Secondary | ICD-10-CM | POA: Insufficient documentation

## 2018-05-01 LAB — CBC
Hematocrit: 28.7 % — ABNORMAL LOW (ref 34.0–46.6)
Hemoglobin: 9.3 g/dL — ABNORMAL LOW (ref 11.1–15.9)
MCH: 25.2 pg — AB (ref 26.6–33.0)
MCHC: 32.4 g/dL (ref 31.5–35.7)
MCV: 78 fL — AB (ref 79–97)
Platelets: 238 10*3/uL (ref 150–450)
RBC: 3.69 x10E6/uL — AB (ref 3.77–5.28)
RDW: 16.3 % — ABNORMAL HIGH (ref 12.3–15.4)
WBC: 7.5 10*3/uL (ref 3.4–10.8)

## 2018-05-01 LAB — FERRITIN: Ferritin: 5 ng/mL — ABNORMAL LOW (ref 15–150)

## 2018-05-01 LAB — B12 AND FOLATE PANEL
Folate: 6.7 ng/mL (ref >3.0)
Vitamin B-12: 221 pg/mL — ABNORMAL LOW (ref 232–1245)

## 2018-05-01 MED ORDER — CYANOCOBALAMIN 1000 MCG/ML IJ SOLN: 1000.0000 ug | INTRAMUSCULAR | 1 refills | Status: DC

## 2018-05-02 LAB — STREP GP B NAA: STREP GROUP B AG: NEGATIVE

## 2018-05-02 LAB — GC/CHLAMYDIA PROBE AMP
Chlamydia trachomatis, NAA: NEGATIVE
Neisseria gonorrhoeae by PCR: NEGATIVE

## 2018-05-06 ENCOUNTER — Ambulatory Visit (INDEPENDENT_AMBULATORY_CARE_PROVIDER_SITE_OTHER): Payer: Medicaid Other | Admitting: Obstetrics and Gynecology

## 2018-05-06 VITALS — BP 110/70 | HR 88 | Wt 217.2 lb

## 2018-05-06 DIAGNOSIS — Z3493 Encounter for supervision of normal pregnancy, unspecified, third trimester: Secondary | ICD-10-CM

## 2018-05-06 LAB — POCT URINALYSIS DIPSTICK OB
Bilirubin, UA: NEGATIVE
Blood, UA: NEGATIVE
GLUCOSE, UA: NEGATIVE
Ketones, UA: NEGATIVE
Leukocytes, UA: NEGATIVE
NITRITE UA: NEGATIVE
POC,PROTEIN,UA: NEGATIVE
Spec Grav, UA: 1.015 (ref 1.010–1.025)
Urobilinogen, UA: 0.2 E.U./dL
pH, UA: 6 (ref 5.0–8.0)

## 2018-05-06 LAB — STREP GP B NAA

## 2018-05-06 NOTE — Progress Notes (Signed)
[name]- doing well, reassured of findings and labor precaution discussed.

## 2018-05-06 NOTE — Progress Notes (Signed)
[name]- pt is having pelvic pressure 

## 2018-05-12 ENCOUNTER — Other Ambulatory Visit: Payer: Self-pay

## 2018-05-12 ENCOUNTER — Ambulatory Visit (INDEPENDENT_AMBULATORY_CARE_PROVIDER_SITE_OTHER): Payer: Medicaid Other | Admitting: Certified Nurse Midwife

## 2018-05-12 ENCOUNTER — Emergency Department
Admission: EM | Admit: 2018-05-12 | Discharge: 2018-05-12 | Disposition: A | Discharge status: Home / Self Care | Payer: Medicaid Other | Attending: Emergency Medicine | Admitting: Emergency Medicine

## 2018-05-12 VITALS — BP 127/75 | HR 97 | Wt 217.4 lb

## 2018-05-12 DIAGNOSIS — J45909 Unspecified asthma, uncomplicated: Secondary | ICD-10-CM | POA: Diagnosis not present

## 2018-05-12 DIAGNOSIS — Z79899 Other long term (current) drug therapy: Secondary | ICD-10-CM | POA: Diagnosis not present

## 2018-05-12 DIAGNOSIS — K0889 Other specified disorders of teeth and supporting structures: Secondary | ICD-10-CM | POA: Diagnosis not present

## 2018-05-12 DIAGNOSIS — K029 Dental caries, unspecified: Secondary | ICD-10-CM | POA: Insufficient documentation

## 2018-05-12 DIAGNOSIS — K047 Periapical abscess without sinus: Secondary | ICD-10-CM

## 2018-05-12 DIAGNOSIS — Z3483 Encounter for supervision of other normal pregnancy, third trimester: Secondary | ICD-10-CM

## 2018-05-12 LAB — POCT URINALYSIS DIPSTICK OB
Bilirubin, UA: NEGATIVE
Blood, UA: NEGATIVE
Glucose, UA: NEGATIVE
KETONES UA: NEGATIVE
Leukocytes, UA: NEGATIVE
NITRITE UA: NEGATIVE
PROTEIN: NEGATIVE
Spec Grav, UA: 1.01 (ref 1.010–1.025)
UROBILINOGEN UA: 0.2 U/dL
pH, UA: 7.5 (ref 5.0–8.0)

## 2018-05-12 MED ORDER — FLUCONAZOLE 150 MG PO TABS: 150.0000 mg | ORAL_TABLET | Freq: Once | ORAL | 0 refills | Status: AC

## 2018-05-12 MED ORDER — PENICILLIN V POTASSIUM 500 MG PO TABS: 500.0000 mg | ORAL_TABLET | Freq: Four times a day (QID) | ORAL | 0 refills | Status: DC

## 2018-05-12 NOTE — ED Triage Notes (Addendum)
Pt In with co left jaw pain states she knows she has bad tooth to left lower area. Pt is [redacted] weeks pregnant without any pregnancy complaints.

## 2018-05-12 NOTE — ED Notes (Signed)
FHT 142 bpm 

## 2018-05-12 NOTE — Patient Instructions (Signed)
Braxton Hicks Contractions °Contractions of the uterus can occur throughout pregnancy, but they are not always a sign that you are in labor. You may have practice contractions called Braxton Hicks contractions. These false labor contractions are sometimes confused with true labor. °What are Braxton Hicks contractions? °Braxton Hicks contractions are tightening movements that occur in the muscles of the uterus before labor. Unlike true labor contractions, these contractions do not result in opening (dilation) and thinning of the cervix. Toward the end of pregnancy (32-34 weeks), Braxton Hicks contractions can happen more often and may become stronger. These contractions are sometimes difficult to tell apart from true labor because they can be very uncomfortable. You should not feel embarrassed if you go to the hospital with false labor. °Sometimes, the only way to tell if you are in true labor is for your health care provider to look for changes in the cervix. The health care provider will do a physical exam and may monitor your contractions. If you are not in true labor, the exam should show that your cervix is not dilating and your water has not broken. °If there are other health problems associated with your pregnancy, it is completely safe for you to be sent home with false labor. You may continue to have Braxton Hicks contractions until you go into true labor. °How to tell the difference between true labor and false labor °True labor °· Contractions last 30-70 seconds. °· Contractions become very regular. °· Discomfort is usually felt in the top of the uterus, and it spreads to the lower abdomen and low back. °· Contractions do not go away with walking. °· Contractions usually become more intense and increase in frequency. °· The cervix dilates and gets thinner. °False labor °· Contractions are usually shorter and not as strong as true labor contractions. °· Contractions are usually irregular. °· Contractions  are often felt in the front of the lower abdomen and in the groin. °· Contractions may go away when you walk around or change positions while lying down. °· Contractions get weaker and are shorter-lasting as time goes on. °· The cervix usually does not dilate or become thin. °Follow these instructions at home: °· Take over-the-counter and prescription medicines only as told by your health care provider. °· Keep up with your usual exercises and follow other instructions from your health care provider. °· Eat and drink lightly if you think you are going into labor. °· If Braxton Hicks contractions are making you uncomfortable: °? Change your position from lying down or resting to walking, or change from walking to resting. °? Sit and rest in a tub of warm water. °? Drink enough fluid to keep your urine pale yellow. Dehydration may cause these contractions. °? Do slow and deep breathing several times an hour. °· Keep all follow-up prenatal visits as told by your health care provider. This is important. °Contact a health care provider if: °· You have a fever. °· You have continuous pain in your abdomen. °Get help right away if: °· Your contractions become stronger, more regular, and closer together. °· You have fluid leaking or gushing from your vagina. °· You pass blood-tinged mucus (bloody show). °· You have bleeding from your vagina. °· You have low back pain that you never had before. °· You feel your baby’s head pushing down and causing pelvic pressure. °· Your baby is not moving inside you as much as it used to. °Summary °· Contractions that occur before labor are called Braxton   Hicks contractions, false labor, or practice contractions. °· Braxton Hicks contractions are usually shorter, weaker, farther apart, and less regular than true labor contractions. True labor contractions usually become progressively stronger and regular and they become more frequent. °· Manage discomfort from Braxton Hicks contractions by  changing position, resting in a warm bath, drinking plenty of water, or practicing deep breathing. °This information is not intended to replace advice given to you by your health care provider. Make sure you discuss any questions you have with your health care provider. °Document Released: 10/31/2016 Document Revised: 10/31/2016 Document Reviewed: 10/31/2016 °Elsevier Interactive Patient Education © 2018 [employer] ° °

## 2018-05-12 NOTE — Addendum Note (Signed)
Addended by: Brooke Dare on: 05/12/2018 03:50 PM   Modules accepted: Orders

## 2018-05-12 NOTE — Progress Notes (Signed)
[name] doing well. Went to ED last night for tooth pain was placed on PCN. Diflucan ordered prn yeast infection. Discussed BPP at next visit. SVE 3/50/-2. Follow up 1 wk with Melody.   Doreene BurkeAnnie Frances Joynt, CNM

## 2018-05-12 NOTE — ED Provider Notes (Signed)
[facility] Emergency Department Provider Note  ____________________________________________  Time seen: Approximately 2:06 AM  I have reviewed the triage vital signs and the nursing notes.   HISTORY  Chief Complaint Dental Pain    HPI Teresa Ortiz is a 27 y.o. female with a history of asthma who is [redacted] weeks pregnant who complains of dental pain in the left jaw the past day.  Worsening, gradual onset, severe.  No acute dental injury.  Denies difficulty swallowing or breathing, no throat pain.  No aggravating or relieving factors.  Denies jaw grinding or TMJ area pain when waking up.  No recent illness.  No fever chills or sweats.   No vaginal bleeding leakage of fluid or contractions.  Normal fetal movements   Past Medical History:  Diagnosis Date  . Asthma    childhood  . Frequent headaches   . Migraine without aura      Patient Active Problem List   Diagnosis Date Noted  . B12 deficiency anemia 05/01/2018  . Anemia affecting pregnancy 02/26/2018  . Pregnancy 01/30/2018  . Migraine without aura 05/11/2014     Past Surgical History:  Procedure Laterality Date  . DILATION AND EVACUATION N/A 07/16/2017   Procedure: DILATATION AND EVACUATION;  Surgeon: Linzie Collin, MD;  Location: [facility] ORS;  Service: Gynecology;  Laterality: N/A;     Prior to Admission medications   Medication Sig Start Date End Date Taking? Authorizing Provider  cyanocobalamin (,VITAMIN B-12,) 1000 MCG/ML injection Inject 1 mL (1,000 mcg total) into the muscle every 30 (thirty) days. 05/01/18   Shambley, Melody N, CNM  ferrous sulfate 325 (65 FE) MG tablet Take 325 mg by mouth daily with breakfast.    [provider]  penicillin v potassium (VEETID) 500 MG tablet Take 1 tablet (500 mg total) by mouth 4 (four) times daily for 7 days. 05/12/18 05/19/18  Sharman Cheek, MD  Prenatal Multivit-Min-Fe-FA (PRENATAL VITAMINS PO) Take 1 tablet by mouth daily.     [provider]     Allergies Sulfa antibiotics   Family History  Problem Relation Age of Onset  . Mental illness Mother        bipolor/depressed  . Cervical cancer Mother   . Alcohol abuse Father     Social History Social History   Tobacco Use  . Smoking status: Never Smoker  . Smokeless tobacco: Never Used  Substance Use Topics  . Alcohol use: No    Alcohol/week: 0.0 standard drinks    Comment: rarely  . Drug use: No    Review of Systems  Constitutional:   No fever or chills.  ENT:   No sore throat. No rhinorrhea.  Jaw pain as above Cardiovascular:   No chest pain or syncope. Respiratory:   No dyspnea or cough. Gastrointestinal:   Negative for abdominal pain, vomiting and diarrhea.  Musculoskeletal:   Negative for focal pain or swelling All other systems reviewed and are negative except as documented above in ROS and HPI.  ____________________________________________   PHYSICAL EXAM:  VITAL SIGNS: ED Triage Vitals  Enc Vitals Group     BP 05/12/18 0009 (!) 118/59     Pulse Rate 05/12/18 0009 83     Resp 05/12/18 0009 20     Temp 05/12/18 0009 97.9 F (36.6 C)     Temp Source 05/12/18 0009 Oral     SpO2 05/12/18 0009 98 %     Weight 05/12/18 0010 216 lb (98 kg)  Height --      Head Circumference --      Peak Flow --      Pain Score 05/12/18 0010 9     Pain Loc --      Pain Edu? --      Excl. in GC? --     Vital signs reviewed, nursing assessments reviewed.   Constitutional:   Alert and oriented. Non-toxic appearance. Eyes:   Conjunctivae are normal. EOMI. PERRL. ENT      Head:   Normocephalic and atraumatic.      Nose:   No congestion/rhinnorhea.       Mouth/Throat:   MMM, no pharyngeal erythema. No peritonsillar mass.  Tenderness in left lower gums without significant swelling or abscess or purulent drainage.  Tenderness is in the area of a tooth that is decayed with exposed dentin.  Oropharynx clear, floor of mouth soft       Neck:   No meningismus. Full ROM. Hematological/Lymphatic/Immunilogical:   No cervical lymphadenopathy. Cardiovascular:   RRR. Symmetric bilateral radial and DP pulses.  No murmurs. Cap refill less than 2 seconds. Respiratory:   Normal respiratory effort without tachypnea/retractions. Breath sounds are clear and equal bilaterally. No wheezes/rales/rhonchi. Gastrointestinal:   Soft and nontender. Non distended. There is no CVA tenderness.  No rebound, rigidity, or guarding.  Musculoskeletal:   Normal range of motion in all extremities. No joint effusions.  No lower extremity tenderness.  No edema. Neurologic:   Normal speech and language.  Motor grossly intact. No acute focal neurologic deficits are appreciated.  Skin:    Skin is warm, dry and intact. No rash noted.  No petechiae, purpura, or bullae.  ____________________________________________    LABS (pertinent positives/negatives) (all labs ordered are listed, but only abnormal results are displayed) Labs Reviewed - No data to display ____________________________________________   EKG    ____________________________________________    RADIOLOGY  No results found.  ____________________________________________   PROCEDURES Procedures  ____________________________________________    CLINICAL IMPRESSION / ASSESSMENT AND PLAN / ED COURSE  Pertinent labs & imaging results that were available during my care of the patient were reviewed by me and considered in my medical decision making (see chart for details).    Patient presents with dental pain, infected Khary.  No evidence of abscess or secondary space infection or airway compromise.  Oral penicillin, follow-up with dentistry and obstetrics      ____________________________________________   FINAL CLINICAL IMPRESSION(S) / ED DIAGNOSES    Final diagnoses:  Pain, dental  Infected dental caries     ED Discharge Orders         Ordered    penicillin v  potassium (VEETID) 500 MG tablet  4 times daily     05/12/18 0206          Portions of this note were generated with dragon dictation software. Dictation errors may occur despite best attempts at proofreading.    Sharman Cheek, MD 05/12/18 [phone]

## 2018-05-12 NOTE — ED Notes (Signed)
Patient reports having left upper and lower jaw pain for several hours tonight. Reports pain radiates up into left ear.  Patient reports had a filling fall out of tooth on left lower but had not had any problems with it until tonight.

## 2018-05-13 ENCOUNTER — Encounter: Payer: Medicaid Other | Admitting: Certified Nurse Midwife

## 2018-05-15 ENCOUNTER — Inpatient Hospital Stay: Payer: Medicaid Other | Admitting: Anesthesiology

## 2018-05-15 ENCOUNTER — Other Ambulatory Visit: Payer: Self-pay

## 2018-05-15 ENCOUNTER — Inpatient Hospital Stay
Admission: EM | Admit: 2018-05-15 | Discharge: 2018-05-16 | DRG: 807 | Disposition: A | Discharge status: Home / Self Care | Payer: Medicaid Other | Attending: Certified Nurse Midwife | Admitting: Certified Nurse Midwife

## 2018-05-15 DIAGNOSIS — Z3A39 39 weeks gestation of pregnancy: Secondary | ICD-10-CM

## 2018-05-15 DIAGNOSIS — O9902 Anemia complicating childbirth: Secondary | ICD-10-CM | POA: Diagnosis present

## 2018-05-15 DIAGNOSIS — D519 Vitamin B12 deficiency anemia, unspecified: Secondary | ICD-10-CM | POA: Diagnosis present

## 2018-05-15 DIAGNOSIS — Z3483 Encounter for supervision of other normal pregnancy, third trimester: Secondary | ICD-10-CM | POA: Diagnosis present

## 2018-05-15 LAB — TYPE AND SCREEN
ABO/RH(D): O POS
ANTIBODY SCREEN: NEGATIVE

## 2018-05-15 LAB — CBC
HCT: 32.6 % — ABNORMAL LOW (ref 36.0–46.0)
Hemoglobin: 10.1 g/dL — ABNORMAL LOW (ref 12.0–15.0)
MCH: 24.2 pg — ABNORMAL LOW (ref 26.0–34.0)
MCHC: 31 g/dL (ref 30.0–36.0)
MCV: 78 fL — ABNORMAL LOW (ref 80.0–100.0)
Platelets: 208 10*3/uL (ref 150–400)
RBC: 4.18 MIL/uL (ref 3.87–5.11)
RDW: 16.7 % — ABNORMAL HIGH (ref 11.5–15.5)
WBC: 8.8 10*3/uL (ref 4.0–10.5)
nRBC: 0 % (ref 0.0–0.2)

## 2018-05-15 LAB — ROM PLUS (ARMC ONLY): ROM PLUS: POSITIVE

## 2018-05-15 MED ORDER — FENTANYL 2.5 MCG/ML W/ROPIVACAINE 0.15% IN NS 100 ML EPIDURAL (ARMC)
12.0000 mL/h | EPIDURAL | Status: DC
  Administered 2018-05-15: 12 mL/h via EPIDURAL

## 2018-05-15 MED ORDER — DIPHENHYDRAMINE HCL 50 MG/ML IJ SOLN: 12.5000 mg | INTRAMUSCULAR | Status: DC | PRN

## 2018-05-15 MED ORDER — OXYTOCIN 40 UNITS IN LACTATED RINGERS INFUSION - SIMPLE MED: 2.5000 [IU]/h | INTRAVENOUS | Status: DC

## 2018-05-15 MED ORDER — SODIUM CHLORIDE 0.9 % IV SOLN
INTRAVENOUS | Status: DC | PRN
  Administered 2018-05-15 (×2): 5 mL via EPIDURAL

## 2018-05-15 MED ORDER — PRENATAL MULTIVITAMIN CH
1.0000 | ORAL_TABLET | Freq: Every day | ORAL | Status: DC
  Administered 2018-05-15 – 2018-05-16 (×2): 1 via ORAL
  Filled 2018-05-15 (×2): qty 1

## 2018-05-15 MED ORDER — LACTATED RINGERS IV SOLN: 500.0000 mL | INTRAVENOUS | Status: DC | PRN

## 2018-05-15 MED ORDER — METHYLERGONOVINE MALEATE 0.2 MG/ML IJ SOLN: 0.2000 mg | INTRAMUSCULAR | Status: DC | PRN

## 2018-05-15 MED ORDER — ACETAMINOPHEN 500 MG PO TABS
1000.0000 mg | ORAL_TABLET | Freq: Once | ORAL | Status: AC
  Administered 2018-05-15: 1000 mg via ORAL

## 2018-05-15 MED ORDER — WITCH HAZEL-GLYCERIN EX PADS: 1.0000 "application " | MEDICATED_PAD | CUTANEOUS | Status: DC | PRN

## 2018-05-15 MED ORDER — FERROUS SULFATE 325 (65 FE) MG PO TABS
325.0000 mg | ORAL_TABLET | Freq: Every day | ORAL | Status: DC
  Administered 2018-05-15 – 2018-05-16 (×2): 325 mg via ORAL
  Filled 2018-05-15 (×2): qty 1

## 2018-05-15 MED ORDER — EPHEDRINE 5 MG/ML INJ
10.0000 mg | INTRAVENOUS | Status: DC | PRN
  Filled 2018-05-15: qty 2

## 2018-05-15 MED ORDER — PHENYLEPHRINE 40 MCG/ML (10ML) SYRINGE FOR IV PUSH (FOR BLOOD PRESSURE SUPPORT)
80.0000 ug | PREFILLED_SYRINGE | INTRAVENOUS | Status: DC | PRN
  Filled 2018-05-15: qty 5

## 2018-05-15 MED ORDER — OXYTOCIN 10 UNIT/ML IJ SOLN
INTRAMUSCULAR | Status: AC
  Filled 2018-05-15: qty 2

## 2018-05-15 MED ORDER — LACTATED RINGERS IV SOLN
INTRAVENOUS | Status: DC
  Administered 2018-05-15: 02:00:00 via INTRAVENOUS

## 2018-05-15 MED ORDER — LIDOCAINE HCL (PF) 1 % IJ SOLN
INTRAMUSCULAR | Status: DC | PRN
  Administered 2018-05-15: 1 mL via INTRADERMAL

## 2018-05-15 MED ORDER — DIBUCAINE 1 % RE OINT: 1.0000 "application " | TOPICAL_OINTMENT | RECTAL | Status: DC | PRN

## 2018-05-15 MED ORDER — ACETAMINOPHEN 500 MG PO TABS
ORAL_TABLET | ORAL | Status: AC
  Filled 2018-05-15: qty 2

## 2018-05-15 MED ORDER — OXYCODONE-ACETAMINOPHEN 5-325 MG PO TABS: 1.0000 | ORAL_TABLET | ORAL | Status: DC | PRN

## 2018-05-15 MED ORDER — TERBUTALINE SULFATE 1 MG/ML IJ SOLN: 0.2500 mg | Freq: Once | INTRAMUSCULAR | Status: DC | PRN

## 2018-05-15 MED ORDER — LACTATED RINGERS IV SOLN: 500.0000 mL | Freq: Once | INTRAVENOUS | Status: DC

## 2018-05-15 MED ORDER — METHYLERGONOVINE MALEATE 0.2 MG PO TABS
0.2000 mg | ORAL_TABLET | ORAL | Status: DC | PRN
  Filled 2018-05-15: qty 1

## 2018-05-15 MED ORDER — COCONUT OIL OIL
1.0000 "application " | TOPICAL_OIL | Status: DC | PRN
  Administered 2018-05-15: 1 via TOPICAL
  Filled 2018-05-15: qty 120

## 2018-05-15 MED ORDER — SENNOSIDES-DOCUSATE SODIUM 8.6-50 MG PO TABS
2.0000 | ORAL_TABLET | ORAL | Status: DC
  Administered 2018-05-16: 2 via ORAL
  Filled 2018-05-15 (×2): qty 2

## 2018-05-15 MED ORDER — LIDOCAINE HCL (PF) 1 % IJ SOLN
INTRAMUSCULAR | Status: AC
  Filled 2018-05-15: qty 30

## 2018-05-15 MED ORDER — ONDANSETRON HCL 4 MG/2ML IJ SOLN: 4.0000 mg | Freq: Four times a day (QID) | INTRAMUSCULAR | Status: DC | PRN

## 2018-05-15 MED ORDER — ACETAMINOPHEN 325 MG PO TABS
650.0000 mg | ORAL_TABLET | ORAL | Status: DC | PRN
  Administered 2018-05-15 – 2018-05-16 (×2): 650 mg via ORAL
  Filled 2018-05-15 (×2): qty 2

## 2018-05-15 MED ORDER — OXYTOCIN 10 UNIT/ML IJ SOLN: 10.0000 [IU] | Freq: Once | INTRAMUSCULAR | Status: DC

## 2018-05-15 MED ORDER — ONDANSETRON HCL 4 MG PO TABS: 4.0000 mg | ORAL_TABLET | ORAL | Status: DC | PRN

## 2018-05-15 MED ORDER — OXYTOCIN BOLUS FROM INFUSION
500.0000 mL | Freq: Once | INTRAVENOUS | Status: AC
  Administered 2018-05-15: 500 mL via INTRAVENOUS

## 2018-05-15 MED ORDER — IBUPROFEN 600 MG PO TABS
600.0000 mg | ORAL_TABLET | Freq: Four times a day (QID) | ORAL | Status: DC
  Administered 2018-05-15 – 2018-05-16 (×6): 600 mg via ORAL
  Filled 2018-05-15 (×6): qty 1

## 2018-05-15 MED ORDER — OXYTOCIN 40 UNITS IN LACTATED RINGERS INFUSION - SIMPLE MED
1.0000 m[IU]/min | INTRAVENOUS | Status: DC
  Administered 2018-05-15: 2 m[IU]/min via INTRAVENOUS
  Filled 2018-05-15: qty 1000

## 2018-05-15 MED ORDER — ONDANSETRON HCL 4 MG/2ML IJ SOLN: 4.0000 mg | INTRAMUSCULAR | Status: DC | PRN

## 2018-05-15 MED ORDER — FENTANYL 2.5 MCG/ML W/ROPIVACAINE 0.15% IN NS 100 ML EPIDURAL (ARMC)
EPIDURAL | Status: AC
  Filled 2018-05-15: qty 100

## 2018-05-15 MED ORDER — OXYCODONE-ACETAMINOPHEN 5-325 MG PO TABS: 2.0000 | ORAL_TABLET | ORAL | Status: DC | PRN

## 2018-05-15 MED ORDER — AMMONIA AROMATIC IN INHA
RESPIRATORY_TRACT | Status: AC
  Filled 2018-05-15: qty 10

## 2018-05-15 MED ORDER — MISOPROSTOL 200 MCG PO TABS
ORAL_TABLET | ORAL | Status: AC
  Filled 2018-05-15: qty 4

## 2018-05-15 MED ORDER — SIMETHICONE 80 MG PO CHEW: 80.0000 mg | CHEWABLE_TABLET | ORAL | Status: DC | PRN

## 2018-05-15 MED ORDER — BUTORPHANOL TARTRATE 2 MG/ML IJ SOLN: 1.0000 mg | INTRAMUSCULAR | Status: DC | PRN

## 2018-05-15 MED ORDER — LIDOCAINE HCL (PF) 1 % IJ SOLN: 30.0000 mL | INTRAMUSCULAR | Status: DC | PRN

## 2018-05-15 MED ORDER — BENZOCAINE-MENTHOL 20-0.5 % EX AERO
1.0000 "application " | INHALATION_SPRAY | CUTANEOUS | Status: DC | PRN
  Administered 2018-05-15: 1 via TOPICAL
  Filled 2018-05-15: qty 56

## 2018-05-15 MED ORDER — SOD CITRATE-CITRIC ACID 500-334 MG/5ML PO SOLN: 30.0000 mL | ORAL | Status: DC | PRN

## 2018-05-15 MED ORDER — DOCUSATE SODIUM 100 MG PO CAPS
100.0000 mg | ORAL_CAPSULE | Freq: Two times a day (BID) | ORAL | Status: DC
  Administered 2018-05-15 – 2018-05-16 (×2): 100 mg via ORAL
  Filled 2018-05-15 (×2): qty 1

## 2018-05-15 MED ORDER — ACETAMINOPHEN 325 MG PO TABS: 650.0000 mg | ORAL_TABLET | ORAL | Status: DC | PRN

## 2018-05-15 MED ORDER — LIDOCAINE-EPINEPHRINE (PF) 1.5 %-1:200000 IJ SOLN
INTRAMUSCULAR | Status: DC | PRN
  Administered 2018-05-15: 3 mL via EPIDURAL

## 2018-05-15 NOTE — Lactation Note (Signed)
This note was copied from a baby's chart. Lactation Consultation Note  Patient Name: Teresa Ortiz Reason for consult: Initial assessment;Term;Other (Comment)(Kynleigh had (+) Coombs)  Assisted mom with postioning with pillow support in cradle hold on right breast.  Demonstrated hand expression.  Encouraged deeper latch.  Frankey PootKynleigh has strong rhythmic suck.  This is mom's third baby.  She has only breast fed once and gave up shortly after taking baby home from hospital.  Mom reports this baby is breast feeding well.  Mom voices some tenderness, but not severe pain with breast feeding.  Coconut oil given with instructions in use.  Reviewed supply and demand, routine newborn feeding patterns and normal course of lactation.  Lactation name and number written on white board and encouraged to call with any questions, concerns or assistance. Maternal Data Formula Feeding for Exclusion: No Has patient been taught Hand Expression?: Yes(Can hand express colostrum) Does the patient have breastfeeding experience prior to this delivery?: Yes  Feeding Feeding Type: Breast Fed  LATCH Score Latch: Repeated attempts needed to sustain latch, nipple held in mouth throughout feeding, stimulation needed to elicit sucking reflex.  Audible Swallowing: A few with stimulation  Type of Nipple: Everted at rest and after stimulation  Comfort (Breast/Nipple): Soft / non-tender  Hold (Positioning): Assistance needed to correctly position infant at breast and maintain latch.  LATCH Score: 7  Interventions Interventions: Breast feeding basics reviewed;Position options;Assisted with latch;Reverse pressure;Breast compression;Coconut oil  Lactation Tools Discussed/Used WIC Program: Yes   Consult Status Consult Status: Follow-up Follow-up type: Call as needed    Louis MeckelWilliams, Terre Hanneman Kay Ortiz, 7:32 PM

## 2018-05-15 NOTE — H&P (Signed)
History and Physical   HPI  Teresa Ortiz is a 27 y.o. [id number] at [redacted]w[redacted]d Estimated Date of Delivery: 05/19/18 who is being admitted for SROM, labor management.    OB History  OB History  Gravida Para Term Preterm AB Living  4 2 2  0 1 2  SAB TAB Ectopic Multiple Live Births  1 0 0 0 2    # Outcome Date GA Lbr Len/2nd Weight Sex Delivery Anes PTL Lv  4 Current           3 SAB 2019          2 Term 06/01/16 [redacted]w[redacted]d / 00:14 2750 g M Vag-Spont EPI  LIV     Name: BENTLEIGH, WAREN     Apgar1: 8  Apgar5: 9  1 Term 2011   2903 g F Vag-Spont   LIV    PROBLEM LIST  Pregnancy complications or risks: Patient Active Problem List   Diagnosis Date Noted  . Labor and delivery, indication for care 05/15/2018  . B12 deficiency anemia 05/01/2018  . Anemia affecting pregnancy 02/26/2018  . Pregnancy 01/30/2018  . Migraine without aura 05/11/2014    Prenatal labs and studies: ABO, Rh: --/--/O POS (11/15 4540) Antibody: NEG (11/15 0218) Rubella: <0.90 (04/29 1002) RPR: Non Reactive (08/27 0951)  HBsAg: Negative (04/29 1002)  HIV: Non Reactive (04/29 1002)  JWJ:XBJYNWGN (10/31 5621)   Past Medical History:  Diagnosis Date  . Asthma    childhood  . Frequent headaches   . Migraine without aura      Past Surgical History:  Procedure Laterality Date  . DILATION AND EVACUATION N/A 07/16/2017   Procedure: DILATATION AND EVACUATION;  Surgeon: Linzie Collin, MD;  Location: [facility] ORS;  Service: Gynecology;  Laterality: N/A;     Medications    Current Discharge Medication List    CONTINUE these medications which have NOT CHANGED   Details  ferrous sulfate 325 (65 FE) MG tablet Take 325 mg by mouth daily with breakfast.    Prenatal Multivit-Min-Fe-FA (PRENATAL VITAMINS PO) Take 1 tablet by mouth daily.         Allergies  Sulfa antibiotics  Review of Systems  Constitutional: negative Eyes: negative Ears, nose, mouth, throat, and face: negative Respiratory:  negative Cardiovascular: negative Gastrointestinal: negative Genitourinary:negative Integument/breast: negative Hematologic/lymphatic: negative Musculoskeletal:negative Neurological: negative Behavioral/Psych: negative Endocrine: negative Allergic/Immunologic: negative  Physical Exam  BP 101/61   Pulse 81   Temp 97.9 F (36.6 C) (Oral)   Resp 18   Ht 5\' 2"  (1.575 m)   Wt 98.6 kg   LMP 08/12/2017 (Exact Date)   SpO2 100%   BMI 39.77 kg/m   Lungs:  CTA B Cardio: RRR  Abd: Soft, gravid, NT Presentation: cephalic EXT: No C/C/ 1+ Edema DTRs: 2+ B CERVIX: 3cm on admision ROM plus positive  See Prenatal records for more detailed PE.     FHR:  Baseline: 130 bpm, Variability: Good {> 6 bpm), Accelerations: Reactive and Decelerations: Absent  Toco: Uterine Contractions: Frequency: Every 3-4 minutes, Duration: 50-80 seconds and Intensity: moderate   Test Results  Results for orders placed or performed during the hospital encounter of 05/15/18 (from the past 24 hour(s))  ROM Plus ([facility] only)     Status: None   Collection Time: 05/15/18  1:05 AM  Result Value Ref Range   Rom Plus POSITIVE   Type and screen [facility]     Status: None   Collection Time: 05/15/18  2:18 AM  Result Value Ref Range   ABO/RH(D) O POS    Antibody Screen NEG    Sample Expiration      05/18/2018 Performed at [facility], [address] [place], [place] [id number]   CBC     Status: Abnormal   Collection Time: 05/15/18  2:18 AM  Result Value Ref Range   WBC 8.8 4.0 - 10.5 K/uL   RBC 4.18 3.87 - 5.11 MIL/uL   Hemoglobin 10.1 (L) 12.0 - 15.0 g/dL   HCT 19.132.6 (L) 47.836.0 - 29.546.0 %   MCV 78.0 (L) 80.0 - 100.0 fL   MCH 24.2 (L) 26.0 - 34.0 pg   MCHC 31.0 30.0 - 36.0 g/dL   RDW 62.116.7 (H) 30.811.5 - 65.715.5 %   Platelets 208 150 - 400 K/uL   nRBC 0.0 0.0 - 0.2 %   Group B Strep negative  Assessment   G4P2012 at 3654w3d Estimated Date of Delivery: 05/19/18  The fetus  is reassuring.    Patient Active Problem List   Diagnosis Date Noted  . Labor and delivery, indication for care 05/15/2018  . B12 deficiency anemia 05/01/2018  . Anemia affecting pregnancy 02/26/2018  . Pregnancy 01/30/2018  . Migraine without aura 05/11/2014    Plan  1. Admit to L&D :   IV Pitocin augmentation 2. EFM:-- Category 1 3. Epidural if desired.  Stadol for IV pain until epidural requested. 4. Admission labs completed 5 Anticipate NSVD  Doreene Burkennie Terrick Allred, CNM  05/15/2018

## 2018-05-15 NOTE — Anesthesia Preprocedure Evaluation (Signed)
Anesthesia Evaluation  Patient identified by MRN, date of birth, ID band Patient awake    Reviewed: Allergy & Precautions, H&P , NPO status , Patient's Chart, lab work & pertinent test results  History of Anesthesia Complications Negative for: history of anesthetic complications  Airway Mallampati: III  TM Distance: >3 FB Neck ROM: full    Dental  (+) Chipped, Poor Dentition   Pulmonary neg shortness of breath, asthma ,           Cardiovascular Exercise Tolerance: Good (-) hypertensionnegative cardio ROS       Neuro/Psych  Headaches,    GI/Hepatic negative GI ROS,   Endo/Other    Renal/GU   negative genitourinary   Musculoskeletal   Abdominal   Peds  Hematology negative hematology ROS (+)   Anesthesia Other Findings Past Medical History: No date: Asthma     Comment:  childhood No date: Frequent headaches No date: Migraine without aura  Past Surgical History: 07/16/2017: DILATION AND EVACUATION; N/A     Comment:  Procedure: DILATATION AND EVACUATION;  Surgeon: Linzie CollinEvans,               David James, MD;  Location: [facility] ORS;  Service:               Gynecology;  Laterality: N/A;  BMI    Body Mass Index:  39.77 kg/m      Reproductive/Obstetrics (+) Pregnancy                             Anesthesia Physical Anesthesia Plan  ASA: III  Anesthesia Plan: Epidural   Post-op Pain Management:    Induction:   PONV Risk Score and Plan:   Airway Management Planned:   Additional Equipment:   Intra-op Plan:   Post-operative Plan:   Informed Consent: I have reviewed the patients History and Physical, chart, labs and discussed the procedure including the risks, benefits and alternatives for the proposed anesthesia with the patient or authorized representative who has indicated his/her understanding and acceptance.     Plan Discussed with: Anesthesiologist  Anesthesia Plan Comments:          Anesthesia Quick Evaluation

## 2018-05-15 NOTE — Anesthesia Procedure Notes (Signed)
Epidural Patient location during procedure: OB Start time: 05/15/2018 3:05 AM End time: 05/15/2018 3:11 AM  Staffing Anesthesiologist: Sarissa Dern, Cleda MccreedyJoseph K, MD Performed: anesthesiologist   Preanesthetic Checklist Completed: patient identified, site marked, surgical consent, pre-op evaluation, timeout performed, IV checked, risks and benefits discussed and monitors and equipment checked  Epidural Patient position: sitting Prep: ChloraPrep Patient monitoring: heart rate, continuous pulse ox and blood pressure Approach: midline Location: L3-L4 Injection technique: LOR saline  Needle:  Needle type: Tuohy  Needle gauge: 17 G Needle length: 9 cm and 9 Needle insertion depth: 6 cm Catheter type: closed end flexible Catheter size: 19 Gauge Catheter at skin depth: 11 cm Test dose: negative and 1.5% lidocaine with Epi 1:200 K  Assessment Sensory level: T10 Events: blood not aspirated, injection not painful, no injection resistance, negative IV test and no paresthesia  Additional Notes 1 attempt Pt. Evaluated and documentation done after procedure finished. Patient identified. Risks/Benefits/Options discussed with patient including but not limited to bleeding, infection, nerve damage, paralysis, failed block, incomplete pain control, headache, blood pressure changes, nausea, vomiting, reactions to medication both or allergic, itching and postpartum back pain. Confirmed with bedside nurse the patient's most recent platelet count. Confirmed with patient that they are not currently taking any anticoagulation, have any bleeding history or any family history of bleeding disorders. Patient expressed understanding and wished to proceed. All questions were answered. Sterile technique was used throughout the entire procedure. Please see nursing notes for vital signs. Test dose was given through epidural catheter and negative prior to continuing to dose epidural or start infusion. Warning signs of  high block given to the patient including shortness of breath, tingling/numbness in hands, complete motor block, or any concerning symptoms with instructions to call for help. Patient was given instructions on fall risk and not to get out of bed. All questions and concerns addressed with instructions to call with any issues or inadequate analgesia.   Patient tolerated the insertion well without immediate complications.Reason for block:procedure for pain

## 2018-05-16 LAB — CBC
HEMATOCRIT: 28.9 % — AB (ref 36.0–46.0)
HEMOGLOBIN: 8.7 g/dL — AB (ref 12.0–15.0)
MCH: 24 pg — ABNORMAL LOW (ref 26.0–34.0)
MCHC: 30.1 g/dL (ref 30.0–36.0)
MCV: 79.8 fL — ABNORMAL LOW (ref 80.0–100.0)
NRBC: 0 % (ref 0.0–0.2)
Platelets: 192 10*3/uL (ref 150–400)
RBC: 3.62 MIL/uL — ABNORMAL LOW (ref 3.87–5.11)
RDW: 16.8 % — ABNORMAL HIGH (ref 11.5–15.5)
WBC: 8.5 10*3/uL (ref 4.0–10.5)

## 2018-05-16 LAB — RPR: RPR Ser Ql: NONREACTIVE

## 2018-05-16 MED ORDER — IBUPROFEN 600 MG PO TABS: 600.0000 mg | ORAL_TABLET | Freq: Four times a day (QID) | ORAL | 0 refills | Status: DC

## 2018-05-16 MED ORDER — MEASLES, MUMPS & RUBELLA VAC IJ SOLR
0.5000 mL | Freq: Once | INTRAMUSCULAR | Status: AC
  Administered 2018-05-16: 0.5 mL via SUBCUTANEOUS
  Filled 2018-05-16: qty 0.5

## 2018-05-16 NOTE — Discharge Summary (Signed)
  Obstetric Discharge Summary  Patient ID: Mahlon GammonChelsea L Wuebker MRN: [id number] DOB/AGE: 1990/09/17 27 y.o.   Date of Admission: 05/15/2018  Date of Discharge: 05/16/18  Admitting Diagnosis: Onset of Labor at 6379w3d  Secondary Diagnosis: Rubella non-immune  Mode of Delivery: Normal spontaneous vaginal delivery     Discharge Diagnosis: No other diagnosis   Intrapartum Procedures: Epidural, Pitocin augmentation   Post partum procedures: None  Complications: None   Brief Hospital Course   Mahlon GammonChelsea L Lenhoff is a [id number] who had a SVD on 05/15/2018;  for further details of this birth, please refer to the delivey note.  Patient had an uncomplicated postpartum course.  By time of discharge on PPD#1, her pain was controlled on oral pain medications; she had appropriate lochia and was ambulating, voiding without difficulty and tolerating regular diet.  She was deemed stable for discharge to home.    Labs:  CBC Latest Ref Rng & Units 05/16/2018 05/15/2018 04/30/2018  WBC 4.0 - 10.5 K/uL 8.5 8.8 7.5  Hemoglobin 12.0 - 15.0 g/dL 1.4(N8.7(L) 10.1(L) 9.3(L)  Hematocrit 36.0 - 46.0 % 28.9(L) 32.6(L) 28.7(L)  Platelets 150 - 400 K/uL 192 208 238   O POS  Physical exam:   Temp:  [97.7 F (36.5 C)-98.1 F (36.7 C)] 97.7 F (36.5 C) (11/16 0907) Pulse Rate:  [65-87] 81 (11/16 0907) Resp:  [18] 18 (11/16 0907) BP: (100-114)/(53-73) 114/73 (11/16 0907) SpO2:  [99 %-100 %] 100 % (11/16 0907)  General: Alert and no distress  Lochia: Appropriate  Abdomen: Soft, NT  Uterine Fundus: Firm  Perineum: No significant drainage, no dehiscence, no significant erythema  Extremities: No evidence of DVT seen on physical exam. No lower extremity edema.  Discharge Instructions: Per After Visit Summary  Activity: Advance as tolerated. Pelvic rest for 6 weeks.  Also refer to After Visit Summary  Diet: Regular  Medications: Allergies as of 05/16/2018      Reactions   Sulfa Antibiotics Rash       Medication List    TAKE these medications   ferrous sulfate 325 (65 FE) MG tablet Take 325 mg by mouth daily with breakfast.   ibuprofen 600 MG tablet Commonly known as:  ADVIL,MOTRIN Take 1 tablet (600 mg total) by mouth every 6 (six) hours.   PRENATAL VITAMINS PO Take 1 tablet by mouth daily.      Outpatient follow up:  Follow-up Information    Doreene Burkehompson, Annie, CNM. Call in 6 week(s).   Specialties:  Certified Nurse Midwife, Radiology Why:  Please call to schduled a six (6) week postpartum visit and Nexplanon insertion Contact information: [address] [place] [id number] [phone]          Postpartum contraception: Nexplanon  Discharged Condition: good  Discharged to: home   Newborn Data:  Disposition:home with mother  Apgars: APGAR (1 MIN): 8   APGAR (5 MINS): 9    Baby Feeding: Breast   Gunnar BullaJenkins Michelle Eann Cleland, CNM [facility], [place] [facility] 05/16/18 12:03 PM

## 2018-05-16 NOTE — Progress Notes (Signed)
Reviewed D/C instructions with pt and family. Pt verbalized understanding of teaching. Discharged to home via W/C. Pt to schedule f/u appt.  

## 2018-05-16 NOTE — Discharge Instructions (Signed)
Vaginal Delivery, Care After °Refer to this sheet in the next few weeks. These instructions provide you with information about caring for yourself after vaginal delivery. Your health care provider may also give you more specific instructions. Your treatment has been planned according to current medical practices, but problems sometimes occur. Call your health care provider if you have any problems or questions. °What can I expect after the procedure? °After vaginal delivery, it is common to have: °· Some bleeding from your vagina. °· Soreness in your abdomen, your vagina, and the area of skin between your vaginal opening and your anus (perineum). °· Pelvic cramps. °· Fatigue. ° °Follow these instructions at home: °Medicines °· Take over-the-counter and prescription medicines only as told by your health care provider. °· If you were prescribed an antibiotic medicine, take it as told by your health care provider. Do not stop taking the antibiotic until it is finished. °Driving ° °· Do not drive or operate heavy machinery while taking prescription pain medicine. °· Do not drive for 24 hours if you received a sedative. °Lifestyle °· Do not drink alcohol. This is especially important if you are breastfeeding or taking medicine to relieve pain. °· Do not use tobacco products, including cigarettes, chewing tobacco, or e-cigarettes. If you need help quitting, ask your health care provider. °Eating and drinking °· Drink at least 8 eight-ounce glasses of water every day unless you are told not to by your health care provider. If you choose to breastfeed your baby, you may need to drink more water than this. °· Eat high-fiber foods every day. These foods may help prevent or relieve constipation. High-fiber foods include: °? Whole grain cereals and breads. °? Brown rice. °? Beans. °? Fresh fruits and vegetables. °Activity °· Return to your normal activities as told by your health care provider. Ask your health care provider  what activities are safe for you. °· Rest as much as possible. Try to rest or take a nap when your baby is sleeping. °· Do not lift anything that is heavier than your baby or 10 lb (4.5 kg) until your health care provider says that it is safe. °· Talk with your health care provider about when you can engage in sexual activity. This may depend on your: °? Risk of infection. °? Rate of healing. °? Comfort and desire to engage in sexual activity. °Vaginal Care °· If you have an episiotomy or a vaginal tear, check the area every day for signs of infection. Check for: °? More redness, swelling, or pain. °? More fluid or blood. °? Warmth. °? Pus or a bad smell. °· Do not use tampons or douches until your health care provider says this is safe. °· Watch for any blood clots that may pass from your vagina. These may look like clumps of dark red, brown, or black discharge. °General instructions °· Keep your perineum clean and dry as told by your health care provider. °· Wear loose, comfortable clothing. °· Wipe from front to back when you use the toilet. °· Ask your health care provider if you can shower or take a bath. If you had an episiotomy or a perineal tear during labor and delivery, your health care provider may tell you not to take baths for a certain length of time. °· Wear a bra that supports your breasts and fits you well. °· If possible, have someone help you with household activities and help care for your baby for at least a few days after   you leave the hospital.  Keep all follow-up visits for you and your baby as told by your health care provider. This is important. Contact a health care provider if:  You have: ? Vaginal discharge that has a bad smell. ? Difficulty urinating. ? Pain when urinating. ? A sudden increase or decrease in the frequency of your bowel movements. ? More redness, swelling, or pain around your episiotomy or vaginal tear. ? More fluid or blood coming from your episiotomy or  vaginal tear. ? Pus or a bad smell coming from your episiotomy or vaginal tear. ? A fever. ? A rash. ? Little or no interest in activities you used to enjoy. ? Questions about caring for yourself or your baby.  Your episiotomy or vaginal tear feels warm to the touch.  Your episiotomy or vaginal tear is separating or does not appear to be healing.  Your breasts are painful, hard, or turn red.  You feel unusually sad or worried.  You feel nauseous or you vomit.  You pass large blood clots from your vagina. If you pass a blood clot from your vagina, save it to show to your health care provider. Do not flush blood clots down the toilet without having your health care provider look at them.  You urinate more than usual.  You are dizzy or light-headed.  You have not breastfed at all and you have not had a menstrual period for 12 weeks after delivery.  You have stopped breastfeeding and you have not had a menstrual period for 12 weeks after you stopped breastfeeding. Get help right away if:  You have: ? Pain that does not go away or does not get better with medicine. ? Chest pain. ? Difficulty breathing. ? Blurred vision or spots in your vision. ? Thoughts about hurting yourself or your baby.  You develop pain in your abdomen or in one of your legs.  You develop a severe headache.  You faint.  You bleed from your vagina so much that you fill two sanitary pads in one hour. This information is not intended to replace advice given to you by your health care provider. Make sure you discuss any questions you have with your health care provider. Document Released: 06/14/2000 Document Revised: 11/29/2015 Document Reviewed: 07/02/2015 Elsevier Interactive Patient Education  2018 [employer] Home Care Instructions for Mom ACTIVITY  Gradually return to your regular activities.  Let yourself rest. Nap while your baby sleeps.  Avoid lifting anything that is heavier than 10 lb (4.5  kg) until your health care provider says it is okay.  Avoid activities that take a lot of effort and energy (are strenuous) until approved by your health care provider. Walking at a slow-to-moderate pace is usually safe.  If you had a cesarean delivery: ? Do not vacuum, climb stairs, or drive a car for 4-6 weeks. ? Have someone help you at home until you feel like you can do your usual activities yourself. ? Do exercises as told by your health care provider, if this applies.  VAGINAL BLEEDING You may continue to bleed for 4-6 weeks after delivery. Over time, the amount of blood usually decreases and the color of the blood usually gets lighter. However, the flow of bright red blood may increase if you have been too active. If you need to use more than one pad in an hour because your pad gets soaked, or if you pass a large clot:  Lie down.  Raise your feet.  Place  a cold compress on your lower abdomen.  Rest.  Call your health care provider.  If you are breastfeeding, your period should return anytime between 8 weeks after delivery and the time that you stop breastfeeding. If you are not breastfeeding, your period should return 6-8 weeks after delivery. PERINEAL CARE The perineal area, or perineum, is the part of your body between your thighs. After delivery, this area needs special care. Follow these instructions as told by your health care provider.  Take warm tub baths for 15-20 minutes.  Use medicated pads and pain-relieving sprays and creams as told.  Do not use tampons or douches until vaginal bleeding has stopped.  Each time you go to the bathroom: ? Use a peri bottle. ? Change your pad. ? Use towelettes in place of toilet paper until your stitches have healed.  Do Kegel exercises every day. Kegel exercises help to maintain the muscles that support the vagina, bladder, and bowels. You can do these exercises while you are standing, sitting, or lying down. To do Kegel  exercises: ? Tighten the muscles of your abdomen and the muscles that surround your birth canal. ? Hold for a few seconds. ? Relax. ? Repeat until you have done this 5 times in a row.  To prevent hemorrhoids from developing or getting worse: ? Drink enough fluid to keep your urine clear or pale yellow. ? Avoid straining when having a bowel movement. ? Take over-the-counter medicines and stool softeners as told by your health care provider.  BREAST CARE  Wear a tight-fitting bra.  Avoid taking over-the-counter pain medicine for breast discomfort.  Apply ice to the breasts to help with discomfort as needed: ? Put ice in a plastic bag. ? Place a towel between your skin and the bag. ? Leave the ice on for 20 minutes or as told by your health care provider.  NUTRITION  Eat a well-balanced diet.  Do not try to lose weight quickly by cutting back on calories.  Take your prenatal vitamins until your postpartum checkup or until your health care provider tells you to stop.  POSTPARTUM DEPRESSION You may find yourself crying for no apparent reason and unable to cope with all of the changes that come with having a newborn. This mood is called postpartum depression. Postpartum depression happens because your hormone levels change after delivery. If you have postpartum depression, get support from your partner, friends, and family. If the depression does not go away on its own after several weeks, contact your health care provider. BREAST SELF-EXAM Do a breast self-exam each month, at the same time of the month. If you are breastfeeding, check your breasts just after a feeding, when your breasts are less full. If you are breastfeeding and your period has started, check your breasts on day 5, 6, or 7 of your period. Report any lumps, bumps, or discharge to your health care provider. Know that breasts are normally lumpy if you are breastfeeding. This is temporary, and it is not a health  risk. INTIMACY AND SEXUALITY Avoid sexual activity for at least 3-4 weeks after delivery or until the brownish-red vaginal flow is completely gone. If you want to avoid pregnancy, use some form of birth control. You can get pregnant after delivery, even if you have not had your period. SEEK MEDICAL CARE IF:  You feel unable to cope with the changes that a child brings to your life, and these feelings do not go away after several weeks.  You  notice a lump, a bump, or discharge on your breast.  SEEK IMMEDIATE MEDICAL CARE IF:  Blood soaks your pad in 1 hour or less.  You have: ? Severe pain or cramping in your lower abdomen. ? A bad-smelling vaginal discharge. ? A fever that is not controlled by medicine. ? A fever, and an area of your breast is red and sore. ? Pain or redness in your calf. ? Sudden, severe chest pain. ? Shortness of breath. ? Painful or bloody urination. ? Problems with your vision.  You vomit for 12 hours or longer.  You develop a severe headache.  You have serious thoughts about hurting yourself, your child, or anyone else.  This information is not intended to replace advice given to you by your health care provider. Make sure you discuss any questions you have with your health care provider. Document Released: 06/14/2000 Document Revised: 11/23/2015 Document Reviewed: 12/19/2014 Elsevier Interactive Patient Education  2017 [employer] Postpartum Care After Vaginal Delivery The period of time right after you deliver your newborn is called the postpartum period. What kind of medical care will I receive?  You may continue to receive fluids and medicines through an IV tube inserted into one of your veins.  If an incision was made near your vagina (episiotomy) or if you had some vaginal tearing during delivery, cold compresses may be placed on your episiotomy or your tear. This helps to reduce pain and swelling.  You may be given a squirt bottle to use when  you go to the bathroom. You may use this until you are comfortable wiping as usual. To use the squirt bottle, follow these steps: ? Before you urinate, fill the squirt bottle with warm water. Do not use hot water. ? After you urinate, while you are sitting on the toilet, use the squirt bottle to rinse the area around your urethra and vaginal opening. This rinses away any urine and blood. ? You may do this instead of wiping. As you start healing, you may use the squirt bottle before wiping yourself. Make sure to wipe gently. ? Fill the squirt bottle with clean water every time you use the bathroom.  You will be given sanitary pads to wear. How can I expect to feel?  You may not feel the need to urinate for several hours after delivery.  You will have some soreness and pain in your abdomen and vagina.  If you are breastfeeding, you may have uterine contractions every time you breastfeed for up to several weeks postpartum. Uterine contractions help your uterus return to its normal size.  It is normal to have vaginal bleeding (lochia) after delivery. The amount and appearance of lochia is often similar to a menstrual period in the first week after delivery. It will gradually decrease over the next few weeks to a dry, yellow-brown discharge. For most women, lochia stops completely by 6-8 weeks after delivery. Vaginal bleeding can vary from woman to woman.  Within the first few days after delivery, you may have breast engorgement. This is when your breasts feel heavy, full, and uncomfortable. Your breasts may also throb and feel hard, tightly stretched, warm, and tender. After this occurs, you may have milk leaking from your breasts.Your health care provider can help you relieve discomfort due to breast engorgement. Breast engorgement should go away within a few days.  You may feel more sad or worried than normal due to hormonal changes after delivery. These feelings should not last more than a  few  days. If these feelings do not go away after several days, speak with your health care provider. How should I care for myself?  Tell your health care provider if you have pain or discomfort.  Drink enough water to keep your urine clear or pale yellow.  Wash your hands thoroughly with soap and water for at least 20 seconds after changing your sanitary pads, after using the toilet, and before holding or feeding your baby.  If you are not breastfeeding, avoid touching your breasts a lot. Doing this can make your breasts produce more milk.  If you become weak or lightheaded, or you feel like you might faint, ask for help before: ? Getting out of bed. ? Showering.  Change your sanitary pads frequently. Watch for any changes in your flow, such as a sudden increase in volume, a change in color, the passing of large blood clots. If you pass a blood clot from your vagina, save it to show to your health care provider. Do not flush blood clots down the toilet without having your health care provider look at them.  Make sure that all your vaccinations are up to date. This can help protect you and your baby from getting certain diseases. You may need to have immunizations done before you leave the hospital.  If desired, talk with your health care provider about methods of family planning or birth control (contraception). How can I start bonding with my baby? Spending as much time as possible with your baby is very important. During this time, you and your baby can get to know each other and develop a bond. Having your baby stay with you in your room (rooming in) can give you time to get to know your baby. Rooming in can also help you become comfortable caring for your baby. Breastfeeding can also help you bond with your baby. How can I plan for returning home with my baby?  Make sure that you have a car seat installed in your vehicle. ? Your car seat should be checked by a certified car seat installer to  make sure that it is installed safely. ? Make sure that your baby fits into the car seat safely.  Ask your health care provider any questions you have about caring for yourself or your baby. Make sure that you are able to contact your health care provider with any questions after leaving the hospital. This information is not intended to replace advice given to you by your health care provider. Make sure you discuss any questions you have with your health care provider. Document Released: 04/14/2007 Document Revised: 11/20/2015 Document Reviewed: 05/22/2015 Elsevier Interactive Patient Education  2018 [employer]. Postpartum Depression and Baby Blues The postpartum period begins right after the birth of a baby. During this time, there is often a great amount of joy and excitement. It is also a time of many changes in the life of the parents. Regardless of how many times a mother gives birth, each child brings new challenges and dynamics to the family. It is not unusual to have feelings of excitement along with confusing shifts in moods, emotions, and thoughts. All mothers are at risk of developing postpartum depression or the "baby blues." These mood changes can occur right after giving birth, or they may occur many months after giving birth. The baby blues or postpartum depression can be mild or severe. Additionally, postpartum depression can go away rather quickly, or it can be a long-term condition. What are  the causes? Raised hormone levels and the rapid drop in those levels are thought to be a main cause of postpartum depression and the baby blues. A number of hormones change during and after pregnancy. Estrogen and progesterone usually decrease right after the delivery of your baby. The levels of thyroid hormone and various cortisol steroids also rapidly drop. Other factors that play a role in these mood changes include major life events and genetics. What increases the risk? If you have any of  the following risks for the baby blues or postpartum depression, know what symptoms to watch out for during the postpartum period. Risk factors that may increase the likelihood of getting the baby blues or postpartum depression include:  Having a personal or family history of depression.  Having depression while being pregnant.  Having premenstrual mood issues or mood issues related to oral contraceptives.  Having a lot of life stress.  Having marital conflict.  Lacking a social support network.  Having a baby with special needs.  Having health problems, such as diabetes.  What are the signs or symptoms? Symptoms of baby blues include:  Brief changes in mood, such as going from extreme happiness to sadness.  Decreased concentration.  Difficulty sleeping.  Crying spells, tearfulness.  Irritability.  Anxiety.  Symptoms of postpartum depression typically begin within the first month after giving birth. These symptoms include:  Difficulty sleeping or excessive sleepiness.  Marked weight loss.  Agitation.  Feelings of worthlessness.  Lack of interest in activity or food.  Postpartum psychosis is a very serious condition and can be dangerous. Fortunately, it is rare. Displaying any of the following symptoms is cause for immediate medical attention. Symptoms of postpartum psychosis include:  Hallucinations and delusions.  Bizarre or disorganized behavior.  Confusion or disorientation.  How is this diagnosed? A diagnosis is made by an evaluation of your symptoms. There are no medical or lab tests that lead to a diagnosis, but there are various questionnaires that a health care provider may use to identify those with the baby blues, postpartum depression, or psychosis. Often, a screening tool called the [place] Postnatal Depression Scale is used to diagnose depression in the postpartum period. How is this treated? The baby blues usually goes away on its own in 1-2  weeks. Social support is often all that is needed. You will be encouraged to get adequate sleep and rest. Occasionally, you may be given medicines to help you sleep. Postpartum depression requires treatment because it can last several months or longer if it is not treated. Treatment may include individual or group therapy, medicine, or both to address any social, physiological, and psychological factors that may play a role in the depression. Regular exercise, a healthy diet, rest, and social support may also be strongly recommended. Postpartum psychosis is more serious and needs treatment right away. Hospitalization is often needed. Follow these instructions at home:  Get as much rest as you can. Nap when the baby sleeps.  Exercise regularly. Some women find yoga and walking to be beneficial.  Eat a balanced and nourishing diet.  Do little things that you enjoy. Have a cup of tea, take a bubble bath, read your favorite magazine, or listen to your favorite music.  Avoid alcohol.  Ask for help with household chores, cooking, grocery shopping, or running errands as needed. Do not try to do everything.  Talk to people close to you about how you are feeling. Get support from your partner, family members, friends, or other  new moms.  Try to stay positive in how you think. Think about the things you are grateful for.  Do not spend a lot of time alone.  Only take over-the-counter or prescription medicine as directed by your health care provider.  Keep all your postpartum appointments.  Let your health care provider know if you have any concerns. Contact a health care provider if: You are having a reaction to or problems with your medicine. Get help right away if:  You have suicidal feelings.  You think you may harm the baby or someone else. This information is not intended to replace advice given to you by your health care provider. Make sure you discuss any questions you have with your  health care provider. Document Released: 03/21/2004 Document Revised: 11/23/2015 Document Reviewed: 03/29/2013 Elsevier Interactive Patient Education  2017 [employer].

## 2018-05-18 NOTE — Anesthesia Postprocedure Evaluation (Signed)
Anesthesia Post Note  Patient: Teresa Ortiz  Procedure(s) Performed: AN AD HOC LABOR EPIDURAL  Comments: Patient was discharged from facility before the post op epidural team was able to evaluate her.  Patient called but did not pick up.     Last Vitals: There were no vitals filed for this visit.  Last Pain: There were no vitals filed for this visit.               Cleda MccreedyJoseph K 

## 2018-05-19 ENCOUNTER — Other Ambulatory Visit: Payer: Medicaid Other

## 2018-05-19 ENCOUNTER — Encounter: Payer: Medicaid Other | Admitting: Certified Nurse Midwife

## 2018-06-26 ENCOUNTER — Ambulatory Visit (INDEPENDENT_AMBULATORY_CARE_PROVIDER_SITE_OTHER): Payer: Medicaid Other | Admitting: Certified Nurse Midwife

## 2018-06-26 ENCOUNTER — Encounter: Payer: Medicaid Other | Admitting: Certified Nurse Midwife

## 2018-06-26 ENCOUNTER — Encounter: Payer: Self-pay | Admitting: Certified Nurse Midwife

## 2018-06-26 NOTE — Progress Notes (Signed)
Subjective:    Teresa Ortiz is a 27 y.o. [phone] Caucasian female who presents for a postpartum visit. She is 6 weeks postpartum following a spontaneous vaginal delivery at 39.3 gestational weeks. Anesthesia: epidural. I have fully reviewed the prenatal and intrapartum course. Postpartum course has been WNL. Baby's course has been WNL. Baby is feeding by breast. Bleeding no bleeding. Bowel function is normal. Bladder function is normal. Patient is not sexually active. Last sexual activity: prior to delivery Contraception method is none. Postpartum depression screening: negative. Score 0.  Last pap 6/2017and was negative.  The following portions of the patient's history were reviewed and updated as appropriate: allergies, current medications, past medical history, past surgical history and problem list.  Review of Systems Pertinent items are noted in HPI.   Vitals:   06/26/18 1443  BP: 102/68  Pulse: 69  Weight: 193 lb 11.2 oz (87.9 kg)  Height: 5\' 2"  (1.575 m)   Patient's last menstrual period was 08/12/2017 (exact date).  Objective:   General:  alert, cooperative and no distress   Breasts:  deferred, no complaints  Lungs: clear to auscultation bilaterally  Heart:  regular rate and rhythm  Abdomen: soft, nontender   Vulva: normal  Vagina: normal vagina  Cervix:  closed  Corpus: Well-involuted  Adnexa:  Non-palpable  Rectal Exam: no hemorrhoids        Assessment:   Postpartum exam 6 wks s/p SVD breastfeeding Depression screening Contraception counseling   Plan:  : none Plans nexplanon.  Follow up in: 3 days for nexplanon or earlier if needed  Teresa Ortiz, CNM

## 2018-06-26 NOTE — Patient Instructions (Signed)
Preventive Care 18-39 Years, Female Preventive care refers to lifestyle choices and visits with your health care provider that can promote health and wellness. What does preventive care include?   A yearly physical exam. This is also called an annual well check.  Dental exams once or twice a year.  Routine eye exams. Ask your health care provider how often you should have your eyes checked.  Personal lifestyle choices, including: ? Daily care of your teeth and gums. ? Regular physical activity. ? Eating a healthy diet. ? Avoiding tobacco and drug use. ? Limiting alcohol use. ? Practicing safe sex. ? Taking vitamin and mineral supplements as recommended by your health care provider. What happens during an annual well check? The services and screenings done by your health care provider during your annual well check will depend on your age, overall health, lifestyle risk factors, and family history of disease. Counseling Your health care provider may ask you questions about your:  Alcohol use.  Tobacco use.  Drug use.  Emotional well-being.  Home and relationship well-being.  Sexual activity.  Eating habits.  Work and work environment.  Method of birth control.  Menstrual cycle.  Pregnancy history. Screening You may have the following tests or measurements:  Height, weight, and BMI.  Diabetes screening. This is done by checking your blood sugar (glucose) after you have not eaten for a while (fasting).  Blood pressure.  Lipid and cholesterol levels. These may be checked every 5 years starting at age 20.  Skin check.  Hepatitis C blood test.  Hepatitis B blood test.  Sexually transmitted disease (STD) testing.  BRCA-related cancer screening. This may be done if you have a family history of breast, ovarian, tubal, or peritoneal cancers.  Pelvic exam and Pap test. This may be done every 3 years starting at age 21. Starting at age 30, this may be done every 5  years if you have a Pap test in combination with an HPV test. Discuss your test results, treatment options, and if necessary, the need for more tests with your health care provider. Vaccines Your health care provider may recommend certain vaccines, such as:  Influenza vaccine. This is recommended every year.  Tetanus, diphtheria, and acellular pertussis (Tdap, Td) vaccine. You may need a Td booster every 10 years.  Varicella vaccine. You may need this if you have not been vaccinated.  HPV vaccine. If you are 26 or younger, you may need three doses over 6 months.  Measles, mumps, and rubella (MMR) vaccine. You may need at least one dose of MMR. You may also need a second dose.  Pneumococcal 13-valent conjugate (PCV13) vaccine. You may need this if you have certain conditions and were not previously vaccinated.  Pneumococcal polysaccharide (PPSV23) vaccine. You may need one or two doses if you smoke cigarettes or if you have certain conditions.  Meningococcal vaccine. One dose is recommended if you are age 19-21 years and a first-year college student living in a residence hall, or if you have one of several medical conditions. You may also need additional booster doses.  Hepatitis A vaccine. You may need this if you have certain conditions or if you travel or work in places where you may be exposed to hepatitis A.  Hepatitis B vaccine. You may need this if you have certain conditions or if you travel or work in places where you may be exposed to hepatitis B.  Haemophilus influenzae type b (Hib) vaccine. You may need this if you   have certain risk factors. Talk to your health care provider about which screenings and vaccines you need and how often you need them. This information is not intended to replace advice given to you by your health care provider. Make sure you discuss any questions you have with your health care provider. Document Released: 08/13/2001 Document Revised: 01/28/2017  Document Reviewed: 04/18/2015 Elsevier Interactive Patient Education  2019 [employer].

## 2018-06-29 ENCOUNTER — Encounter: Payer: Medicaid Other | Admitting: Certified Nurse Midwife

## 2018-06-30 ENCOUNTER — Encounter: Payer: Self-pay | Admitting: Certified Nurse Midwife

## 2018-06-30 ENCOUNTER — Ambulatory Visit (INDEPENDENT_AMBULATORY_CARE_PROVIDER_SITE_OTHER): Payer: Medicaid Other | Admitting: Certified Nurse Midwife

## 2018-06-30 VITALS — BP 113/69 | HR 63 | Ht 62.0 in | Wt 194.0 lb

## 2018-06-30 DIAGNOSIS — Z30017 Encounter for initial prescription of implantable subdermal contraceptive: Secondary | ICD-10-CM

## 2018-06-30 DIAGNOSIS — Z3202 Encounter for pregnancy test, result negative: Secondary | ICD-10-CM

## 2018-06-30 DIAGNOSIS — Z3049 Encounter for surveillance of other contraceptives: Secondary | ICD-10-CM

## 2018-06-30 LAB — POCT URINE PREGNANCY: PREG TEST UR: NEGATIVE

## 2018-06-30 NOTE — Patient Instructions (Signed)
Nexplanon Instructions After Insertion  Keep bandage clean and dry for 24 hours  May use ice/Tylenol/Ibuprofen for soreness or pain  If you develop fever, drainage or increased warmth from incision site-contact office immediately   

## 2018-06-30 NOTE — Progress Notes (Signed)
Teresa Ortiz is a 27 y.o. year old Caucasian female here for Nexplanon insertion.  Patient's last menstrual period was 08/12/2017 (exact date)., and her pregnancy test today was negative.  Risks/benefits/side effects of Nexplanon have been discussed and her questions have been answered.  Specifically, a failure rate of 07/998 has been reported, with an increased failure rate if pt takes St. John's Wort and/or antiseizure medicaitons.  Teresa Ortiz is aware of the common side effect of irregular bleeding, which the incidence of decreases over time.  BP 113/69   Pulse 63   Ht 5\' 2"  (1.575 m)   Wt 194 lb (88 kg)   LMP 08/12/2017 (Exact Date)   Breastfeeding Yes   BMI 35.48 kg/m   Results for orders placed or performed in visit on 06/30/18 (from the past 24 hour(s))  POCT urine pregnancy   Collection Time: 06/30/18 10:16 AM  Result Value Ref Range   Preg Test, Ur Negative Negative     She is right-handed, so her left arm, approximately 4 inches proximal from the elbow, was cleansed with alcohol and anesthetized with 2cc of 2% Lidocaine.  The area was cleansed again with betadine and the Nexplanon was inserted per manufacturer's recommendations without difficulty.  A steri-strip and pressure bandage were applied.  Pt was instructed to keep the area clean and dry, remove pressure bandage in 24 hours, and keep insertion site covered with the steri-strip for 3-5 days.  Back up contraception was recommended for 2 weeks.  She was given a card indicating date Nexplanon was inserted and date it needs to be removed. Follow-up PRN problems.  Pattricia BossAnnie Bettie Capistran,CNM

## 2019-03-12 ENCOUNTER — Other Ambulatory Visit: Payer: Self-pay

## 2019-03-12 ENCOUNTER — Emergency Department
Admission: EM | Admit: 2019-03-12 | Discharge: 2019-03-12 | Disposition: A | Discharge status: Home / Self Care | Payer: Self-pay | Attending: Emergency Medicine | Admitting: Emergency Medicine

## 2019-03-12 ENCOUNTER — Emergency Department: Payer: Self-pay

## 2019-03-12 ENCOUNTER — Encounter: Payer: Self-pay | Admitting: Emergency Medicine

## 2019-03-12 DIAGNOSIS — M5440 Lumbago with sciatica, unspecified side: Secondary | ICD-10-CM | POA: Insufficient documentation

## 2019-03-12 DIAGNOSIS — M79604 Pain in right leg: Secondary | ICD-10-CM | POA: Insufficient documentation

## 2019-03-12 DIAGNOSIS — N200 Calculus of kidney: Secondary | ICD-10-CM | POA: Insufficient documentation

## 2019-03-12 DIAGNOSIS — M79605 Pain in left leg: Secondary | ICD-10-CM | POA: Insufficient documentation

## 2019-03-12 DIAGNOSIS — M542 Cervicalgia: Secondary | ICD-10-CM | POA: Insufficient documentation

## 2019-03-12 DIAGNOSIS — J45909 Unspecified asthma, uncomplicated: Secondary | ICD-10-CM | POA: Insufficient documentation

## 2019-03-12 MED ORDER — CYCLOBENZAPRINE HCL 10 MG PO TABS: 10.0000 mg | ORAL_TABLET | Freq: Three times a day (TID) | ORAL | 0 refills | Status: DC | PRN

## 2019-03-12 MED ORDER — TRAMADOL HCL 50 MG PO TABS: 50.0000 mg | ORAL_TABLET | Freq: Two times a day (BID) | ORAL | 0 refills | Status: DC | PRN

## 2019-03-12 NOTE — ED Triage Notes (Signed)
Patient is complaining of upper back and neck pain and stiffness x 3 days.  Patient is in no obvious distress at this time.  Ambulatory with steady gait.  Denies any known injury.  Patient is afebrile.

## 2019-03-12 NOTE — ED Notes (Signed)
See triage note  States she developed some stiffness to neck couple of days ago  Now having mid back pain which is moving up into neck  States makes her arms feel weak  Denies any injury or fever

## 2019-03-12 NOTE — ED Provider Notes (Signed)
[facility] Emergency Department Provider Note   ____________________________________________   First MD Initiated Contact with Patient 03/12/19 1219     (approximate)  I have reviewed the triage vital signs and the nursing notes.   HISTORY  Chief Complaint Back Pain and Neck Pain    HPI Teresa Ortiz is a 28 y.o. female patient complained of 2 to 3 days of increasing atraumatic neck pain.  Patient has radicular component to the right upper extremity.  Patient also complained radicular back pain to the bilateral lower extremities.  Patient denies bladder or bowel dysfunction.  Patient rates the pain is 8/10.  Patient described pain is "achy".  No palliative measures for complaint.         Past Medical History:  Diagnosis Date  . Asthma    childhood  . Frequent headaches   . Migraine without aura     Patient Active Problem List   Diagnosis Date Noted  . Labor and delivery, indication for care 05/15/2018  . B12 deficiency anemia 05/01/2018  . Anemia affecting pregnancy 02/26/2018  . Pregnancy 01/30/2018  . Migraine without aura 05/11/2014    Past Surgical History:  Procedure Laterality Date  . DILATION AND EVACUATION N/A 07/16/2017   Procedure: DILATATION AND EVACUATION;  Surgeon: Harlin Heys, MD;  Location: [facility] ORS;  Service: Gynecology;  Laterality: N/A;    Prior to Admission medications   Medication Sig Start Date End Date Taking? Authorizing Provider  cyclobenzaprine (FLEXERIL) 10 MG tablet Take 1 tablet (10 mg total) by mouth 3 (three) times daily as needed. 03/12/19   Sable Feil, PA-C  etonogestrel (NEXPLANON) 68 MG IMPL implant 1 each by Subdermal route once.    [provider]  traMADol (ULTRAM) 50 MG tablet Take 1 tablet (50 mg total) by mouth every 12 (twelve) hours as needed. 03/12/19   Sable Feil, PA-C    Allergies Sulfa antibiotics  Family History  Problem Relation Age of Onset  . Mental  illness Mother        bipolor/depressed  . Cervical cancer Mother   . Alcohol abuse Father     Social History Social History   Tobacco Use  . Smoking status: Never Smoker  . Smokeless tobacco: Never Used  Substance Use Topics  . Alcohol use: No    Alcohol/week: 0.0 standard drinks    Comment: rarely  . Drug use: No    Review of Systems Constitutional: No fever/chills Eyes: No visual changes. ENT: No sore throat. Cardiovascular: Denies chest pain. Respiratory: Denies shortness of breath. Gastrointestinal: No abdominal pain.  No nausea, no vomiting.  No diarrhea.  No constipation. Genitourinary: Negative for dysuria. Musculoskeletal: Neck and back pain.   Skin: Negative for rash. Neurological: Negative for headaches, focal weakness or numbness. Allergic/Immunilogical: Sulfa antibiotics ____________________________________________   PHYSICAL EXAM:  VITAL SIGNS: ED Triage Vitals  Enc Vitals Group     BP 03/12/19 1156 109/69     Pulse Rate 03/12/19 1156 87     Resp --      Temp 03/12/19 1156 98.5 F (36.9 C)     Temp Source 03/12/19 1156 Oral     SpO2 03/12/19 1156 98 %     Weight 03/12/19 1156 190 lb (86.2 kg)     Height 03/12/19 1156 5\' 10"  (1.778 m)     Head Circumference --      Peak Flow --      Pain Score 03/12/19 1206 8  Pain Loc --      Pain Edu? --      Excl. in GC? --    Constitutional: Alert and oriented. Well appearing and in no acute distress. Neck: No cervical spine tenderness to palpation. Cardiovascular: Normal rate, regular rhythm. Grossly normal heart sounds.  Good peripheral circulation. Respiratory: Normal respiratory effort.  No retractions. Lungs CTAB. Musculoskeletal: No lower extremity tenderness nor edema.  No joint effusions. Neurologic:  Normal speech and language. No gross focal neurologic deficits are appreciated. No gait instability. Skin:  Skin is warm, dry and intact. No rash noted. Psychiatric: Mood and affect are normal.  Speech and behavior are normal.  ____________________________________________   LABS (all labs ordered are listed, but only abnormal results are displayed)  Labs Reviewed - No data to display ____________________________________________  EKG   ____________________________________________  RADIOLOGY  ED MD interpretation:    Official radiology report(s): Dg Cervical Spine 2-3 Views  Result Date: 03/12/2019 CLINICAL DATA:  Radiating neck pain, no known injury EXAM: CERVICAL SPINE - 2-3 VIEW COMPARISON:  None. FINDINGS: No fracture or static subluxation of the cervical spine. Disc spaces and vertebral body heights are preserved. Normal alignment. The partially imaged skull base and upper chest are unremarkable. IMPRESSION: No fracture or static subluxation of the cervical spine. Disc spaces and vertebral body heights are preserved. Cervical disc and neural foraminal pathology may be further evaluated by MRI if indicated by localizing signs and symptoms. Electronically Signed   By: Lauralyn PrimesAlex  Bibbey M.D.   On: 03/12/2019 13:47   Dg Lumbar Spine 2-3 Views  Result Date: 03/12/2019 CLINICAL DATA:  Back pain, no trauma EXAM: LUMBAR SPINE - 2-3 VIEW COMPARISON:  03/15/2015 FINDINGS: No fracture or dislocation of the lumbar spine. Disc spaces and vertebral body heights are preserved. Normal alignment. Nonobstructive calculus of the midportion of the left kidney. Nonobstructive pattern of overlying bowel gas. IMPRESSION: 1. No fracture or dislocation of the lumbar spine. Disc spaces and vertebral body heights are preserved. Lumbar disc and neural foraminal pathology may be further evaluated by MRI if indicated by localizing signs and symptoms. 2.  Nonobstructive left nephrolithiasis. Electronically Signed   By: Lauralyn PrimesAlex  Bibbey M.D.   On: 03/12/2019 13:50    ____________________________________________   PROCEDURES  Procedure(s) performed (including Critical Care):  Procedures    ____________________________________________   INITIAL IMPRESSION / ASSESSMENT AND PLAN / ED COURSE  As part of my medical decision making, I reviewed the following data within the electronic MEDICAL RECORD NUMBER      Teresa Ortiz was evaluated in Emergency Department on 03/12/2019 for the symptoms described in the history of present illness. She was evaluated in the context of the global COVID-19 pandemic, which necessitated consideration that the patient might be at risk for infection with the SARS-CoV-2 virus that causes COVID-19. Institutional protocols and algorithms that pertain to the evaluation of patients at risk for COVID-19 are in a state of rapid change based on information released by regulatory bodies including the CDC and federal and state organizations. These policies and algorithms were followed during the patient's care in the ED.  Patient presents with radicular neck and back pain.  Physical exam is grossly unremarkable.  Discussed x-ray findings with patient with a finding of a left kidney nonobstructing stone.  Patient given discharge care instructions advised to [facility] if condition persist.         ____________________________________________   FINAL CLINICAL IMPRESSION(S) / ED DIAGNOSES  Final diagnoses:  Neck  pain  Low back pain with sciatica, sciatica laterality unspecified, unspecified back pain laterality, unspecified chronicity  Kidney stone on left side     ED Discharge Orders         Ordered    cyclobenzaprine (FLEXERIL) 10 MG tablet  3 times daily PRN     03/12/19 1406    traMADol (ULTRAM) 50 MG tablet  Every 12 hours PRN     03/12/19 1406           Note:  This document was prepared using Dragon voice recognition software and may include unintentional dictation errors.    Joni Reining, PA-C 03/12/19 1410    Minna Antis, MD 03/12/19 1500

## 2019-05-07 ENCOUNTER — Emergency Department
Admission: EM | Admit: 2019-05-07 | Discharge: 2019-05-07 | Disposition: A | Discharge status: Home / Self Care | Payer: Self-pay | Attending: Emergency Medicine | Admitting: Emergency Medicine

## 2019-05-07 ENCOUNTER — Emergency Department: Payer: Self-pay

## 2019-05-07 ENCOUNTER — Other Ambulatory Visit: Payer: Self-pay

## 2019-05-07 DIAGNOSIS — R161 Splenomegaly, not elsewhere classified: Secondary | ICD-10-CM | POA: Insufficient documentation

## 2019-05-07 DIAGNOSIS — M899 Disorder of bone, unspecified: Secondary | ICD-10-CM | POA: Insufficient documentation

## 2019-05-07 DIAGNOSIS — N3001 Acute cystitis with hematuria: Secondary | ICD-10-CM | POA: Insufficient documentation

## 2019-05-07 DIAGNOSIS — N200 Calculus of kidney: Secondary | ICD-10-CM | POA: Insufficient documentation

## 2019-05-07 LAB — URINALYSIS, COMPLETE (UACMP) WITH MICROSCOPIC
Bilirubin Urine: NEGATIVE
Glucose, UA: NEGATIVE mg/dL
Ketones, ur: NEGATIVE mg/dL
Nitrite: NEGATIVE
Protein, ur: NEGATIVE mg/dL
Specific Gravity, Urine: 1.02 (ref 1.005–1.030)
WBC, UA: >50 WBC/hpf — ABNORMAL HIGH (ref 0–5)
pH: 5 (ref 5.0–8.0)

## 2019-05-07 LAB — POCT PREGNANCY, URINE: Preg Test, Ur: NEGATIVE

## 2019-05-07 MED ORDER — CEFDINIR 300 MG PO CAPS: 300.0000 mg | ORAL_CAPSULE | Freq: Two times a day (BID) | ORAL | 0 refills | Status: DC

## 2019-05-07 MED ORDER — CEFTRIAXONE SODIUM 1 G IJ SOLR
1.0000 g | Freq: Once | INTRAMUSCULAR | Status: AC
  Administered 2019-05-07: 1 g via INTRAMUSCULAR
  Filled 2019-05-07: qty 10

## 2019-05-07 MED ORDER — LIDOCAINE HCL (PF) 1 % IJ SOLN
5.0000 mL | Freq: Once | INTRAMUSCULAR | Status: AC
  Administered 2019-05-07: 5 mL
  Filled 2019-05-07: qty 5

## 2019-05-07 NOTE — ED Provider Notes (Signed)
[facility] Emergency Department Provider Note  ____________________________________________  Time seen: Approximately 11:37 AM  I have reviewed the triage vital signs and the nursing notes.   HISTORY  Chief Complaint Urinary Frequency    HPI Teresa Ortiz is a 28 y.o. female that presents to the emergency department for evaluation of dysuria, frequency and urgency of urination for 2 weeks and left mid back pain for 1 day.  Patient had a kidney stone on the right side about 2 months ago.  She tried to call her primary care provider this morning but they did not have any appointments.  Patient states that her new insurance will start this next week.  Last menstrual period was about 3 weeks ago.  No hematuria.  No fevers, nausea, vomiting, abdominal pain.   Past Medical History:  Diagnosis Date  . Asthma    childhood  . Frequent headaches   . Migraine without aura     Patient Active Problem List   Diagnosis Date Noted  . Labor and delivery, indication for care 05/15/2018  . B12 deficiency anemia 05/01/2018  . Anemia affecting pregnancy 02/26/2018  . Pregnancy 01/30/2018  . Migraine without aura 05/11/2014    Past Surgical History:  Procedure Laterality Date  . DILATION AND EVACUATION N/A 07/16/2017   Procedure: DILATATION AND EVACUATION;  Surgeon: Linzie Collin, MD;  Location: [facility] ORS;  Service: Gynecology;  Laterality: N/A;    Prior to Admission medications   Medication Sig Start Date End Date Taking? Authorizing Provider  cefdinir (OMNICEF) 300 MG capsule Take 1 capsule (300 mg total) by mouth 2 (two) times daily. 05/07/19   Enid Derry, PA-C  etonogestrel (NEXPLANON) 68 MG IMPL implant 1 each by Subdermal route once.    [provider]    Allergies Sulfa antibiotics  Family History  Problem Relation Age of Onset  . Mental illness Mother        bipolor/depressed  . Cervical cancer Mother   . Alcohol abuse Father      Social History Social History   Tobacco Use  . Smoking status: Never Smoker  . Smokeless tobacco: Never Used  Substance Use Topics  . Alcohol use: No    Alcohol/week: 0.0 standard drinks    Comment: rarely  . Drug use: No     Review of Systems  Constitutional: No fever Respiratory:  No SOB. Gastrointestinal: No abdominal pain.  No nausea, no vomiting.  Genitourinary: Positive for dysuria and urgency of urination. Musculoskeletal: Negative for musculoskeletal pain. Skin: Negative for rash, abrasions, lacerations, ecchymosis.   ____________________________________________   PHYSICAL EXAM:  VITAL SIGNS: ED Triage Vitals  Enc Vitals Group     BP 05/07/19 1029 118/69     Pulse Rate 05/07/19 1029 98     Resp 05/07/19 1029 18     Temp 05/07/19 1029 98.3 F (36.8 C)     Temp Source 05/07/19 1029 Oral     SpO2 05/07/19 1029 99 %     Weight 05/07/19 1033 185 lb (83.9 kg)     Height 05/07/19 1033  (1.778 m)     Head Circumference --      Peak Flow --      Pain Score 05/07/19 1032 5     Pain Loc --      Pain Edu? --      Excl. in GC? --      Constitutional: Alert and oriented. Well appearing and in no acute distress. Eyes:  Conjunctivae are normal. PERRL. EOMI. Head: Atraumatic. ENT:      Ears:      Nose: No congestion/rhinnorhea.      Mouth/Throat: Mucous membranes are moist.  Neck: No stridor.   Cardiovascular: Normal rate, regular rhythm.  Good peripheral circulation. Respiratory: Normal respiratory effort without tachypnea or retractions. Lungs CTAB. Good air entry to the bases with no decreased or absent breath sounds. Gastrointestinal: Bowel sounds 4 quadrants. Soft and nontender to palpation. No guarding or rigidity. No palpable masses. No distention.  Mild left CVA tenderness. Musculoskeletal: Full range of motion to all extremities. No gross deformities appreciated.  Normal gait. Neurologic:  Normal speech and language. No gross focal neurologic  deficits are appreciated.  Skin:  Skin is warm, dry and intact. No rash noted. Psychiatric: Mood and affect are normal. Speech and behavior are normal. Patient exhibits appropriate insight and judgement.   ____________________________________________   LABS (all labs ordered are listed, but only abnormal results are displayed)  Labs Reviewed  URINALYSIS, COMPLETE (UACMP) WITH MICROSCOPIC - Abnormal; Notable for the following components:      Result Value   Color, Urine YELLOW (*)    APPearance CLOUDY (*)    Hgb urine dipstick SMALL (*)    Leukocytes,Ua MODERATE (*)    WBC, UA >50 (*)    Bacteria, UA RARE (*)    All other components within normal limits  URINE CULTURE  POCT PREGNANCY, URINE  POC URINE PREG, ED   ____________________________________________  EKG   ____________________________________________  RADIOLOGY Lexine Baton, personally viewed and evaluated these images (plain radiographs) as part of my medical decision making, as well as reviewing the written report by the radiologist.  Ct Renal Stone Study  Result Date: 05/07/2019 CLINICAL DATA:  Flank region pain with urinary frequency and dysuria EXAM: CT ABDOMEN AND PELVIS WITHOUT CONTRAST TECHNIQUE: Multidetector CT imaging of the abdomen and pelvis was performed following the standard protocol without oral or IV contrast. COMPARISON:  None. FINDINGS: Lower chest: Lung bases are clear. Hepatobiliary: No focal liver lesions are evident on this noncontrast enhanced study. Gallbladder wall is not appreciably thickened. There is no biliary duct dilatation. Pancreas: There is no pancreatic mass or inflammatory focus. Spleen: Spleen measures 15.0 x 8.5 x 11.9 cm with a measured splenic volume of 759 cubic cm. No focal splenic lesions are evident. Adrenals/Urinary Tract: Adrenals appear unremarkable. There is no evident renal mass or hydronephrosis on either side. There is a calculus in the lower pole of the left kidney  measuring 7 x 6 mm. There is no ureteral calculus on either side. Urinary bladder is midline with wall thickness within normal limits. Stomach/Bowel: There is no appreciable bowel wall or mesenteric thickening. There is no evident bowel obstruction. There is moderate stool in the colon. The terminal ileum appears normal. There is no evident free air or portal venous air. Vascular/Lymphatic: There is no abdominal aortic aneurysm. No vascular lesions are evident. No adenopathy is appreciable in the abdomen or pelvis. Reproductive: Uterus is retroverted.  No pelvic mass evident. Other: Appendix appears normal. There is no evident abscess or ascites in the abdomen or pelvis. Musculoskeletal: There is a mixed lucent and sclerotic area occupying much of the left femoral neck region measuring 5.0 x 3.9 cm. No similar appearing lesions elsewhere in the visualized bony structures. There is mild sclerosis in each sacroiliac joint region without sacroiliitis. No intramuscular or abdominal wall lesions are evident. IMPRESSION: 1. 7 x 6 mm calculus in  the lower pole left kidney. No hydronephrosis or ureteral calculus on either side. Urinary bladder wall thickness normal. 2. Splenomegaly of uncertain etiology. No focal splenic lesions evident. 3. Mixed sclerotic and lucent bone lesion involving much of the left femoral neck region. Etiology for this lesion is uncertain. Nonemergent MR of this lesion may be advisable to further evaluate. This lesion may warrant orthopedics consultation as well. 4. No bowel obstruction. No abscess in the abdomen or pelvis. Appendix appears unremarkable. 5. 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 Clinical significance of this finding uncertain. Electronically Signed   By: Bretta BangWilliam  Woodruff III M.D.   On: 05/07/2019 12:05    ____________________________________________    PROCEDURES  Procedure(s) performed:    Procedures    Medications  cefTRIAXone (ROCEPHIN) injection  1 g (1 g Intramuscular Given 05/07/19 1151)  lidocaine (PF) (XYLOCAINE) 1 % injection 5 mL (5 mLs Other Given 05/07/19 1152)     ____________________________________________   INITIAL IMPRESSION / ASSESSMENT AND PLAN / ED COURSE  Pertinent labs & imaging results that were available during my care of the patient were reviewed by me and considered in my medical decision making (see chart for details).  Review of the [place] CSRS was performed in accordance of the [facility] prior to dispensing any controlled drugs.  Patient presented to emergency department for evaluation of urinary symptoms for 2 weeks and mid back pain for 1 day.  Vital signs and exam are reassuring.  Urinalysis consistent with infection.  CT renal stone study consistent with a left kidney stone without obstruction or hydronephrosis.  Patient was given IM ceftriaxone for pyelonephritis.  CT also showed some splenomegaly of uncertain etiology and some sclerotic bone lesions to the left femur.  These findings were discussed with the patient.  Patient receives her new insurance this week and will have good access to primary care and specialty.  Patient was encouraged to follow-up with primary care next week for CT findings and reevaluation.  She was also given a referral to urology and orthopedics.  Copy of her CT scan was provided.  Patient will be discharged home with prescriptions for cefdinir.  Patient is to follow up with primary care, orthopedics, urology as directed. Patient is given ED precautions to return to the ED for any worsening or new symptoms.   Shandon L Hoare was evaluated in Emergency Department on 05/07/2019 for the symptoms described in the history of present illness. She was evaluated in the context of the global COVID-19 pandemic, which necessitated consideration that the patient might be at risk for infection with the SARS-CoV-2 virus that causes COVID-19. Institutional protocols and algorithms that pertain to the evaluation  of patients at risk for COVID-19 are in a state of rapid change based on information released by regulatory bodies including the CDC and federal and state organizations. These policies and algorithms were followed during the patient's care in the ED.  ____________________________________________  FINAL CLINICAL IMPRESSION(S) / ED DIAGNOSES  Final diagnoses:  Acute cystitis with hematuria  Nephrolithiasis  Splenomegaly  Bone lesion      NEW MEDICATIONS STARTED DURING THIS VISIT:  ED Discharge Orders         Ordered    cefdinir (OMNICEF) 300 MG capsule  2 times daily     05/07/19 1316              This chart was dictated using voice recognition software/Dragon. Despite best efforts to proofread, errors can occur which can change the meaning.  Any change was purely unintentional.    Laban Emperor, PA-C 05/07/19 1850    Earleen Newport, MD 05/10/19 [phone]

## 2019-05-07 NOTE — ED Notes (Addendum)
See triage note  Presents with lower back pain and urinary freq  States sx's started 2 weeks ago Ambulates well to treatment room

## 2019-05-07 NOTE — ED Triage Notes (Signed)
Urinary frequency, burning X 2 weeks-lower back pain. Pt alert and oriented X4, cooperative, RR even and unlabored, color WNL. Pt in NAD.

## 2019-05-07 NOTE — Discharge Instructions (Addendum)
Your urinalysis shows that you have an infection in your urine.  Please begin cefdinir today for infection.  Your CT also shows that you do have a kidney stone in your left kidney.  It is not obstructing but may be causing some discomfort.  Your CT also shows that your spleen is mildly enlarged and you also have some lesions on the bones in your left femur.  Please be sure to follow-up with primary care as soon as possible for reevaluation.  I have also given you a referral for urology for your kidney stones and orthopedics for these bone lesions.  Return to the emergency room for any worsening of symptoms.

## 2019-05-09 LAB — URINE CULTURE: Culture: >=100000 — AB

## 2019-05-10 NOTE — Progress Notes (Signed)
Brief Pharmacy Note  Patient is a 28 y/o F with history of kidney stones who presented to [facility] ED c/o urinary symptoms x 2 weeks and left mid back pain x 1 day. UA c/f infection. Patient was discharged on cefdinir for treatment of UTI. Urine culture has resulted >100k colonies/mL E coli (cephalosporin sensitive). Considering patient is appropriately covered on discharge antibiotics, will not pursue further intervention at this time.  [facility] Resident 10 May 2019

## 2019-05-17 ENCOUNTER — Other Ambulatory Visit: Payer: Self-pay | Admitting: Family Medicine

## 2019-05-17 DIAGNOSIS — N200 Calculus of kidney: Secondary | ICD-10-CM

## 2019-05-18 ENCOUNTER — Encounter: Payer: Self-pay | Admitting: Urology

## 2019-05-18 ENCOUNTER — Other Ambulatory Visit
Admission: RE | Admit: 2019-05-18 | Discharge: 2019-05-18 | Disposition: A | Discharge status: Home / Self Care | Payer: Medicaid Other | Attending: Urology | Admitting: Urology

## 2019-05-18 ENCOUNTER — Other Ambulatory Visit: Payer: Self-pay

## 2019-05-18 ENCOUNTER — Ambulatory Visit: Payer: Self-pay | Admitting: Urology

## 2019-05-18 VITALS — BP 118/74 | HR 87 | Ht 70.0 in | Wt 190.0 lb

## 2019-05-18 DIAGNOSIS — N200 Calculus of kidney: Secondary | ICD-10-CM

## 2019-05-18 LAB — URINALYSIS, COMPLETE (UACMP) WITH MICROSCOPIC
Bilirubin Urine: NEGATIVE
Glucose, UA: NEGATIVE mg/dL
Ketones, ur: NEGATIVE mg/dL
Leukocytes,Ua: NEGATIVE
Nitrite: NEGATIVE
Protein, ur: NEGATIVE mg/dL
Specific Gravity, Urine: >1.03 — ABNORMAL HIGH (ref 1.005–1.030)
WBC, UA: NONE SEEN WBC/hpf (ref 0–5)
pH: 5.5 (ref 5.0–8.0)

## 2019-05-18 NOTE — Progress Notes (Signed)
05/18/19 11:14 AM   Marlow L Economos 1991/01/19 [id number]  CC: Left renal stone  HPI: I saw Ms. Kintz in urology clinic for evaluation of a 8 mm left lower pole renal stone.  She is a 28 year old female that was seen in the emergency department on 05/07/2019 with dysuria and some left-sided flank pain.  Urine culture grew E. coli and her presentation was most consistent with pyelonephritis.  A CT was performed that showed an 8 mm left lower pole stone with no hydronephrosis.  She thinks she passed a right-sided stone a few months ago, but denies any flank pain today.  She has never had any surgery for kidney stones before.  She has a strong family history of nephrolithiasis.  She denies any gross hematuria or dysuria currently.  There are no aggravating or alleviating factors.  Severity is mild.  She denies recurrent UTIs.   PMH: Past Medical History:  Diagnosis Date  . Asthma    childhood  . Frequent headaches   . Kidney stone   . Migraine without aura     Surgical History: Past Surgical History:  Procedure Laterality Date  . DILATION AND EVACUATION N/A 07/16/2017   Procedure: DILATATION AND EVACUATION;  Surgeon: Harlin Heys, MD;  Location: [facility] ORS;  Service: Gynecology;  Laterality: N/A;    Allergies:  Allergies  Allergen Reactions  . Sulfa Antibiotics Rash    Family History: Family History  Problem Relation Age of Onset  . Mental illness Mother        bipolor/depressed  . Cervical cancer Mother   . Alcohol abuse Father     Social History:  reports that she has never smoked. She has never used smokeless tobacco. She reports that she does not drink alcohol or use drugs.  ROS: Please see flowsheet from today's date for complete review of systems.  Physical Exam: BP 118/74 (BP Location: Left Arm, Patient Position: Sitting, Cuff Size: Normal)   Pulse 87   Ht 5\' 10"  (1.778 m)   Wt 190 lb (86.2 kg)   BMI 27.26 kg/m    Constitutional:  Alert and  oriented, No acute distress. Cardiovascular: No clubbing, cyanosis, or edema. Respiratory: Normal respiratory effort, no increased work of breathing. GI: Abdomen is soft, nontender, nondistended, no abdominal masses GU: No CVA tenderness Lymph: No cervical or inguinal lymphadenopathy. Skin: No rashes, bruises or suspicious lesions. Neurologic: Grossly intact, no focal deficits, moving all 4 extremities. Psychiatric: Normal mood and affect.  Laboratory Data: Reviewed  Pertinent Imaging: I have personally reviewed the CT dated 05/07/2019.  There is an 8 mm left lower pole stone with no hydronephrosis. 800HU, 7cm SSD, clearly seen on scout CT.  Assessment & Plan:   In summary, the patient is a 28 year old female with a nonobstructive left lower pole 8 mm renal stone.  She was recently seen in the ER for a UTI and possible pyelonephritis and improved with culture appropriate antibiotics.  She is still completing her course of antibiotics and has a few days left.  We discussed various treatment options for urolithiasis including observation with or without medical expulsive therapy, shockwave lithotripsy (SWL), ureteroscopy and laser lithotripsy with stent placement, and percutaneous nephrolithotomy.  We discussed that management is based on stone size, location, density, patient co-morbidities, and patient preference.   Stones <90mm in size have a >80% spontaneous passage rate. Data surrounding the use of tamsulosin for medical expulsive therapy is controversial, but meta analyses suggests it is most efficacious for  distal stones between 5-19mm in size. Possible side effects include dizziness/lightheadedness, and retrograde ejaculation.  SWL has a lower stone free rate in a single procedure, but also a lower complication rate compared to ureteroscopy and avoids a stent and associated stent related symptoms. Possible complications include renal hematoma, steinstrasse, and need for additional  treatment.  Ureteroscopy with laser lithotripsy and stent placement has a higher stone free rate than SWL in a single procedure, however increased complication rate including possible infection, ureteral injury, bleeding, and stent related morbidity. Common stent related symptoms include dysuria, urgency/frequency, and flank pain.  We discussed that with her non-obstructive stone observation is a very reasonable option.  She would like to pursue treatment as her stone is too large to pass and she had severe pain with her prior stone episodes.  Observation, ureteroscopy, and shockwave lithotripsy are all reasonable options for her, and we discussed this at length.  After an extensive discussion of the risks and benefits of the above treatment options, the patient would like to proceed with left shockwave lithotripsy.  Schedule left shockwave lithotripsy for non-obstructive 8 mm left lower pole renal stone on 12/3 after completed antibiotics for UTI  A total of 30 minutes were spent face-to-face with the patient, greater than 50% was spent in patient education, counseling, and coordination of care regarding nephrolithiasis, UTI.   Sondra Come, MD  [facility] [address], [address] [place], [place] [address] [phone]

## 2019-05-18 NOTE — Patient Instructions (Signed)
Lithotripsy  Lithotripsy is a treatment that can sometimes help eliminate kidney stones and the pain that they cause. A form of lithotripsy, also known as extracorporeal shock wave lithotripsy, is a nonsurgical procedure that crushes a kidney stone with shock waves. These shock waves pass through your body and focus on the kidney stone. They cause the kidney stone to break up while it is still in the urinary tract. This makes it easier for the smaller pieces of stone to pass in the urine. Tell a health care provider about:  Any allergies you have.  All medicines you are taking, including vitamins, herbs, eye drops, creams, and over-the-counter medicines.  Any blood disorders you have.  Any surgeries you have had.  Any medical conditions you have.  Whether you are pregnant or may be pregnant.  Any problems you or family members have had with anesthetic medicines. What are the risks? Generally, this is a safe procedure. However, problems may occur, including:  Infection.  Bleeding of the kidney.  Bruising of the kidney or skin.  Scarring of the kidney, which can lead to: ? Increased blood pressure. ? Poor kidney function. ? Return (recurrence) of kidney stones.  Damage to other structures or organs, such as the liver, colon, spleen, or pancreas.  Blockage (obstruction) of the the tube that carries urine from the kidney to the bladder (ureter).  Failure of the kidney stone to break into pieces (fragments). What happens before the procedure? Staying hydrated Follow instructions from your health care provider about hydration, which may include:  Up to 2 hours before the procedure - you may continue to drink clear liquids, such as water, clear fruit juice, black coffee, and plain tea. Eating and drinking restrictions Follow instructions from your health care provider about eating and drinking, which may include:  8 hours before the procedure - stop eating heavy meals or foods  such as meat, fried foods, or fatty foods.  6 hours before the procedure - stop eating light meals or foods, such as toast or cereal.  6 hours before the procedure - stop drinking milk or drinks that contain milk.  2 hours before the procedure - stop drinking clear liquids. General instructions  Plan to have someone take you home from the hospital or clinic.  Ask your health care provider about: ? Changing or stopping your regular medicines. This is especially important if you are taking diabetes medicines or blood thinners. ? Taking medicines such as aspirin and ibuprofen. These medicines and other NSAIDs can thin your blood. Do not take these medicines for 7 days before your procedure if your health care provider instructs you not to.  You may have tests, such as: ? Blood tests. ? Urine tests. ? Imaging tests, such as a CT scan. What happens during the procedure?  To lower your risk of infection: ? Your health care team will wash or sanitize their hands. ? Your skin will be washed with soap.  An IV tube will be inserted into one of your veins. This tube will give you fluids and medicines.  You will be given one or more of the following: ? A medicine to help you relax (sedative). ? A medicine to make you fall asleep (general anesthetic).  A water-filled cushion may be placed behind your kidney or on your abdomen. In some cases you may be placed in a tub of lukewarm water.  Your body will be positioned in a way that makes it easy to target the kidney   stone.  A flexible tube with holes in it (stent) may be placed in the ureter. This will help keep urine flowing from the kidney if the fragments of the stone have been blocking the ureter.  An X-ray or ultrasound exam will be done to locate your stone.  Shock waves will be aimed at the stone. If you are awake, you may feel a tapping sensation as the shock waves pass through your body. The procedure may vary among health care  providers and hospitals. What happens after the procedure?  You may have an X-ray to see whether the procedure was able to break up the kidney stone and how much of the stone has passed. If large stone fragments remain after treatment, you may need to have a second procedure at a later time.  Your blood pressure, heart rate, breathing rate, and blood oxygen level will be monitored until the medicines you were given have worn off.  You may be given antibiotics or pain medicine as needed.  If a stent was placed in your ureter during surgery, it may stay in place for a few weeks.  You may need strain your urine to collect pieces of the kidney stone for testing.  You will need to drink plenty of water.  Do not drive for 24 hours if you were given a sedative. Summary  Lithotripsy is a treatment that can sometimes help eliminate kidney stones and the pain that they cause.  A form of lithotripsy, also known as extracorporeal shock wave lithotripsy, is a nonsurgical procedure that crushes a kidney stone with shock waves.  Generally, this is a safe procedure. However, problems may occur, including damage to the kidney or other organs, infection, or obstruction of the tube that carries urine from the kidney to the bladder (ureter).  When you go home, you will need to drink plenty of water. You may be asked to strain your urine to collect pieces of the kidney stone for testing. This information is not intended to replace advice given to you by your health care provider. Make sure you discuss any questions you have with your health care provider. Document Released: 06/14/2000 Document Revised: 09/28/2018 Document Reviewed: 05/08/2016 Elsevier Patient Education  2020 Elsevier Inc[employer]   Lithotripsy, Care After This sheet gives you information about how to care for yourself after your procedure. Your health care provider may also give you more specific instructions. If you have problems or questions,  contact your health care provider. What can I expect after the procedure? After the procedure, it is common to have:  Some blood in your urine. This should only last for a few days.  Soreness in your back, sides, or upper abdomen for a few days.  Blotches or bruises on your back where the pressure wave entered the skin.  Pain, discomfort, or nausea when pieces (fragments) of the kidney stone move through the tube that carries urine from the kidney to the bladder (ureter). Stone fragments may pass soon after the procedure, but they may continue to pass for up to 4-8 weeks. ? If you have severe pain or nausea, contact your health care provider. This may be caused by a large stone that was not broken up, and this may mean that you need more treatment.  Some pain or discomfort during urination.  Some pain or discomfort in the lower abdomen or (in men) at the base of the penis. Follow these instructions at home: Medicines  Take over-the-counter and prescription medicines only   as told by your health care provider.  If you were prescribed an antibiotic medicine, take it as told by your health care provider. Do not stop taking the antibiotic even if you start to feel better.  Do not drive for 24 hours if you were given a medicine to help you relax (sedative).  Do not drive or use heavy machinery while taking prescription pain medicine. Eating and drinking      Drink enough water and fluids to keep your urine clear or pale yellow. This helps any remaining pieces of the stone to pass. It can also help prevent new stones from forming.  Eat plenty of fresh fruits and vegetables.  Follow instructions from your health care provider about eating and drinking restrictions. You may be instructed: ? To reduce how much salt (sodium) you eat or drink. Check ingredients and nutrition facts on packaged foods and beverages. ? To reduce how much meat you eat.  Eat the recommended amount of calcium for  your age and gender. Ask your health care provider how much calcium you should have. General instructions  Get plenty of rest.  Most people can resume normal activities 1-2 days after the procedure. Ask your health care provider what activities are safe for you.  Your health care provider may direct you to lie in a certain position (postural drainage) and tap firmly (percuss) over your kidney area to help stone fragments pass. Follow instructions as told by your health care provider.  If directed, strain all urine through the strainer that was provided by your health care provider. ? Keep all fragments for your health care provider to see. Any stones that are found may be sent to a medical lab for examination. The stone may be as small as a grain of salt.  Keep all follow-up visits as told by your health care provider. This is important. Contact a health care provider if:  You have pain that is severe or does not get better with medicine.  You have nausea that is severe or does not go away.  You have blood in your urine longer than your health care provider told you to expect.  You have more blood in your urine.  You have pain during urination that does not go away.  You urinate more frequently than usual and this does not go away.  You develop a rash or any other possible signs of an allergic reaction. Get help right away if:  You have severe pain in your back, sides, or upper abdomen.  You have severe pain while urinating.  Your urine is very dark red.  You have blood in your stool (feces).  You cannot pass any urine at all.  You feel a strong urge to urinate after emptying your bladder.  You have a fever or chills.  You develop shortness of breath, difficulty breathing, or chest pain.  You have severe nausea that leads to persistent vomiting.  You faint. Summary  After this procedure, it is common to have some pain, discomfort, or nausea when pieces (fragments)  of the kidney stone move through the tube that carries urine from the kidney to the bladder (ureter). If this pain or nausea is severe, however, you should contact your health care provider.  Most people can resume normal activities 1-2 days after the procedure. Ask your health care provider what activities are safe for you.  Drink enough water and fluids to keep your urine clear or pale yellow. This helps any remaining   pieces of the stone to pass, and it can help prevent new stones from forming.  If directed, strain your urine and keep all fragments for your health care provider to see. Fragments or stones may be as small as a grain of salt.  Get help right away if you have severe pain in your back, sides, or upper abdomen or have severe pain while urinating. This information is not intended to replace advice given to you by your health care provider. Make sure you discuss any questions you have with your health care provider. Document Released: 07/07/2007 Document Revised: 09/28/2018 Document Reviewed: 05/08/2016 Elsevier Patient Education  2020 Elsevier Inc.   Dietary Guidelines to Help Prevent Kidney Stones Kidney stones are deposits of minerals and salts that form inside your kidneys. Your risk of developing kidney stones may be greater depending on your diet, your lifestyle, the medicines you take, and whether you have certain medical conditions. Most people can reduce their chances of developing kidney stones by following the instructions below. Depending on your overall health and the type of kidney stones you tend to develop, your dietitian may give you more specific instructions. What are tips for following this plan? Reading food labels  Choose foods with "no salt added" or "low-salt" labels. Limit your sodium intake to less than 1500 mg per day.  Choose foods with calcium for each meal and snack. Try to eat about 300 mg of calcium at each meal. Foods that contain 200-500 mg of  calcium per serving include: ? 8 oz (237 ml) of milk, fortified nondairy milk, and fortified fruit juice. ? 8 oz (237 ml) of kefir, yogurt, and soy yogurt. ? 4 oz (118 ml) of tofu. ? 1 oz of cheese. ? 1 cup (300 g) of dried figs. ? 1 cup (91 g) of cooked broccoli. ? 1-3 oz can of sardines or mackerel.  Most people need 1000 to 1500 mg of calcium each day. Talk to your dietitian about how much calcium is recommended for you. Shopping  Buy plenty of fresh fruits and vegetables. Most people do not need to avoid fruits and vegetables, even if they contain nutrients that may contribute to kidney stones.  When shopping for convenience foods, choose: ? Whole pieces of fruit. ? Premade salads with dressing on the side. ? Low-fat fruit and yogurt smoothies.  Avoid buying frozen meals or prepared deli foods.  Look for foods with live cultures, such as yogurt and kefir. Cooking  Do not add salt to food when cooking. Place a salt shaker on the table and allow each person to add his or her own salt to taste.  Use vegetable protein, such as beans, textured vegetable protein (TVP), or tofu instead of meat in pasta, casseroles, and soups. Meal planning   Eat less salt, if told by your dietitian. To do this: ? Avoid eating processed or premade food. ? Avoid eating fast food.  Eat less animal protein, including cheese, meat, poultry, or fish, if told by your dietitian. To do this: ? Limit the number of times you have meat, poultry, fish, or cheese each week. Eat a diet free of meat at least 2 days a week. ? Eat only one serving each day of meat, poultry, fish, or seafood. ? When you prepare animal protein, cut pieces into small portion sizes. For most meat and fish, one serving is about the size of one deck of cards.  Eat at least 5 servings of fresh fruits and vegetables each   day. To do this: ? Keep fruits and vegetables on hand for snacks. ? Eat 1 piece of fruit or a handful of berries with  breakfast. ? Have a salad and fruit at lunch. ? Have two kinds of vegetables at dinner.  Limit foods that are high in a substance called oxalate. These include: ? Spinach. ? Rhubarb. ? Beets. ? Potato chips and french fries. ? Nuts.  If you regularly take a diuretic medicine, make sure to eat at least 1-2 fruits or vegetables high in potassium each day. These include: ? Avocado. ? Banana. ? Orange, prune, carrot, or tomato juice. ? Baked potato. ? Cabbage. ? Beans and split peas. General instructions   Drink enough fluid to keep your urine clear or pale yellow. This is the most important thing you can do.  Talk to your health care provider and dietitian about taking daily supplements. Depending on your health and the cause of your kidney stones, you may be advised: ? Not to take supplements with vitamin C. ? To take a calcium supplement. ? To take a daily probiotic supplement. ? To take other supplements such as magnesium, fish oil, or vitamin B6.  Take all medicines and supplements as told by your health care provider.  Limit alcohol intake to no more than 1 drink a day for nonpregnant women and 2 drinks a day for men. One drink equals 12 oz of beer, 5 oz of wine, or 1 oz of hard liquor.  Lose weight if told by your health care provider. Work with your dietitian to find strategies and an eating plan that works best for you. What foods are not recommended? Limit your intake of the following foods, or as told by your dietitian. Talk to your dietitian about specific foods you should avoid based on the type of kidney stones and your overall health. Grains Breads. Bagels. Rolls. Baked goods. Salted crackers. Cereal. Pasta. Vegetables Spinach. Rhubarb. Beets. Canned vegetables. Rosita Fire. Olives. Meats and other protein foods Nuts. Nut butters. Large portions of meat, poultry, or fish. Salted or cured meats. Deli meats. Hot dogs. Sausages. Dairy Cheese. Beverages Regular soft  drinks. Regular vegetable juice. Seasonings and other foods Seasoning blends with salt. Salad dressings. Canned soups. Soy sauce. Ketchup. Barbecue sauce. Canned pasta sauce. Casseroles. Pizza. Lasagna. Frozen meals. Potato chips. [place] fries. Summary  You can reduce your risk of kidney stones by making changes to your diet.  The most important thing you can do is drink enough fluid. You should drink enough fluid to keep your urine clear or pale yellow.  Ask your health care provider or dietitian how much protein from animal sources you should eat each day, and also how much salt and calcium you should have each day. This information is not intended to replace advice given to you by your health care provider. Make sure you discuss any questions you have with your health care provider. Document Released: 10/12/2010 Document Revised: 10/07/2018 Document Reviewed: 05/28/2016 Elsevier Patient Education  2020 [employer].

## 2019-05-19 ENCOUNTER — Other Ambulatory Visit
Admission: RE | Admit: 2019-05-19 | Discharge: 2019-05-19 | Disposition: A | Discharge status: Home / Self Care | Payer: Medicaid Other | Source: Ambulatory Visit | Attending: Urology | Admitting: Urology

## 2019-05-19 ENCOUNTER — Other Ambulatory Visit: Payer: Self-pay | Admitting: Radiology

## 2019-05-19 DIAGNOSIS — N200 Calculus of kidney: Secondary | ICD-10-CM | POA: Insufficient documentation

## 2019-05-20 LAB — URINE CULTURE: Culture: NO GROWTH

## 2019-05-31 ENCOUNTER — Other Ambulatory Visit: Payer: Self-pay | Admitting: Radiology

## 2019-05-31 ENCOUNTER — Other Ambulatory Visit: Payer: Self-pay

## 2019-05-31 ENCOUNTER — Other Ambulatory Visit
Admission: RE | Admit: 2019-05-31 | Discharge: 2019-05-31 | Disposition: A | Discharge status: Home / Self Care | Payer: Medicaid Other | Source: Ambulatory Visit | Attending: Urology | Admitting: Urology

## 2019-05-31 DIAGNOSIS — N200 Calculus of kidney: Secondary | ICD-10-CM

## 2019-06-02 ENCOUNTER — Other Ambulatory Visit
Admission: RE | Admit: 2019-06-02 | Discharge: 2019-06-02 | Disposition: A | Discharge status: Home / Self Care | Payer: Medicaid Other | Source: Ambulatory Visit | Attending: Urology | Admitting: Urology

## 2019-06-02 ENCOUNTER — Other Ambulatory Visit: Payer: Self-pay

## 2019-06-02 DIAGNOSIS — U071 COVID-19: Secondary | ICD-10-CM | POA: Insufficient documentation

## 2019-06-02 DIAGNOSIS — Z01812 Encounter for preprocedural laboratory examination: Secondary | ICD-10-CM | POA: Diagnosis not present

## 2019-06-03 ENCOUNTER — Other Ambulatory Visit: Payer: Self-pay | Admitting: Radiology

## 2019-06-03 ENCOUNTER — Ambulatory Visit: Admission: RE | Admit: 2019-06-03 | Payer: Medicaid Other | Source: Home / Self Care | Admitting: Urology

## 2019-06-03 DIAGNOSIS — N200 Calculus of kidney: Secondary | ICD-10-CM

## 2019-06-03 LAB — SARS CORONAVIRUS 2 (TAT 6-24 HRS): SARS Coronavirus 2: POSITIVE — AB

## 2019-06-03 SURGERY — LITHOTRIPSY, ESWL
Anesthesia: Moderate Sedation | Laterality: Left

## 2019-06-04 ENCOUNTER — Other Ambulatory Visit: Payer: Self-pay | Admitting: Radiology

## 2019-06-09 ENCOUNTER — Telehealth: Payer: Self-pay | Admitting: Urology

## 2019-06-09 ENCOUNTER — Other Ambulatory Visit: Payer: Self-pay | Admitting: Urology

## 2019-06-09 MED ORDER — HYDROCODONE-ACETAMINOPHEN 5-325 MG PO TABS: 1.0000 | ORAL_TABLET | Freq: Four times a day (QID) | ORAL | 0 refills | Status: AC | PRN

## 2019-06-09 NOTE — Telephone Encounter (Signed)
Pt was told to call office if she needed anything for pain.  It's getting worse when she's up moving around and ibuprofen isn't working.  Her surgery is scheduled for 12/31.

## 2019-06-09 NOTE — Telephone Encounter (Signed)
sent some Norco to her [place] per sninsky. Left message on her phone

## 2019-06-15 ENCOUNTER — Ambulatory Visit: Payer: Medicaid Other | Admitting: Urology

## 2019-06-28 ENCOUNTER — Other Ambulatory Visit
Admission: RE | Admit: 2019-06-28 | Discharge: 2019-06-28 | Disposition: A | Discharge status: Home / Self Care | Payer: Self-pay | Attending: Urology | Admitting: Urology

## 2019-06-28 ENCOUNTER — Other Ambulatory Visit: Payer: Medicaid Other

## 2019-06-28 ENCOUNTER — Other Ambulatory Visit: Payer: Self-pay

## 2019-06-28 DIAGNOSIS — N200 Calculus of kidney: Secondary | ICD-10-CM | POA: Insufficient documentation

## 2019-06-28 LAB — URINALYSIS, COMPLETE (UACMP) WITH MICROSCOPIC
Bilirubin Urine: NEGATIVE
Glucose, UA: NEGATIVE mg/dL
Ketones, ur: NEGATIVE mg/dL
Leukocytes,Ua: NEGATIVE
Nitrite: NEGATIVE
Protein, ur: NEGATIVE mg/dL
Specific Gravity, Urine: >1.03 — ABNORMAL HIGH (ref 1.005–1.030)
pH: 5.5 (ref 5.0–8.0)

## 2019-06-29 LAB — URINE CULTURE: Culture: <10000 — AB

## 2019-07-01 ENCOUNTER — Encounter: Payer: Self-pay | Admitting: Urology

## 2019-07-01 ENCOUNTER — Encounter
Admission: RE | Disposition: A | Discharge status: Home / Self Care | Payer: Self-pay | Source: Home / Self Care | Attending: Urology

## 2019-07-01 ENCOUNTER — Ambulatory Visit
Admission: RE | Admit: 2019-07-01 | Discharge: 2019-07-01 | Disposition: A | Discharge status: Home / Self Care | Payer: Self-pay | Attending: Urology | Admitting: Urology

## 2019-07-01 ENCOUNTER — Ambulatory Visit: Payer: Self-pay

## 2019-07-01 DIAGNOSIS — N200 Calculus of kidney: Secondary | ICD-10-CM | POA: Insufficient documentation

## 2019-07-01 HISTORY — PX: EXTRACORPOREAL SHOCK WAVE LITHOTRIPSY: SHX1557

## 2019-07-01 LAB — POCT PREGNANCY, URINE: Preg Test, Ur: NEGATIVE

## 2019-07-01 SURGERY — LITHOTRIPSY, ESWL
Anesthesia: Moderate Sedation | Laterality: Left

## 2019-07-01 MED ORDER — DIAZEPAM 5 MG PO TABS
ORAL_TABLET | ORAL | Status: AC
  Filled 2019-07-01: qty 2

## 2019-07-01 MED ORDER — CIPROFLOXACIN HCL 500 MG PO TABS
500.0000 mg | ORAL_TABLET | ORAL | Status: AC
  Administered 2019-07-01: 07:00:00 500 mg via ORAL

## 2019-07-01 MED ORDER — DIPHENHYDRAMINE HCL 25 MG PO CAPS
25.0000 mg | ORAL_CAPSULE | ORAL | Status: AC
  Administered 2019-07-01: 25 mg via ORAL

## 2019-07-01 MED ORDER — TAMSULOSIN HCL 0.4 MG PO CAPS: 0.4000 mg | ORAL_CAPSULE | Freq: Every day | ORAL | 0 refills | Status: DC

## 2019-07-01 MED ORDER — SODIUM CHLORIDE 0.9 % IV SOLN: INTRAVENOUS | Status: DC

## 2019-07-01 MED ORDER — ONDANSETRON HCL 4 MG/2ML IJ SOLN
INTRAMUSCULAR | Status: AC
  Administered 2019-07-01: 07:00:00 4 mg via INTRAVENOUS
  Filled 2019-07-01: qty 2

## 2019-07-01 MED ORDER — DIPHENHYDRAMINE HCL 25 MG PO CAPS
ORAL_CAPSULE | ORAL | Status: AC
  Filled 2019-07-01: qty 1

## 2019-07-01 MED ORDER — HYDROCODONE-ACETAMINOPHEN 5-325 MG PO TABS: 1.0000 | ORAL_TABLET | ORAL | 0 refills | Status: AC | PRN

## 2019-07-01 MED ORDER — CIPROFLOXACIN HCL 500 MG PO TABS
ORAL_TABLET | ORAL | Status: AC
  Filled 2019-07-01: qty 1

## 2019-07-01 MED ORDER — DIAZEPAM 5 MG PO TABS
10.0000 mg | ORAL_TABLET | ORAL | Status: AC
  Administered 2019-07-01: 10 mg via ORAL

## 2019-07-01 MED ORDER — ONDANSETRON HCL 4 MG/2ML IJ SOLN: 4.0000 mg | Freq: Once | INTRAMUSCULAR | Status: AC

## 2019-07-01 NOTE — Discharge Instructions (Signed)

## 2019-07-01 NOTE — H&P (Signed)
07/01/19 7:05 AM   Teresa Ortiz 1990-09-08 [id number]   HPI: She is a 28 year old female that was seen in the emergency department on 05/07/2019 with dysuria and some left-sided flank pain.  Urine culture grew E. coli and her presentation was most consistent with pyelonephritis.  A CT was performed that showed an 8 mm left lower pole stone with no hydronephrosis.  She thinks she passed a right-sided stone a few months ago, but denies any flank pain.  She has never had any surgery for kidney stones before.  She has a strong family history of nephrolithiasis.  She denies any gross hematuria or dysuria currently.  There are no aggravating or alleviating factors.  Severity is mild.  She denies recurrent UTIs.  She desires elective treatment of her non-obstructing left lower pole stone with SWL.   PMH: Past Medical History:  Diagnosis Date  . Asthma    childhood  . Frequent headaches   . Kidney stone   . Migraine without aura     Surgical History: Past Surgical History:  Procedure Laterality Date  . DILATION AND EVACUATION N/A 07/16/2017   Procedure: DILATATION AND EVACUATION;  Surgeon: Linzie Collin, MD;  Location: [facility] ORS;  Service: Gynecology;  Laterality: N/A;    Allergies:  Allergies  Allergen Reactions  . Sulfa Antibiotics Rash    Family History: Family History  Problem Relation Age of Onset  . Mental illness Mother        bipolor/depressed  . Cervical cancer Mother   . Alcohol abuse Father     Social History:  reports that she has never smoked. She has never used smokeless tobacco. She reports that she does not drink alcohol or use drugs.  ROS: Negative aside from those stated in the HPI  Physical Exam: BP 103/65   Pulse 78   Temp 98.7 F (37.1 C) (Tympanic)   Resp 18   Ht 5\' 10"  (1.778 m)   Wt 86.2 kg   LMP 06/17/2019 (Within Days)   SpO2 100%   BMI 27.26 kg/m    Constitutional:  Alert and oriented, No acute distress. Cardiovascular:  Regular rate and rhythm Respiratory: Clear to auscultation bilaterally GI: Abdomen is soft, nontender, nondistended, no abdominal masses Lymph: No cervical or inguinal lymphadenopathy. Skin: No rashes, bruises or suspicious lesions. Neurologic: Grossly intact, no focal deficits, moving all 4 extremities. Psychiatric: Normal mood and affect.  Laboratory Data: Urine culture 12/28: <10k insignificant growth  Pertinent Imaging: I have personally reviewed the CT dated 05/07/2019.  There is an 8 mm left lower pole stone with no hydronephrosis. 800HU, 7cm SSD, clearly seen on scout CT.  Assessment & Plan:   In summary, the patient is a 28 year old female with a nonobstructive left lower pole 8 mm renal stone.   We discussed various treatment options for urolithiasis including observation with or without medical expulsive therapy, shockwave lithotripsy (SWL), ureteroscopy and laser lithotripsy with stent placement, and percutaneous nephrolithotomy.  We discussed that management is based on stone size, location, density, patient co-morbidities, and patient preference.   Stones <52mm in size have a >80% spontaneous passage rate. Data surrounding the use of tamsulosin for medical expulsive therapy is controversial, but meta analyses suggests it is most efficacious for distal stones between 5-28mm in size. Possible side effects include dizziness/lightheadedness, and retrograde ejaculation.  SWL has a lower stone free rate in a single procedure, but also a lower complication rate compared to ureteroscopy and avoids a stent and associated  stent related symptoms. Possible complications include renal hematoma, steinstrasse, and need for additional treatment.  Ureteroscopy with laser lithotripsy and stent placement has a higher stone free rate than SWL in a single procedure, however increased complication rate including possible infection, ureteral injury, bleeding, and stent related morbidity. Common  stent related symptoms include dysuria, urgency/frequency, and flank pain.  We discussed that with her non-obstructive stone observation is a very reasonable option.  She would like to pursue treatment as her stone is too large to pass and she had severe pain with her prior stone episodes.  Observation, ureteroscopy, and shockwave lithotripsy are all reasonable options for her, and we discussed this at length.  After an extensive discussion of the risks and benefits of the above treatment options, the patient would like to proceed with left shockwave lithotripsy.  B[place]ley Co, [facility] [address], [place], [place] [address] [phone]

## 2019-07-20 ENCOUNTER — Ambulatory Visit: Payer: Medicaid Other | Admitting: Urology

## 2019-08-31 ENCOUNTER — Encounter: Payer: Self-pay | Admitting: Certified Nurse Midwife

## 2019-08-31 ENCOUNTER — Other Ambulatory Visit: Payer: Self-pay

## 2019-08-31 ENCOUNTER — Ambulatory Visit (INDEPENDENT_AMBULATORY_CARE_PROVIDER_SITE_OTHER): Payer: BC Managed Care – PPO | Admitting: Certified Nurse Midwife

## 2019-08-31 VITALS — BP 102/69 | HR 75 | Ht 70.0 in | Wt 191.2 lb

## 2019-08-31 DIAGNOSIS — Z3046 Encounter for surveillance of implantable subdermal contraceptive: Secondary | ICD-10-CM | POA: Diagnosis not present

## 2019-08-31 MED ORDER — NORETHIN ACE-ETH ESTRAD-FE 1-20 MG-MCG PO TABS: 1.0000 | ORAL_TABLET | Freq: Every day | ORAL | 11 refills | Status: DC

## 2019-08-31 NOTE — Patient Instructions (Signed)
Nexplanon Instructions After Insertion  Keep bandage clean and dry for 24 hours  May use ice/Tylenol/Ibuprofen for soreness or pain  If you develop fever, drainage or increased warmth from incision site-contact office immediately   

## 2019-08-31 NOTE — Progress Notes (Signed)
Teresa Ortiz is a 29 y.o. year old [id number] Caucasian female here for Nexplanon removal .  She was given informed consent for removal . Her Nexplanon was placed 06/30/2018  No LMP recorded.,    iBP 102/69   Pulse 75   Ht 5\' 10"  (1.778 m)   Wt 191 lb 3 oz (86.7 kg)   BMI 27.43 kg/m  No LMP recorded. No results found for this or any previous visit (from the past 24 hour(s)).   Appropriate time out taken. Nexplanon site identified.  Area prepped in usual sterile fashon. Two cc's of 2% lidocaine was used to anesthetize the area. A small stab incision was made right beside the implant on the distal portion.  The Nexplanon rod was grasped using hemostats and removed intact without difficulty.  .  Steri-strips and a pressure bandage was applied.  There was less than 3 cc blood loss. There were no complications.  The patient tolerated the procedure well.  She was instructed to keep the area clean and dry, remove pressure bandage in 24 hours, and keep insertion site covered with the steri-strips for 3-5 days.  She will use the pill for [facility], she denies any contraindications for use. Orders placed to pharmacy for Junel Fe.   Follow-up PRN problems  [facility] CNM

## 2019-10-19 ENCOUNTER — Encounter: Payer: BC Managed Care – PPO | Admitting: Certified Nurse Midwife

## 2019-11-23 ENCOUNTER — Ambulatory Visit (INDEPENDENT_AMBULATORY_CARE_PROVIDER_SITE_OTHER): Payer: BC Managed Care – PPO | Admitting: Certified Nurse Midwife

## 2019-11-23 ENCOUNTER — Encounter: Payer: Self-pay | Admitting: Certified Nurse Midwife

## 2019-11-23 ENCOUNTER — Other Ambulatory Visit: Payer: Self-pay

## 2019-11-23 VITALS — BP 112/65 | HR 75 | Ht 70.0 in | Wt 190.6 lb

## 2019-11-23 DIAGNOSIS — Z87442 Personal history of urinary calculi: Secondary | ICD-10-CM

## 2019-11-23 DIAGNOSIS — M546 Pain in thoracic spine: Secondary | ICD-10-CM | POA: Diagnosis not present

## 2019-11-23 DIAGNOSIS — N926 Irregular menstruation, unspecified: Secondary | ICD-10-CM | POA: Diagnosis not present

## 2019-11-23 LAB — POCT URINE PREGNANCY: Preg Test, Ur: POSITIVE — AB

## 2019-11-23 MED ORDER — DOXYLAMINE-PYRIDOXINE 10-10 MG PO TBEC: 1.0000 | DELAYED_RELEASE_TABLET | Freq: Four times a day (QID) | ORAL | 5 refills | Status: DC

## 2019-11-23 NOTE — Patient Instructions (Signed)

## 2019-11-23 NOTE — Progress Notes (Signed)
Subjective:    Teresa Ortiz is a 29 y.o. female who presents for evaluation of amenorrhea. She believes she could be pregnant. Pregnancy is desired. Sexual Activity: single partner, contraception: none. Current symptoms also include: nausea. Last period was normal.   No LMP recorded. The following portions of the patient's history were reviewed and updated as appropriate: allergies, current medications, past family history, past medical history, past social history, past surgical history and problem list.  Review of Systems Pertinent items are noted in HPI.     Objective:    There were no vitals taken for this visit. General: alert, cooperative, appears stated age and no acute distress    Lab Review Urine HCG: positive    Assessment:    Absence of menstruation.     Plan:   Positive: EDC: . Briefly discussed pre-natal care options. Md or midwifery care discussed. She plans on being midwife pt.  Encouraged well-balanced diet, plenty of rest when needed, pre-natal vitamins daily and walking for exercise. Discussed self-help for nausea, avoiding OTC medications until consulting provider or pharmacist, other than Tylenol as needed, minimal caffeine (1-2 cups daily) and avoiding alcohol. She will schedule her initial dating u/s 1-2 wks, nurse visit @ [redacted] wks pregnant, NOB visit @ 12-[redacted] wks pregnant. Feel free to call with any questions. Diclegis ordered for nausea. Samples of PNV given.   Doreene Burke, CNM

## 2019-11-23 NOTE — Addendum Note (Signed)
Addended by: Brooke Dare on: 11/23/2019 04:22 PM   Modules accepted: Orders

## 2019-11-28 LAB — URINE CULTURE

## 2019-11-30 ENCOUNTER — Other Ambulatory Visit: Payer: Self-pay | Admitting: Certified Nurse Midwife

## 2019-11-30 MED ORDER — NITROFURANTOIN MONOHYD MACRO 100 MG PO CAPS: 100.0000 mg | ORAL_CAPSULE | Freq: Two times a day (BID) | ORAL | 0 refills | Status: AC

## 2019-11-30 NOTE — Progress Notes (Signed)
Urine culture positive for UTI orders placed for treatment.   Doreene Burke, CNM

## 2019-12-07 ENCOUNTER — Ambulatory Visit (INDEPENDENT_AMBULATORY_CARE_PROVIDER_SITE_OTHER): Payer: BC Managed Care – PPO

## 2019-12-07 ENCOUNTER — Other Ambulatory Visit: Payer: Self-pay

## 2019-12-07 DIAGNOSIS — N926 Irregular menstruation, unspecified: Secondary | ICD-10-CM | POA: Diagnosis not present

## 2019-12-20 ENCOUNTER — Encounter: Payer: Self-pay | Admitting: Certified Nurse Midwife

## 2019-12-20 ENCOUNTER — Ambulatory Visit (INDEPENDENT_AMBULATORY_CARE_PROVIDER_SITE_OTHER): Payer: BC Managed Care – PPO | Admitting: Certified Nurse Midwife

## 2019-12-20 ENCOUNTER — Other Ambulatory Visit: Payer: Self-pay

## 2019-12-20 ENCOUNTER — Other Ambulatory Visit (HOSPITAL_COMMUNITY)
Admission: RE | Admit: 2019-12-20 | Discharge: 2019-12-20 | Disposition: A | Discharge status: Home / Self Care | Payer: Medicaid Other | Source: Ambulatory Visit | Attending: Certified Nurse Midwife | Admitting: Certified Nurse Midwife

## 2019-12-20 VITALS — BP 105/69 | HR 73 | Ht 70.0 in | Wt 192.2 lb

## 2019-12-20 DIAGNOSIS — Z113 Encounter for screening for infections with a predominantly sexual mode of transmission: Secondary | ICD-10-CM

## 2019-12-20 LAB — OB RESULTS CONSOLE GC/CHLAMYDIA: Gonorrhea: NEGATIVE

## 2019-12-20 NOTE — Patient Instructions (Signed)
Vaginitis Vaginitis is a condition in which the vaginal tissue swells and becomes red (inflamed). This condition is most often caused by a change in the normal balance of bacteria and yeast that live in the vagina. This change causes an overgrowth of certain bacteria or yeast, which causes the inflammation. There are different types of vaginitis, but the most common types are:  Bacterial vaginosis.  Yeast infection (candidiasis).  Trichomoniasis vaginitis. This is a sexually transmitted disease (STD).  Viral vaginitis.  Atrophic vaginitis.  Allergic vaginitis. What are the causes? The cause of this condition depends on the type of vaginitis. It can be caused by:  Bacteria (bacterial vaginosis).  Yeast, which is a fungus (yeast infection).  A parasite (trichomoniasis vaginitis).  A virus (viral vaginitis).  Low hormone levels (atrophic vaginitis). Low hormone levels can occur during pregnancy, breastfeeding, or after menopause.  Irritants, such as bubble baths, scented tampons, and feminine sprays (allergic vaginitis). Other factors can change the normal balance of the yeast and bacteria that live in the vagina. These include:  Antibiotic medicines.  Poor hygiene.  Diaphragms, vaginal sponges, spermicides, birth control pills, and intrauterine devices (IUD).  Sex.  Infection.  Uncontrolled diabetes.  A weakened defense (immune) system. What increases the risk? This condition is more likely to develop in women who:  Smoke.  Use vaginal douches, scented tampons, or scented sanitary pads.  Wear tight-fitting pants.  Wear thong underwear.  Use oral birth control pills or an IUD.  Have sex without a condom.  Have multiple sex partners.  Have an STD.  Frequently use the spermicide nonoxynol-9.  Eat lots of foods high in sugar.  Have uncontrolled diabetes.  Have low estrogen levels.  Have a weakened immune system from an immune disorder or medical  treatment.  Are pregnant or breastfeeding. What are the signs or symptoms? Symptoms vary depending on the cause of the vaginitis. Common symptoms include:  Abnormal vaginal discharge. ? The discharge is white, gray, or yellow with bacterial vaginosis. ? The discharge is thick, white, and cheesy with a yeast infection. ? The discharge is frothy and yellow or greenish with trichomoniasis.  A bad vaginal smell. The smell is fishy with bacterial vaginosis.  Vaginal itching, pain, or swelling.  Sex that is painful.  Pain or burning when urinating. Sometimes there are no symptoms. How is this diagnosed? This condition is diagnosed based on your symptoms and medical history. A physical exam, including a pelvic exam, will also be done. You may also have other tests, including:  Tests to determine the pH level (acidity or alkalinity) of your vagina.  A whiff test, to assess the odor that results when a sample of your vaginal discharge is mixed with a potassium hydroxide solution.  Tests of vaginal fluid. A sample will be examined under a microscope. How is this treated? Treatment varies depending on the type of vaginitis you have. Your treatment may include:  Antibiotic creams or pills to treat bacterial vaginosis and trichomoniasis.  Antifungal medicines, such as vaginal creams or suppositories, to treat a yeast infection.  Medicine to ease discomfort if you have viral vaginitis. Your sexual partner should also be treated.  Estrogen delivered in a cream, pill, suppository, or vaginal ring to treat atrophic vaginitis. If vaginal dryness occurs, lubricants and moisturizing creams may help. You may need to avoid scented soaps, sprays, or douches.  Stopping use of a product that is causing allergic vaginitis. Then using a vaginal cream to treat the symptoms. Follow   these instructions at home: Lifestyle  Keep your genital area clean and dry. Avoid soap, and only rinse the area with  water.  Do not douche or use tampons until your health care provider says it is okay to do so. Use sanitary pads, if needed.  Do not have sex until your health care provider approves. When you can return to sex, practice safe sex and use condoms.  Wipe from front to back. This avoids the spread of bacteria from the rectum to the vagina. General instructions  Take over-the-counter and prescription medicines only as told by your health care provider.  If you were prescribed an antibiotic medicine, take or use it as told by your health care provider. Do not stop taking or using the antibiotic even if you start to feel better.  Keep all follow-up visits as told by your health care provider. This is important. How is this prevented?  Use mild, non-scented products. Do not use things that can irritate the vagina, such as fabric softeners. Avoid the following products if they are scented: ? Feminine sprays. ? Detergents. ? Tampons. ? Feminine hygiene products. ? Soaps or bubble baths.  Let air reach your genital area. ? Wear cotton underwear to reduce moisture buildup. ? Avoid wearing underwear while you sleep. ? Avoid wearing tight pants and underwear or nylons without a cotton panel. ? Avoid wearing thong underwear.  Take off any wet clothing, such as bathing suits, as soon as possible.  Practice safe sex and use condoms. Contact a health care provider if:  You have abdominal pain.  You have a fever.  You have symptoms that last for more than 2-3 days. Get help right away if:  You have a fever and your symptoms suddenly get worse. Summary  Vaginitis is a condition in which the vaginal tissue becomes inflamed.This condition is most often caused by a change in the normal balance of bacteria and yeast that live in the vagina.  Treatment varies depending on the type of vaginitis you have.  Do not douche, use tampons , or have sex until your health care provider approves. When  you can return to sex, practice safe sex and use condoms. This information is not intended to replace advice given to you by your health care provider. Make sure you discuss any questions you have with your health care provider. Document Revised: 05/30/2017 Document Reviewed: 07/23/2016 Elsevier Patient Education  2020 [employer]  

## 2019-12-20 NOTE — Progress Notes (Signed)
GYN ENCOUNTER NOTE  Subjective:       Teresa Ortiz is a 29 y.o. [phone] female is here for gynecologic evaluation of the following issues:  1. Increased discharge with orange color, and vaginal pain. She states that she was intimate with her partner and he has history of bumps on his genitals and has had other partners in the past. She is concerned about STD and would like to be tested today. Pt is currently [redacted] wks pregnant.      Gynecologic History Patient's last menstrual period was 10/19/2019 (exact date). Contraception: none Last Pap: 11/2015. Results were: normal Last mammogram: n/a .   Obstetric History OB History  Gravida Para Term Preterm AB Living  5 3 3   1 3   SAB TAB Ectopic Multiple Live Births  1     0 3    # Outcome Date GA Lbr Len/2nd Weight Sex Delivery Anes PTL Lv  5 Current           4 Term 05/15/18 [redacted]w[redacted]d / 03:41 6 lb 13.4 oz (3.1 kg) F Vag-Spont EPI  LIV  3 SAB 2019          2 Term 06/01/16 [redacted]w[redacted]d / 00:14 6 lb 1 oz (2.75 kg) M Vag-Spont EPI  LIV  1 Term 2011   6 lb 6.4 oz (2.903 kg) F Vag-Spont   LIV    Past Medical History:  Diagnosis Date   Asthma    childhood   Frequent headaches    Kidney stone    Migraine without aura     Past Surgical History:  Procedure Laterality Date   DILATION AND EVACUATION N/A 07/16/2017   Procedure: DILATATION AND EVACUATION;  Surgeon: Harlin Heys, MD;  Location: [facility];  Service: Gynecology;  Laterality: N/A;   EXTRACORPOREAL SHOCK WAVE LITHOTRIPSY Left 07/01/2019   Procedure: EXTRACORPOREAL SHOCK WAVE LITHOTRIPSY (ESWL);  Surgeon: Billey Co, MD;  Location: [facility];  Service: Urology;  Laterality: Left;    Current Outpatient Medications on File Prior to Visit  Medication Sig Dispense Refill   Doxylamine-Pyridoxine 10-10 MG TBEC Take 1 tablet by mouth 4 (four) times daily. Day 1 &2: 2 tablet at bedtimeDay 3 : if symptoms persists 1 tablet am; 2 tablet at bedtimeDay 4: 1 tablet am, 1 tab afternoon,  2 tab at bedtime (Patient not taking: Reported on 12/20/2019) 120 tablet 5   etonogestrel (NEXPLANON) 68 MG IMPL implant 1 each by Subdermal route once. (Patient not taking: Reported on 12/20/2019)     tamsulosin (FLOMAX) 0.4 MG CAPS capsule Take 1 capsule (0.4 mg total) by mouth daily after supper. (Patient not taking: Reported on 08/31/2019) 14 capsule 0   No current facility-administered medications on file prior to visit.    Allergies  Allergen Reactions   Sulfa Antibiotics Rash    Social History   Socioeconomic History   Marital status: Single    Spouse name: Not on file   Number of children: Not on file   Years of education: Not on file   Highest education level: Not on file  Occupational History   Not on file  Tobacco Use   Smoking status: Never Smoker   Smokeless tobacco: Never Used  Vaping Use   Vaping Use: Never used  Substance and Sexual Activity   Alcohol use: No    Alcohol/week: 0.0 standard drinks    Comment: rarely   Drug use: No   Sexual activity: Yes    Birth control/protection:  Implant  Other Topics Concern   Not on file  Social History Narrative   1 child   Lives with boyfriend and his 2 kids   Social Determinants of Health   Financial Resource Strain:    Difficulty of Paying Living Expenses:   Food Insecurity:    Worried About Programme researcher, broadcasting/film/video in the Last Year:    Barista in the Last Year:   Transportation Needs:    Freight forwarder (Medical):    Lack of Transportation (Non-Medical):   Physical Activity:    Days of Exercise per Week:    Minutes of Exercise per Session:   Stress:    Feeling of Stress :   Social Connections:    Frequency of Communication with Friends and Family:    Frequency of Social Gatherings with Friends and Family:    Attends Religious Services:    Active Member of Clubs or Organizations:    Attends Engineer, structural:    Marital Status:   Intimate Partner  Violence:    Fear of Current or Ex-Partner:    Emotionally Abused:    Physically Abused:    Sexually Abused:     Family History  Problem Relation Age of Onset   Mental illness Mother        bipolor/depressed   Cervical cancer Mother    Alcohol abuse Father     The following portions of the patient's history were reviewed and updated as appropriate: allergies, current medications, past family history, past medical history, past social history, past surgical history and problem list.  Review of Systems Review of Systems - Negative except as mentioned in HPI Review of Systems - General ROS: negative for - chills, fatigue, fever, hot flashes, malaise or night sweats Hematological and Lymphatic ROS: negative for - bleeding problems or swollen lymph nodes Gastrointestinal ROS: negative for - abdominal pain, blood in stools, change in bowel habits and nausea/vomiting Musculoskeletal ROS: negative for - joint pain, muscle pain or muscular weakness Genito-Urinary ROS: negative for - change in menstrual cycle, dysmenorrhea, dyspareunia, dysuria, genital discharge, genital ulcers, hematuria, incontinence, irregular/heavy menses, nocturia or pelvic pain. Positive for vaginal pain .   Objective:   BP 105/69    Pulse 73    Ht 5\' 10"  (1.778 m)    Wt 192 lb 3 oz (87.2 kg)    LMP 10/19/2019 (Exact Date)    BMI 27.58 kg/m  CONSTITUTIONAL: Well-developed, well-nourished female in no acute distress.  HENT:  Normocephalic, atraumatic.  NECK: Normal range of motion, supple, no masses.  Normal thyroid.  SKIN: Skin is warm and dry. No rash noted. Not diaphoretic. No erythema. No pallor. NEUROLGIC: Alert and oriented to person, place, and time. PSYCHIATRIC: Normal mood and affect. Normal behavior. Normal judgment and thought content. CARDIOVASCULAR:Not Examined RESPIRATORY: Not Examined BREASTS: Not Examined ABDOMEN: Soft, non distended; Non tender.  No Organomegaly. PELVIC:  External Genitalia:  Normal  BUS: Normal  Vagina: Normal, white discharge, no odor noted on exam  Cervix: Normal MUSCULOSKELETAL: Normal range of motion. No tenderness.  No cyanosis, clubbing, or edema.     Assessment:  Vaginal discharge Vaginal Pain   Plan:   STD testing and vaginal swab collected. Will follow up with results. Return as scheduled for NOB.   10/21/2019, CNM

## 2019-12-21 ENCOUNTER — Other Ambulatory Visit: Payer: Self-pay | Admitting: Certified Nurse Midwife

## 2019-12-21 LAB — CERVICOVAGINAL ANCILLARY ONLY
Bacterial Vaginitis (gardnerella): POSITIVE — AB
Candida Glabrata: NEGATIVE
Candida Vaginitis: NEGATIVE
Chlamydia: NEGATIVE
Comment: NEGATIVE
Comment: NEGATIVE
Comment: NEGATIVE
Comment: NEGATIVE
Comment: NEGATIVE
Comment: NORMAL
Neisseria Gonorrhea: NEGATIVE
Trichomonas: NEGATIVE

## 2019-12-21 LAB — HEPATITIS B SURFACE ANTIGEN: Hepatitis B Surface Ag: NEGATIVE

## 2019-12-21 LAB — HSV 1 AND 2 IGM ABS, INDIRECT
HSV 1 IgM: <1:10 {titer}
HSV 2 IgM: <1:10 {titer}

## 2019-12-21 LAB — RPR: RPR Ser Ql: NONREACTIVE

## 2019-12-21 LAB — HIV ANTIBODY (ROUTINE TESTING W REFLEX): HIV Screen 4th Generation wRfx: NONREACTIVE

## 2019-12-21 MED ORDER — METRONIDAZOLE 500 MG PO TABS: 500.0000 mg | ORAL_TABLET | Freq: Two times a day (BID) | ORAL | 0 refills | Status: AC

## 2019-12-30 ENCOUNTER — Other Ambulatory Visit: Payer: Self-pay

## 2019-12-30 ENCOUNTER — Ambulatory Visit (INDEPENDENT_AMBULATORY_CARE_PROVIDER_SITE_OTHER): Payer: BC Managed Care – PPO

## 2019-12-30 VITALS — BP 118/73 | HR 81 | Ht 70.0 in | Wt 194.1 lb

## 2019-12-30 DIAGNOSIS — Z3481 Encounter for supervision of other normal pregnancy, first trimester: Secondary | ICD-10-CM | POA: Diagnosis not present

## 2019-12-30 DIAGNOSIS — Z3A10 10 weeks gestation of pregnancy: Secondary | ICD-10-CM

## 2019-12-30 DIAGNOSIS — Z0283 Encounter for blood-alcohol and blood-drug test: Secondary | ICD-10-CM

## 2019-12-30 LAB — OB RESULTS CONSOLE VARICELLA ZOSTER ANTIBODY, IGG: Varicella: IMMUNE

## 2019-12-30 NOTE — Progress Notes (Signed)
      Teresa Ortiz presents for NOB nurse intake visit. Pregnancy confirmation done at [facility], 6/21/2021with Doreene Burke.  G 5.  P 3013.  LMP 10/19/2019.  EDD 07/26/2019.  Ga  10w 2d. Pregnancy education material explained and given.  0 cats in the home.  NOB labs ordered. BMI not greater than 30. TSH/HbgA1c not ordered. Sickle cell not ordered due to race. HIV and drug screen explained and ordered. Genetic screening discussed. Genetic testing; Unsure. Pt to discuss genetic testing with provider. PNV encouraged. Pt to follow up with provider in  2-3 weeks for NOB physical.  FMLA and [facility] Financial Policy reviewed with pt.

## 2019-12-30 NOTE — Patient Instructions (Signed)
WHAT OB PATIENTS CAN EXPECT   Confirmation of pregnancy and ultrasound ordered if medically indicated-[redacted] weeks gestation  New OB (NOB) intake with nurse and New OB (NOB) labs- [redacted] weeks gestation  New OB (NOB) physical examination with provider- 11/[redacted] weeks gestation  Flu vaccine-[redacted] weeks gestation  Anatomy scan-[redacted] weeks gestation  Glucose tolerance test, blood work to test for anemia, T-dap vaccine-[redacted] weeks gestation  Vaginal swabs/cultures-STD/Group B strep-[redacted] weeks gestation  Appointments every 4 weeks until 28 weeks  Every 2 weeks from 28 weeks until 36 weeks  Weekly visits from 36 weeks until delivery  Morning Sickness  Morning sickness is when you feel sick to your stomach (nauseous) during pregnancy. You may feel sick to your stomach and throw up (vomit). You may feel sick in the morning, but you can feel this way at any time of day. Some women feel very sick to their stomach and cannot stop throwing up (hyperemesis gravidarum). Follow these instructions at home: Medicines  Take over-the-counter and prescription medicines only as told by your doctor. Do not take any medicines until you talk with your doctor about them first.  Taking multivitamins before getting pregnant can stop or lessen the harshness of morning sickness. Eating and drinking  Eat dry toast or crackers before getting out of bed.  Eat 5 or 6 small meals a day.  Eat dry and bland foods like rice and baked potatoes.  Do not eat greasy, fatty, or spicy foods.  Have someone cook for you if the smell of food causes you to feel sick or throw up.  If you feel sick to your stomach after taking prenatal vitamins, take them at night or with a snack.  Eat protein when you need a snack. Nuts, yogurt, and cheese are good choices.  Drink fluids throughout the day.  Try ginger ale made with real ginger, ginger tea made from fresh grated ginger, or ginger candies. General instructions  Do not use any products  that have nicotine or tobacco in them, such as cigarettes and e-cigarettes. If you need help quitting, ask your doctor.  Use an air purifier to keep the air in your house free of smells.  Get lots of fresh air.  Try to avoid smells that make you feel sick.  Try: ? Wearing a bracelet that is used for seasickness (acupressure wristband). ? Going to a doctor who puts thin needles into certain body points (acupuncture) to improve how you feel. Contact a doctor if:  You need medicine to feel better.  You feel dizzy or light-headed.  You are losing weight. Get help right away if:  You feel very sick to your stomach and cannot stop throwing up.  You pass out (faint).  You have very bad pain in your belly. Summary  Morning sickness is when you feel sick to your stomach (nauseous) during pregnancy.  You may feel sick in the morning, but you can feel this way at any time of day.  Making some changes to what you eat may help your symptoms go away. This information is not intended to replace advice given to you by your health care provider. Make sure you discuss any questions you have with your health care provider. Document Revised: 05/30/2017 Document Reviewed: 07/18/2016 Elsevier Patient Education  2020 [employer] How a Baby Grows During Pregnancy  Pregnancy begins when a female's sperm enters a female's egg (fertilization). Fertilization usually happens in one of the tubes (fallopian tubes) that connect the ovaries to the   womb (uterus). The fertilized egg moves down the fallopian tube to the uterus. Once it reaches the uterus, it implants into the lining of the uterus and begins to grow. For the first 10 weeks, the fertilized egg is called an embryo. After 10 weeks, it is called a fetus. As the fetus continues to grow, it receives oxygen and nutrients through tissue (placenta) that grows to support the developing baby. The placenta is the life support system for the baby. It provides  oxygen and nutrition and removes waste. Learning as much as you can about your pregnancy and how your baby is developing can help you enjoy the experience. It can also make you aware of when there might be a problem and when to ask questions. How long does a typical pregnancy last? A pregnancy usually lasts 280 days, or about 40 weeks. Pregnancy is divided into three periods of growth, also called trimesters:  First trimester: 0-12 weeks.  Second trimester: 13-27 weeks.  Third trimester: 28-40 weeks. The day when your baby is ready to be born (full term) is your estimated date of delivery. How does my baby develop month by month? First month  The fertilized egg attaches to the inside of the uterus.  Some cells will form the placenta. Others will form the fetus.  The arms, legs, brain, spinal cord, lungs, and heart begin to develop.  At the end of the first month, the heart begins to beat. Second month  The bones, inner ear, eyelids, hands, and feet form.  The genitals develop.  By the end of 8 weeks, all major organs are developing. Third month  All of the internal organs are forming.  Teeth develop below the gums.  Bones and muscles begin to grow. The spine can flex.  The skin is transparent.  Fingernails and toenails begin to form.  Arms and legs continue to grow longer, and hands and feet develop.  The fetus is about 3 inches (7.6 cm) long. Fourth month  The placenta is completely formed.  The external sex organs, neck, outer ear, eyebrows, eyelids, and fingernails are formed.  The fetus can hear, swallow, and move its arms and legs.  The kidneys begin to produce urine.  The skin is covered with a white, waxy coating (vernix) and very fine hair (lanugo). Fifth month  The fetus moves around more and can be felt for the first time (quickening).  The fetus starts to sleep and wake up and may begin to suck its finger.  The nails grow to the end of the  fingers.  The organ in the digestive system that makes bile (gallbladder) functions and helps to digest nutrients.  If your baby is a girl, eggs are present in her ovaries. If your baby is a boy, testicles start to move down into his scrotum. Sixth month  The lungs are formed.  The eyes open. The brain continues to develop.  Your baby has fingerprints and toe prints. Your baby's hair grows thicker.  At the end of the second trimester, the fetus is about 9 inches (22.9 cm) long. Seventh month  The fetus kicks and stretches.  The eyes are developed enough to sense changes in light.  The hands can make a grasping motion.  The fetus responds to sound. Eighth month  All organs and body systems are fully developed and functioning.  Bones harden, and taste buds develop. The fetus may hiccup.  Certain areas of the brain are still developing. The skull remains soft.   Ninth month  The fetus gains about  lb (0.23 kg) each week.  The lungs are fully developed.  Patterns of sleep develop.  The fetus's head typically moves into a head-down position (vertex) in the uterus to prepare for birth.  The fetus weighs 6-9 lb (2.72-4.08 kg) and is 19-20 inches (48.26-50.8 cm) long. What can I do to have a healthy pregnancy and help my baby develop? General instructions  Take prenatal vitamins as directed by your health care provider. These include vitamins such as folic acid, iron, calcium, and vitamin D. They are important for healthy development.  Take medicines only as directed by your health care provider. Read labels and ask a pharmacist or your health care provider whether over-the-counter medicines, supplements, and prescription drugs are safe to take during pregnancy.  Keep all follow-up visits as directed by your health care provider. This is important. Follow-up visits include prenatal care and screening tests. How do I know if my baby is developing well? At each prenatal visit,  your health care provider will do several different tests to check on your health and keep track of your baby's development. These include:  Fundal height and position. ? Your health care provider will measure your growing belly from your pubic bone to the top of the uterus using a tape measure. ? Your health care provider will also feel your belly to determine your baby's position.  Heartbeat. ? An ultrasound in the first trimester can confirm pregnancy and show a heartbeat, depending on how far along you are. ? Your health care provider will check your baby's heart rate at every prenatal visit.  Second trimester ultrasound. ? This ultrasound checks your baby's development. It also may show your baby's gender. What should I do if I have concerns about my baby's development? Always talk with your health care provider about any concerns that you may have about your pregnancy and your baby. Summary  A pregnancy usually lasts 280 days, or about 40 weeks. Pregnancy is divided into three periods of growth, also called trimesters.  Your health care provider will monitor your baby's growth and development throughout your pregnancy.  Follow your health care provider's recommendations about taking prenatal vitamins and medicines during your pregnancy.  Talk with your health care provider if you have any concerns about your pregnancy or your developing baby. This information is not intended to replace advice given to you by your health care provider. Make sure you discuss any questions you have with your health care provider. Document Revised: 10/08/2018 Document Reviewed: 04/30/2017 Elsevier Patient Education  2020 [employer] First Trimester of Pregnancy  The first trimester of pregnancy is from week 1 until the end of week 13 (months 1 through 3). During this time, your baby will begin to develop inside you. At 6-8 weeks, the eyes and face are formed, and the heartbeat can be seen on  ultrasound. At the end of 12 weeks, all the baby's organs are formed. Prenatal care is all the medical care you receive before the birth of your baby. Make sure you get good prenatal care and follow all of your doctor's instructions. Follow these instructions at home: Medicines  Take over-the-counter and prescription medicines only as told by your doctor. Some medicines are safe and some medicines are not safe during pregnancy.  Take a prenatal vitamin that contains at least 600 micrograms (mcg) of folic acid.  If you have trouble pooping (constipation), take medicine that will make your stool soft (stool   softener) if your doctor approves. Eating and drinking   Eat regular, healthy meals.  Your doctor will tell you the amount of weight gain that is right for you.  Avoid raw meat and uncooked cheese.  If you feel sick to your stomach (nauseous) or throw up (vomit): ? Eat 4 or 5 small meals a day instead of 3 large meals. ? Try eating a few soda crackers. ? Drink liquids between meals instead of during meals.  To prevent constipation: ? Eat foods that are high in fiber, like fresh fruits and vegetables, whole grains, and beans. ? Drink enough fluids to keep your pee (urine) clear or pale yellow. Activity  Exercise only as told by your doctor. Stop exercising if you have cramps or pain in your lower belly (abdomen) or low back.  Do not exercise if it is too hot, too humid, or if you are in a place of great height (high altitude).  Try to avoid standing for long periods of time. Move your legs often if you must stand in one place for a long time.  Avoid heavy lifting.  Wear low-heeled shoes. Sit and stand up straight.  You can have sex unless your doctor tells you not to. Relieving pain and discomfort  Wear a good support bra if your breasts are sore.  Take warm water baths (sitz baths) to soothe pain or discomfort caused by hemorrhoids. Use hemorrhoid cream if your doctor says  it is okay.  Rest with your legs raised if you have leg cramps or low back pain.  If you have puffy, bulging veins (varicose veins) in your legs: ? Wear support hose or compression stockings as told by your doctor. ? Raise (elevate) your feet for 15 minutes, 3-4 times a day. ? Limit salt in your food. Prenatal care  Schedule your prenatal visits by the twelfth week of pregnancy.  Write down your questions. Take them to your prenatal visits.  Keep all your prenatal visits as told by your doctor. This is important. Safety  Wear your seat belt at all times when driving.  Make a list of emergency phone numbers. The list should include numbers for family, friends, the hospital, and police and fire departments. General instructions  Ask your doctor for a referral to a local prenatal class. Begin classes no later than at the start of month 6 of your pregnancy.  Ask for help if you need counseling or if you need help with nutrition. Your doctor can give you advice or tell you where to go for help.  Do not use hot tubs, steam rooms, or saunas.  Do not douche or use tampons or scented sanitary pads.  Do not cross your legs for long periods of time.  Avoid all herbs and alcohol. Avoid drugs that are not approved by your doctor.  Do not use any tobacco products, including cigarettes, chewing tobacco, and electronic cigarettes. If you need help quitting, ask your doctor. You may get counseling or other support to help you quit.  Avoid cat litter boxes and soil used by cats. These carry germs that can cause birth defects in the baby and can cause a loss of your baby (miscarriage) or stillbirth.  Visit your dentist. At home, brush your teeth with a soft toothbrush. Be gentle when you floss. Contact a doctor if:  You are dizzy.  You have mild cramps or pressure in your lower belly.  You have a nagging pain in your belly area.  You   continue to feel sick to your stomach, you throw up, or  you have watery poop (diarrhea).  You have a bad smelling fluid coming from your vagina.  You have pain when you pee (urinate).  You have increased puffiness (swelling) in your face, hands, legs, or ankles. Get help right away if:  You have a fever.  You are leaking fluid from your vagina.  You have spotting or bleeding from your vagina.  You have very bad belly cramping or pain.  You gain or lose weight rapidly.  You throw up blood. It may look like coffee grounds.  You are around people who have German measles, fifth disease, or chickenpox.  You have a very bad headache.  You have shortness of breath.  You have any kind of trauma, such as from a fall or a car accident. Summary  The first trimester of pregnancy is from week 1 until the end of week 13 (months 1 through 3).  To take care of yourself and your unborn baby, you will need to eat healthy meals, take medicines only if your doctor tells you to do so, and do activities that are safe for you and your baby.  Keep all follow-up visits as told by your doctor. This is important as your doctor will have to ensure that your baby is healthy and growing well. This information is not intended to replace advice given to you by your health care provider. Make sure you discuss any questions you have with your health care provider. Document Revised: 10/08/2018 Document Reviewed: 06/25/2016 Elsevier Patient Education  2020 [employer] Commonly Asked Questions During Pregnancy  Cats: A parasite can be excreted in cat feces.  To avoid exposure you need to have another person empty the little box.  If you must empty the litter box you will need to wear gloves.  Wash your hands after handling your cat.  This parasite can also be found in raw or undercooked meat so this should also be avoided.  Colds, Sore Throats, Flu: Please check your medication sheet to see what you can take for symptoms.  If your symptoms are unrelieved by these  medications please call the office.  Dental Work: Most any dental work your dentist recommends is permitted.  X-rays should only be taken during the first trimester if absolutely necessary.  Your abdomen should be shielded with a lead apron during all x-rays.  Please notify your provider prior to receiving any x-rays.  Novocaine is fine; gas is not recommended.  If your dentist requires a note from us prior to dental work please call the office and we will provide one for you.  Exercise: Exercise is an important part of staying healthy during your pregnancy.  You may continue most exercises you were accustomed to prior to pregnancy.  Later in your pregnancy you will most likely notice you have difficulty with activities requiring balance like riding a bicycle.  It is important that you listen to your body and avoid activities that put you at a higher risk of falling.  Adequate rest and staying well hydrated are a must!  If you have questions about the safety of specific activities ask your provider.    Exposure to Children with illness: Try to avoid obvious exposure; report any symptoms to us when noted,  If you have chicken pos, red measles or mumps, you should be immune to these diseases.   Please do not take any vaccines while pregnant unless you have checked   with your OB provider.  Fetal Movement: After 28 weeks we recommend you do "kick counts" twice daily.  Lie or sit down in a calm quiet environment and count your baby movements "kicks".  You should feel your baby at least 10 times per hour.  If you have not felt 10 kicks within the first hour get up, walk around and have something sweet to eat or drink then repeat for an additional hour.  If count remains less than 10 per hour notify your provider.  Fumigating: Follow your pest control agent's advice as to how long to stay out of your home.  Ventilate the area well before re-entering.  Hemorrhoids:   Most over-the-counter preparations can be used  during pregnancy.  Check your medication to see what is safe to use.  It is important to use a stool softener or fiber in your diet and to drink lots of liquids.  If hemorrhoids seem to be getting worse please call the office.   Hot Tubs:  Hot tubs Jacuzzis and saunas are not recommended while pregnant.  These increase your internal body temperature and should be avoided.  Intercourse:  Sexual intercourse is safe during pregnancy as long as you are comfortable, unless otherwise advised by your provider.  Spotting may occur after intercourse; report any bright red bleeding that is heavier than spotting.  Labor:  If you know that you are in labor, please go to the hospital.  If you are unsure, please call the office and let us help you decide what to do.  Lifting, straining, etc:  If your job requires heavy lifting or straining please check with your provider for any limitations.  Generally, you should not lift items heavier than that you can lift simply with your hands and arms (no back muscles)  Painting:  Paint fumes do not harm your pregnancy, but may make you ill and should be avoided if possible.  Latex or water based paints have less odor than oils.  Use adequate ventilation while painting.  Permanents & Hair Color:  Chemicals in hair dyes are not recommended as they cause increase hair dryness which can increase hair loss during pregnancy.  " Highlighting" and permanents are allowed.  Dye may be absorbed differently and permanents may not hold as well during pregnancy.  Sunbathing:  Use a sunscreen, as skin burns easily during pregnancy.  Drink plenty of fluids; avoid over heating.  Tanning Beds:  Because their possible side effects are still unknown, tanning beds are not recommended.  Ultrasound Scans:  Routine ultrasounds are performed at approximately 20 weeks.  You will be able to see your baby's general anatomy an if you would like to know the gender this can usually be determined as  well.  If it is questionable when you conceived you may also receive an ultrasound early in your pregnancy for dating purposes.  Otherwise ultrasound exams are not routinely performed unless there is a medical necessity.  Although you can request a scan we ask that you pay for it when conducted because insurance does not cover " patient request" scans.  Work: If your pregnancy proceeds without complications you may work until your due date, unless your physician or employer advises otherwise.  Round Ligament Pain/Pelvic Discomfort:  Sharp, shooting pains not associated with bleeding are fairly common, usually occurring in the second trimester of pregnancy.  They tend to be worse when standing up or when you remain standing for long periods of time.  These are   the result of pressure of certain pelvic ligaments called "round ligaments".  Rest, Tylenol and heat seem to be the most effective relief.  As the womb and fetus grow, they rise out of the pelvis and the discomfort improves.  Please notify the office if your pain seems different than that described.  It may represent a more serious condition.  Common Medications Safe in Pregnancy  Acne:      Constipation:  Benzoyl Peroxide     Colace  Clindamycin      Dulcolax Suppository  Topica Erythromycin     Fibercon  Salicylic Acid      Metamucil         Miralax AVOID:        Senakot   Accutane    Cough:  Retin-A       Cough Drops  Tetracycline      Phenergan w/ Codeine if Rx  Minocycline      Robitussin (Plain & DM)  Antibiotics:     Crabs/Lice:  Ceclor       RID  Cephalosporins    AVOID:  E-Mycins      Kwell  Keflex  Macrobid/Macrodantin   Diarrhea:  Penicillin      Kao-Pectate  Zithromax      Imodium AD         PUSH FLUIDS AVOID:       Cipro     Fever:  Tetracycline      Tylenol (Regular or Extra  Minocycline       Strength)  Levaquin      Extra Strength-Do not          Exceed 8 tabs/24 hrs Caffeine:        <200mg/day (equiv. To 1 cup  of coffee or  approx. 3 12 oz sodas)         Gas: Cold/Hayfever:       Gas-X  Benadryl      Mylicon  Claritin       Phazyme  **Claritin-D        Chlor-Trimeton    Headaches:  Dimetapp      ASA-Free Excedrin  Drixoral-Non-Drowsy     Cold Compress  Mucinex (Guaifenasin)     Tylenol (Regular or Extra  Sudafed/Sudafed-12 Hour     Strength)  **Sudafed PE Pseudoephedrine   Tylenol Cold & Sinus     Vicks Vapor Rub  Zyrtec  **AVOID if Problems With Blood Pressure         Heartburn: Avoid lying down for at least 1 hour after meals  Aciphex      Maalox     Rash:  Milk of Magnesia     Benadryl    Mylanta       1% Hydrocortisone Cream  Pepcid  Pepcid Complete   Sleep Aids:  Prevacid      Ambien   Prilosec       Benadryl  Rolaids       Chamomile Tea  Tums (Limit 4/day)     Unisom  Zantac       Tylenol PM         Warm milk-add vanilla or  Hemorrhoids:       Sugar for taste  Anusol/Anusol H.C.  (RX: Analapram 2.5%)  Sugar Substitutes:  Hydrocortisone OTC     Ok in moderation  Preparation H      Tucks        Vaseline lotion applied to tissue with wiping    Herpes:       Throat:  Acyclovir      Oragel  Famvir  Valtrex     Vaccines:         Flu Shot Leg Cramps:       *Gardasil  Benadryl      Hepatitis A         Hepatitis B Nasal Spray:       Pneumovax  Saline Nasal Spray     Polio Booster         Tetanus Nausea:       Tuberculosis test or PPD  Vitamin B6 25 mg TID   AVOID:    Dramamine      *Gardasil  Emetrol       Live Poliovirus  Ginger Root 250 mg QID    MMR (measles, mumps &  High Complex Carbs @ Bedtime    rebella)  Sea Bands-Accupressure    Varicella (Chickenpox)  Unisom 1/2 tab TID     *No known complications           If received before Pain:         Known pregnancy;   Darvocet       Resume series after  Lortab        Delivery  Percocet    Yeast:   Tramadol      Femstat  Tylenol  3      Gyne-lotrimin  Ultram       Monistat  Vicodin           MISC:         All Sunscreens           Hair Coloring/highlights          Insect Repellant's          (Including DEET)         Mystic Tans  

## 2019-12-31 LAB — DRUG PROFILE, UR, 9 DRUGS (LABCORP)
Amphetamines, Urine: NEGATIVE ng/mL
Barbiturate Quant, Ur: NEGATIVE ng/mL
Benzodiazepine Quant, Ur: NEGATIVE ng/mL
Cannabinoid Quant, Ur: NEGATIVE ng/mL
Cocaine (Metab.): NEGATIVE ng/mL
Methadone Screen, Urine: NEGATIVE ng/mL
Opiate Quant, Ur: NEGATIVE ng/mL
PCP Quant, Ur: NEGATIVE ng/mL
Propoxyphene: NEGATIVE ng/mL

## 2019-12-31 LAB — URINALYSIS, ROUTINE W REFLEX MICROSCOPIC
Bilirubin, UA: NEGATIVE
Glucose, UA: NEGATIVE
Ketones, UA: NEGATIVE
Leukocytes,UA: NEGATIVE
Nitrite, UA: NEGATIVE
Protein,UA: NEGATIVE
RBC, UA: NEGATIVE
Specific Gravity, UA: 1.023 (ref 1.005–1.030)
Urobilinogen, Ur: 0.2 mg/dL (ref 0.2–1.0)
pH, UA: 6 (ref 5.0–7.5)

## 2019-12-31 LAB — VARICELLA ZOSTER ANTIBODY, IGG: Varicella zoster IgG: 781 index (ref >165)

## 2019-12-31 LAB — ABO AND RH: Rh Factor: POSITIVE

## 2019-12-31 LAB — NICOTINE SCREEN, URINE: Cotinine Ql Scrn, Ur: NEGATIVE ng/mL

## 2019-12-31 LAB — RUBELLA SCREEN: Rubella Antibodies, IGG: 1.24 index (ref >0.99)

## 2019-12-31 LAB — ANTIBODY SCREEN: Antibody Screen: NEGATIVE

## 2020-01-01 LAB — CULTURE, OB URINE

## 2020-01-01 LAB — URINE CULTURE, OB REFLEX

## 2020-01-04 ENCOUNTER — Telehealth: Payer: Self-pay | Admitting: General Practice

## 2020-01-04 NOTE — Telephone Encounter (Signed)
Individual has been contacted regarding ED referral and has stated she now has insurance. No further attempts to contact individual will be made.

## 2020-01-12 ENCOUNTER — Other Ambulatory Visit: Payer: Self-pay

## 2020-01-12 ENCOUNTER — Other Ambulatory Visit (HOSPITAL_COMMUNITY)
Admission: RE | Admit: 2020-01-12 | Discharge: 2020-01-12 | Disposition: A | Discharge status: Home / Self Care | Payer: Medicaid Other | Source: Ambulatory Visit | Attending: Certified Nurse Midwife | Admitting: Certified Nurse Midwife

## 2020-01-12 ENCOUNTER — Ambulatory Visit (INDEPENDENT_AMBULATORY_CARE_PROVIDER_SITE_OTHER): Payer: BC Managed Care – PPO | Admitting: Certified Nurse Midwife

## 2020-01-12 VITALS — BP 102/69 | HR 82 | Wt 195.1 lb

## 2020-01-12 DIAGNOSIS — Z124 Encounter for screening for malignant neoplasm of cervix: Secondary | ICD-10-CM | POA: Diagnosis not present

## 2020-01-12 DIAGNOSIS — Z3A12 12 weeks gestation of pregnancy: Secondary | ICD-10-CM

## 2020-01-12 LAB — POCT URINALYSIS DIPSTICK OB
Bilirubin, UA: NEGATIVE
Blood, UA: NEGATIVE
Glucose, UA: NEGATIVE
Ketones, UA: NEGATIVE
Leukocytes, UA: NEGATIVE
Nitrite, UA: NEGATIVE
POC,PROTEIN,UA: NEGATIVE
Spec Grav, UA: 1.025 (ref 1.010–1.025)
Urobilinogen, UA: 0.2 E.U./dL
pH, UA: 6 (ref 5.0–8.0)

## 2020-01-12 NOTE — Patient Instructions (Signed)

## 2020-01-12 NOTE — Progress Notes (Signed)
NEW OB HISTORY AND PHYSICAL  SUBJECTIVE:       Teresa Ortiz is a 29 y.o. [phone] female, Patient's last menstrual period was 10/19/2019 (exact date)., Estimated Date of Delivery: 07/25/20, [redacted]w[redacted]d, presents today for establishment of Prenatal Care. She has no unusual complaints    Social In relationship with FOB ( new partner) Lives with FOB and her other children (1st baby for this FOB) Works FT-Food Doctor, hospital Exercise: none Smoke/alcohol/drugs: denies    Gynecologic History Patient's last menstrual period was 10/19/2019 (exact date). Normal Contraception: none Last Pap: 2017  Results were: normal  Obstetric History OB History  Gravida Para Term Preterm AB Living  5 3 3   1 3   SAB TAB Ectopic Multiple Live Births  1     0 3    # Outcome Date GA Lbr Len/2nd Weight Sex Delivery Anes PTL Lv  5 Current           4 Term 05/15/18 [redacted]w[redacted]d / 03:41 6 lb 13.4 oz (3.1 kg) F Vag-Spont EPI  LIV  3 SAB 2019          2 Term 06/01/16 [redacted]w[redacted]d / 00:14 6 lb 1 oz (2.75 kg) M Vag-Spont EPI  LIV  1 Term 2011   6 lb 6.4 oz (2.903 kg) F Vag-Spont   LIV    Past Medical History:  Diagnosis Date  . Asthma    childhood  . Frequent headaches   . Kidney stone   . Migraine without aura     Past Surgical History:  Procedure Laterality Date  . DILATION AND EVACUATION N/A 07/16/2017   Procedure: DILATATION AND EVACUATION;  Surgeon: 07/18/2017, MD;  Location: [facility];  Service: Gynecology;  Laterality: N/A;  . EXTRACORPOREAL SHOCK WAVE LITHOTRIPSY Left 07/01/2019   Procedure: EXTRACORPOREAL SHOCK WAVE LITHOTRIPSY (ESWL);  Surgeon: 07/03/2019, MD;  Location: [facility];  Service: Urology;  Laterality: Left;    Current Outpatient Medications on File Prior to Visit  Medication Sig Dispense Refill  . Prenatal Vit-Fe Fumarate-FA (MULTIVITAMIN-PRENATAL) 27-0.8 MG TABS tablet Take 1 tablet by mouth daily at 12 noon.     No current facility-administered medications on file prior to visit.     Allergies  Allergen Reactions  . Sulfa Antibiotics Rash    Social History   Socioeconomic History  . Marital status: Single    Spouse name: Not on file  . Number of children: Not on file  . Years of education: Not on file  . Highest education level: Not on file  Occupational History  . Not on file  Tobacco Use  . Smoking status: Never Smoker  . Smokeless tobacco: Never Used  Vaping Use  . Vaping Use: Never used  Substance and Sexual Activity  . Alcohol use: No    Alcohol/week: 0.0 standard drinks    Comment: rarely  . Drug use: No  . Sexual activity: Yes    Birth control/protection: None  Other Topics Concern  . Not on file  Social History Narrative   1 child   Lives with boyfriend and his 2 kids   Social Determinants of Health   Financial Resource Strain:   . Difficulty of Paying Living Expenses:   Food Insecurity:   . Worried About Sondra Come in the Last Year:   . Programme researcher, broadcasting/film/video in the Last Year:   Transportation Needs:   . Barista (Medical):   Freight forwarder Lack of Transportation (Non-Medical):  Physical Activity:   . Days of Exercise per Week:   . Minutes of Exercise per Session:   Stress:   . Feeling of Stress :   Social Connections:   . Frequency of Communication with Friends and Family:   . Frequency of Social Gatherings with Friends and Family:   . Attends Religious Services:   . Active Member of Clubs or Organizations:   . Attends Banker Meetings:   Marland Kitchen Marital Status:   Intimate Partner Violence:   . Fear of Current or Ex-Partner:   . Emotionally Abused:   Marland Kitchen Physically Abused:   . Sexually Abused:     Family History  Problem Relation Age of Onset  . Mental illness Mother        bipolor/depressed  . Cervical cancer Mother   . Alcohol abuse Father     The following portions of the patient's history were reviewed and updated as appropriate: allergies, current medications, past OB history, past medical  history, past surgical history, past family history, past social history, and problem list.    OBJECTIVE: Initial Physical Exam (New OB)  GENERAL APPEARANCE: alert, well appearing, in no apparent distress, oriented to person, place and time, HEAD: normocephalic, atraumatic MOUTH: mucous membranes moist, pharynx normal without lesions THYROID: no thyromegaly or masses present BREASTS: no masses noted, no significant tenderness, no palpable axillary nodes, no skin changes LUNGS: clear to auscultation, no wheezes, rales or rhonchi, symmetric air entry HEART: regular rate and rhythm, no murmurs ABDOMEN: soft, nontender, nondistended, no abnormal masses, no epigastric pain, obese and FHT present EXTREMITIES: no redness or tenderness in the calves or thighs SKIN: normal coloration and turgor, no rashes LYMPH NODES: no adenopathy palpable NEUROLOGIC: alert, oriented, normal speech, no focal findings or movement disorder noted  PELVIC EXAM EXTERNAL GENITALIA: normal appearing vulva with no masses, tenderness or lesions VAGINA: no abnormal discharge or lesions CERVIX: no lesions or cervical motion tenderness UTERUS: gravid ADNEXA: no masses palpable and nontender OB EXAM PELVIMETRY: appears adequate RECTUM: exam not indicated  ASSESSMENT: Normal pregnancy  PLAN: The patient has been given an overview regarding routine prenatal care. Recommendations regarding diet, weight gain, and exercise in pregnancy were given. Prenatal testing, optional genetic testing, carrier screening and ultrasound use in pregnancy were reviewed. Plan panorama testing will come back for lab draw.  Benefits of Breast Feeding were discussed. The patient is encouraged to consider nursing her baby post partum.  Doreene Burke, CNM

## 2020-01-13 ENCOUNTER — Other Ambulatory Visit: Payer: BC Managed Care – PPO

## 2020-01-14 LAB — CYTOLOGY - PAP: Diagnosis: NEGATIVE

## 2020-01-17 ENCOUNTER — Other Ambulatory Visit: Payer: Self-pay

## 2020-01-17 ENCOUNTER — Other Ambulatory Visit: Payer: BC Managed Care – PPO

## 2020-01-17 DIAGNOSIS — Z3A12 12 weeks gestation of pregnancy: Secondary | ICD-10-CM

## 2020-01-18 LAB — CBC
Hematocrit: 34.4 % (ref 34.0–46.6)
Hemoglobin: 11.2 g/dL (ref 11.1–15.9)
MCH: 25.6 pg — ABNORMAL LOW (ref 26.6–33.0)
MCHC: 32.6 g/dL (ref 31.5–35.7)
MCV: 79 fL (ref 79–97)
Platelets: 237 10*3/uL (ref 150–450)
RBC: 4.38 x10E6/uL (ref 3.77–5.28)
RDW: 17.9 % — ABNORMAL HIGH (ref 11.7–15.4)
WBC: 8.3 10*3/uL (ref 3.4–10.8)

## 2020-01-27 ENCOUNTER — Telehealth: Payer: Self-pay

## 2020-01-27 NOTE — Telephone Encounter (Signed)
Pt aware panorama- neg. Female.  Pt excited. States she wanted a boy!

## 2020-02-10 ENCOUNTER — Encounter: Payer: Self-pay | Admitting: Certified Nurse Midwife

## 2020-02-10 ENCOUNTER — Ambulatory Visit (INDEPENDENT_AMBULATORY_CARE_PROVIDER_SITE_OTHER): Payer: BC Managed Care – PPO | Admitting: Certified Nurse Midwife

## 2020-02-10 VITALS — BP 102/70 | HR 88 | Wt 201.0 lb

## 2020-02-10 DIAGNOSIS — Z3A16 16 weeks gestation of pregnancy: Secondary | ICD-10-CM

## 2020-02-10 DIAGNOSIS — Z3689 Encounter for other specified antenatal screening: Secondary | ICD-10-CM

## 2020-02-10 DIAGNOSIS — Z23 Encounter for immunization: Secondary | ICD-10-CM

## 2020-02-10 DIAGNOSIS — R82998 Other abnormal findings in urine: Secondary | ICD-10-CM

## 2020-02-10 DIAGNOSIS — Z3402 Encounter for supervision of normal first pregnancy, second trimester: Secondary | ICD-10-CM

## 2020-02-10 LAB — POCT URINALYSIS DIPSTICK OB
Bilirubin, UA: NEGATIVE
Blood, UA: NEGATIVE
Glucose, UA: NEGATIVE
Ketones, UA: NEGATIVE
Leukocytes, UA: NEGATIVE
POC,PROTEIN,UA: NEGATIVE
Spec Grav, UA: 1.025 (ref 1.010–1.025)
Urobilinogen, UA: 0.2 E.U./dL
pH, UA: 6 (ref 5.0–8.0)

## 2020-02-10 NOTE — Patient Instructions (Addendum)
Healthy Weight Gain During Pregnancy, Adult A certain amount of weight gain during pregnancy is normal and healthy. How much weight you should gain depends on your overall health and a measurement called BMI (body mass index). BMI is an estimate of your body fat based on your height and weight. You can use an online calculator to figure out your BMI, or you can ask your health care provider to calculate it for you at your next visit. Your recommended pregnancy weight gain is based on your pre-pregnancy BMI. General guidelines for a healthy total weight gain during pregnancy are listed below. If your BMI at or before the start of your pregnancy is:  Less than 18.5 (underweight), you should gain 28-40 lb (13-18 kg).  18.5-24.9 (normal weight), you should gain 25-35 lb (11-16 kg).  25-29.9 (overweight), you should gain 15-25 lb (7-11 kg).  30 or higher (obese), you should gain 11-20 lb (5-9 kg). These ranges vary depending on your individual health. If you are carrying more than one baby (multiples), it may be safe to gain more weight than these recommendations. If you gain less weight than recommended, that may be safe as long as your baby is growing and developing normally. How can unhealthy weight gain affect me and my baby? Gaining too much weight during pregnancy can lead to pregnancy complications, such as:  A temporary form of diabetes that develops during pregnancy (gestational diabetes).  High blood pressure during pregnancy and protein in your urine (preeclampsia).  High blood pressure during pregnancy without protein in your urine (gestational hypertension).  Your baby having a high weight at birth, which may: ? Raise your risk of having a more difficult delivery or a surgical delivery (cesarean delivery, or C-section). ? Raise your child's risk of developing obesity during childhood. Not gaining enough weight can be life-threatening for your baby, and it may raise your baby's chances  of:  Being born early (preterm).  Growing more slowly than normal during pregnancy (growth restriction).  Having a low weight at birth. What actions can I take to gain a healthy amount of weight during pregnancy? General instructions  Keep track of your weight gain during pregnancy.  Take over-the-counter and prescription medicines only as told by your health care provider. Take all prenatal supplements as directed.  Keep all health care visits during pregnancy (prenatal visits). These visits are a good time to discuss your weight gain. Your health care provider will weigh you at each visit to make sure you are gaining a healthy amount of weight. Nutrition   Eat a balanced, nutrient-rich diet. Eat plenty of: ? Fruits and vegetables, such as berries and broccoli. ? Whole grains, such as millet, barley, whole-wheat breads and cereals, and oatmeal. ? Low-fat dairy products or non-dairy products such as almond milk or rice milk. ? Protein foods, such as lean meat, chicken, eggs, and legumes (such as peas, beans, soybeans, and lentils).  Avoid foods that are fried or have a lot of fat, salt (sodium), or sugar.  Drink enough fluid to keep your urine pale yellow.  Choose healthy snack and drink options when you are at work or on the go: ? Drink water. Avoid soda, sports drinks, and juices that have added sugar. ? Avoid drinks with caffeine, such as coffee and energy drinks. ? Eat snacks that are high in protein, such as nuts, protein bars, and low-fat yogurt. ? Carry convenient snacks in your purse that do not need refrigeration, such as a pack of   trail mix, an apple, or a granola bar.  If you need help improving your diet, work with a health care provider or a diet and nutrition specialist (dietitian). Activity   Exercise regularly, as told by your health care provider. ? If you were active before becoming pregnant, you may be able to continue your regular fitness activities. ? If  you were not active before pregnancy, you may gradually build up to exercising for 30 or more minutes on most days of the week. This may include walking, swimming, or yoga.  Ask your health care provider what activities are safe for you. Talk with your health care provider about whether you may need to be excused from certain school or work activities. Where to find more information Learn more about managing your weight gain during pregnancy from:  American Pregnancy Association: www.americanpregnancy.org  [place] Department of Agriculture pregnancy weight gain calculator: https://ball-collins.biz/ Summary  Too much weight gain during pregnancy can lead to complications for you and your baby.  Find out your pre-pregnancy BMI to determine how much weight gain is healthy for you.  Eat nutritious foods and stay active.  Keep all of your prenatal visits as told by your health care provider. This information is not intended to replace advice given to you by your health care provider. Make sure you discuss any questions you have with your health care provider. Document Revised: 03/10/2019 Document Reviewed: 03/07/2017 Elsevier Patient Education  2020 [employer].   Second Trimester of Pregnancy  The second trimester is from week 14 through week 27 (month 4 through 6). This is often the time in pregnancy that you feel your best. Often times, morning sickness has lessened or quit. You may have more energy, and you may get hungry more often. Your unborn baby is growing rapidly. At the end of the sixth month, he or she is about 9 inches long and weighs about 1 pounds. You will likely feel the baby move between 18 and 20 weeks of pregnancy. Follow these instructions at home: Medicines  Take over-the-counter and prescription medicines only as told by your doctor. Some medicines are safe and some medicines are not safe during pregnancy.  Take a prenatal vitamin that contains at least 600 micrograms  (mcg) of folic acid.  If you have trouble pooping (constipation), take medicine that will make your stool soft (stool softener) if your doctor approves. Eating and drinking   Eat regular, healthy meals.  Avoid raw meat and uncooked cheese.  If you get low calcium from the food you eat, talk to your doctor about taking a daily calcium supplement.  Avoid foods that are high in fat and sugars, such as fried and sweet foods.  If you feel sick to your stomach (nauseous) or throw up (vomit): ? Eat 4 or 5 small meals a day instead of 3 large meals. ? Try eating a few soda crackers. ? Drink liquids between meals instead of during meals.  To prevent constipation: ? Eat foods that are high in fiber, like fresh fruits and vegetables, whole grains, and beans. ? Drink enough fluids to keep your pee (urine) clear or pale yellow. Activity  Exercise only as told by your doctor. Stop exercising if you start to have cramps.  Do not exercise if it is too hot, too humid, or if you are in a place of great height (high altitude).  Avoid heavy lifting.  Wear low-heeled shoes. Sit and stand up straight.  You can continue to have  sex unless your doctor tells you not to. Relieving pain and discomfort  Wear a good support bra if your breasts are tender.  Take warm water baths (sitz baths) to soothe pain or discomfort caused by hemorrhoids. Use hemorrhoid cream if your doctor approves.  Rest with your legs raised if you have leg cramps or low back pain.  If you develop puffy, bulging veins (varicose veins) in your legs: ? Wear support hose or compression stockings as told by your doctor. ? Raise (elevate) your feet for 15 minutes, 3-4 times a day. ? Limit salt in your food. Prenatal care  Write down your questions. Take them to your prenatal visits.  Keep all your prenatal visits as told by your doctor. This is important. Safety  Wear your seat belt when driving.  Make a list of emergency  phone numbers, including numbers for family, friends, the hospital, and police and fire departments. General instructions  Ask your doctor about the right foods to eat or for help finding a counselor, if you need these services.  Ask your doctor about local prenatal classes. Begin classes before month 6 of your pregnancy.  Do not use hot tubs, steam rooms, or saunas.  Do not douche or use tampons or scented sanitary pads.  Do not cross your legs for long periods of time.  Visit your dentist if you have not done so. Use a soft toothbrush to brush your teeth. Floss gently.  Avoid all smoking, herbs, and alcohol. Avoid drugs that are not approved by your doctor.  Do not use any products that contain nicotine or tobacco, such as cigarettes and e-cigarettes. If you need help quitting, ask your doctor.  Avoid cat litter boxes and soil used by cats. These carry germs that can cause birth defects in the baby and can cause a loss of your baby (miscarriage) or stillbirth. Contact a doctor if:  You have mild cramps or pressure in your lower belly.  You have pain when you pee (urinate).  You have bad smelling fluid coming from your vagina.  You continue to feel sick to your stomach (nauseous), throw up (vomit), or have watery poop (diarrhea).  You have a nagging pain in your belly area.  You feel dizzy. Get help right away if:  You have a fever.  You are leaking fluid from your vagina.  You have spotting or bleeding from your vagina.  You have severe belly cramping or pain.  You lose or gain weight rapidly.  You have trouble catching your breath and have chest pain.  You notice sudden or extreme puffiness (swelling) of your face, hands, ankles, feet, or legs.  You have not felt the baby move in over an hour.  You have severe headaches that do not go away when you take medicine.  You have trouble seeing. Summary  The second trimester is from week 14 through week 27 (months  4 through 6). This is often the time in pregnancy that you feel your best.  To take care of yourself and your unborn baby, you will need to eat healthy meals, take medicines only if your doctor tells you to do so, and do activities that are safe for you and your baby.  Call your doctor if you get sick or if you notice anything unusual about your pregnancy. Also, call your doctor if you need help with the right food to eat, or if you want to know what activities are safe for you. This information is  not intended to replace advice given to you by your health care provider. Make sure you discuss any questions you have with your health care provider. Document Revised: 10/09/2018 Document Reviewed: 07/23/2016 Elsevier Patient Education  2020 Elsevier Inc.   Round Ligament Pain  The round ligament is a cord of muscle and tissue that helps support the uterus. It can become a source of pain during pregnancy if it becomes stretched or twisted as the baby grows. The pain usually begins in the second trimester (13-28 weeks) of pregnancy, and it can come and go until the baby is delivered. It is not a serious problem, and it does not cause harm to the baby. Round ligament pain is usually a short, sharp, and pinching pain, but it can also be a dull, lingering, and aching pain. The pain is felt in the lower side of the abdomen or in the groin. It usually starts deep in the groin and moves up to the outside of the hip area. The pain may occur when you:  Suddenly change position, such as quickly going from a sitting to standing position.  Roll over in bed.  Cough or sneeze.  Do physical activity. Follow these instructions at home:   Watch your condition for any changes.  When the pain starts, relax. Then try any of these methods to help with the pain: ? Sitting down. ? Flexing your knees up to your abdomen. ? Lying on your side with one pillow under your abdomen and another pillow between your  legs. ? Sitting in a warm bath for 15-20 minutes or until the pain goes away.  Take over-the-counter and prescription medicines only as told by your health care provider.  Move slowly when you sit down or stand up.  Avoid long walks if they cause pain.  Stop or reduce your physical activities if they cause pain.  Keep all follow-up visits as told by your health care provider. This is important. Contact a health care provider if:  Your pain does not go away with treatment.  You feel pain in your back that you did not have before.  Your medicine is not helping. Get help right away if:  You have a fever or chills.  You develop uterine contractions.  You have vaginal bleeding.  You have nausea or vomiting.  You have diarrhea.  You have pain when you urinate. Summary  Round ligament pain is felt in the lower abdomen or groin. It is usually a short, sharp, and pinching pain. It can also be a dull, lingering, and aching pain.  This pain usually begins in the second trimester (13-28 weeks). It occurs because the uterus is stretching with the growing baby, and it is not harmful to the baby.  You may notice the pain when you suddenly change position, when you cough or sneeze, or during physical activity.  Relaxing, flexing your knees to your abdomen, lying on one side, or taking a warm bath may help to get rid of the pain.  Get help from your health care provider if the pain does not go away or if you have vaginal bleeding, nausea, vomiting, diarrhea, or painful urination. This information is not intended to replace advice given to you by your health care provider. Make sure you discuss any questions you have with your health care provider. Document Revised: 12/03/2017 Document Reviewed: 12/03/2017 Elsevier Patient Education  2020 Elsevier Inc.    Back Pain in Pregnancy Back pain during pregnancy is common. Back pain  may be caused by several factors that are related to  changes during your pregnancy. Follow these instructions at home: Managing pain, stiffness, and swelling      If directed, for sudden (acute) back pain, put ice on the painful area. ? Put ice in a plastic bag. ? Place a towel between your skin and the bag. ? Leave the ice on for 20 minutes, 2-3 times per day.  If directed, apply heat to the affected area before you exercise. Use the heat source that your health care provider recommends, such as a moist heat pack or a heating pad. ? Place a towel between your skin and the heat source. ? Leave the heat on for 20-30 minutes. ? Remove the heat if your skin turns bright red. This is especially important if you are unable to feel pain, heat, or cold. You may have a greater risk of getting burned.  If directed, massage the affected area. Activity  Exercise as told by your health care provider. Gentle exercise is the best way to prevent or manage back pain.  Listen to your body when lifting. If lifting hurts, ask for help or bend your knees. This uses your leg muscles instead of your back muscles.  Squat down when picking up something from the floor. Do not bend over.  Only use bed rest for short periods as told by your health care provider. Bed rest should only be used for the most severe episodes of back pain. Standing, sitting, and lying down  Do not stand in one place for long periods of time.  Use good posture when sitting. Make sure your head rests over your shoulders and is not hanging forward. Use a pillow on your lower back if necessary.  Try sleeping on your side, preferably the left side, with a pregnancy support pillow or 1-2 regular pillows between your legs. ? If you have back pain after a night's rest, your bed may be too soft. ? A firm mattress may provide more support for your back during pregnancy. General instructions  Do not wear high heels.  Eat a healthy diet. Try to gain weight within your health care provider's  recommendations.  Use a maternity girdle, elastic sling, or back brace as told by your health care provider.  Take over-the-counter and prescription medicines only as told by your health care provider.  Work with a physical therapist or massage therapist to find ways to manage back pain. Acupuncture or massage therapy may be helpful.  Keep all follow-up visits as told by your health care provider. This is important. Contact a health care provider if:  Your back pain interferes with your daily activities.  You have increasing pain in other parts of your body. Get help right away if:  You develop numbness, tingling, weakness, or problems with the use of your arms or legs.  You develop severe back pain that is not controlled with medicine.  You have a change in bowel or bladder control.  You develop shortness of breath, dizziness, or you faint.  You develop nausea, vomiting, or sweating.  You have back pain that is a rhythmic, cramping pain similar to labor pains. Labor pain is usually 1-2 minutes apart, lasts for about 1 minute, and involves a bearing down feeling or pressure in your pelvis.  You have back pain and your water breaks or you have vaginal bleeding.  You have back pain or numbness that travels down your leg.  Your back pain developed after  you fell.  You develop pain on one side of your back.  You see blood in your urine.  You develop skin blisters in the area of your back pain. Summary  Back pain may be caused by several factors that are related to changes during your pregnancy.  Follow instructions as told by your health care provider for managing pain, stiffness, and swelling.  Exercise as told by your health care provider. Gentle exercise is the best way to prevent or manage back pain.  Take over-the-counter and prescription medicines only as told by your health care provider.  Keep all follow-up visits as told by your health care provider. This is  important. This information is not intended to replace advice given to you by your health care provider. Make sure you discuss any questions you have with your health care provider. Document Revised: 10/06/2018 Document Reviewed: 12/03/2017 Elsevier Patient Education  2020 [employer].

## 2020-02-10 NOTE — Progress Notes (Signed)
[name]-Doing well, no questions or concerns. Urine culture sent due to positive nitrates, see orders. Encouraged increased protein and water intake. Anticipatory guidance regarding course of prenatal care. Reviewed red flag symptoms and when to call. RTC x 4 weeks for ANATOMY SCAN and [name] or sooner if needed.

## 2020-02-13 LAB — URINE CULTURE

## 2020-02-14 ENCOUNTER — Encounter: Payer: Self-pay | Admitting: Certified Nurse Midwife

## 2020-02-14 ENCOUNTER — Other Ambulatory Visit: Payer: Self-pay | Admitting: Certified Nurse Midwife

## 2020-02-14 DIAGNOSIS — B9620 Unspecified Escherichia coli [E. coli] as the cause of diseases classified elsewhere: Secondary | ICD-10-CM

## 2020-02-14 DIAGNOSIS — Z3A16 16 weeks gestation of pregnancy: Secondary | ICD-10-CM

## 2020-02-14 DIAGNOSIS — N390 Urinary tract infection, site not specified: Secondary | ICD-10-CM | POA: Insufficient documentation

## 2020-02-14 DIAGNOSIS — Z3402 Encounter for supervision of normal first pregnancy, second trimester: Secondary | ICD-10-CM

## 2020-02-14 MED ORDER — NITROFURANTOIN MONOHYD MACRO 100 MG PO CAPS: 100.0000 mg | ORAL_CAPSULE | Freq: Two times a day (BID) | ORAL | 0 refills | Status: DC

## 2020-03-07 ENCOUNTER — Ambulatory Visit (INDEPENDENT_AMBULATORY_CARE_PROVIDER_SITE_OTHER): Payer: BC Managed Care – PPO

## 2020-03-07 ENCOUNTER — Other Ambulatory Visit: Payer: Self-pay | Admitting: Certified Nurse Midwife

## 2020-03-07 ENCOUNTER — Encounter: Payer: Self-pay | Admitting: Certified Nurse Midwife

## 2020-03-07 ENCOUNTER — Ambulatory Visit (INDEPENDENT_AMBULATORY_CARE_PROVIDER_SITE_OTHER): Payer: BC Managed Care – PPO | Admitting: Certified Nurse Midwife

## 2020-03-07 ENCOUNTER — Other Ambulatory Visit: Payer: Self-pay

## 2020-03-07 VITALS — BP 103/65 | HR 83 | Wt 204.2 lb

## 2020-03-07 DIAGNOSIS — Z3402 Encounter for supervision of normal first pregnancy, second trimester: Secondary | ICD-10-CM | POA: Diagnosis not present

## 2020-03-07 DIAGNOSIS — Z3689 Encounter for other specified antenatal screening: Secondary | ICD-10-CM | POA: Diagnosis not present

## 2020-03-07 DIAGNOSIS — Z3482 Encounter for supervision of other normal pregnancy, second trimester: Secondary | ICD-10-CM

## 2020-03-07 DIAGNOSIS — Z3A20 20 weeks gestation of pregnancy: Secondary | ICD-10-CM

## 2020-03-07 DIAGNOSIS — Z3A16 16 weeks gestation of pregnancy: Secondary | ICD-10-CM

## 2020-03-07 LAB — POCT URINALYSIS DIPSTICK OB
Bilirubin, UA: NEGATIVE
Blood, UA: NEGATIVE
Glucose, UA: NEGATIVE
Ketones, UA: NEGATIVE
Leukocytes, UA: NEGATIVE
Nitrite, UA: NEGATIVE
POC,PROTEIN,UA: NEGATIVE
Spec Grav, UA: 1.015 (ref 1.010–1.025)
Urobilinogen, UA: 0.2 E.U./dL
pH, UA: 7.5 (ref 5.0–8.0)

## 2020-03-07 NOTE — Patient Instructions (Signed)

## 2020-03-07 NOTE — Progress Notes (Signed)
[name] doing well. Feels good movement. Anatomy u/s today, results reviewed. Need follow up 2 wks for completion. Pt c/o wrist and hand pain . 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 She verbalizes and agrees Also, she has additional complaints of acid reflux, referred to med list. Follow up 2 wks for u/s , 4 wk rob with Marcelino Duster.   Doreene Burke, CNM  Patient Name: REDONNA WILBERT DOB: 07-09-1990 MRN: [id number] ULTRASOUND REPORT  Location: [facility] Date of Service: 03/07/2020   Indications:Anatomy Ultrasound   Findings:  Mason Jim intrauterine pregnancy is visualized with FHR at 146 BPM. Biometrics give an (U/S) Gestational age of [redacted]w[redacted]d and an (U/S) EDD of 07/15/2020; this correlates with the clinically established Estimated Date of Delivery: 07/25/20  Fetal presentation is Cephalic.  EFW: 408 g ( 14 oz). Fetal Percentile  Placenta: posterior. Grade: 1 AFI: subjectively normal.  Anatomic survey is Nose/Lip, Face, Profile and normal; Gender - female.    Right Ovary is normal in appearance. Left Ovary is normal appearance. Survey of the adnexa demonstrates no adnexal masses. There is no free peritoneal fluid in the cul de sac.  Impression: 1. [redacted]w[redacted]d Viable Singleton Intrauterine pregnancy by U/S. 2. (U/S) EDD is consistent with Clinically established Estimated Date of Delivery: 07/25/20 . 3. Incomplete for Nose/Lip, Face, Profile  Recommendations: 1.Clinical correlation with the patient's History and Physical Exam.   Jenine M. Marciano Sequin    RDMS

## 2020-03-09 LAB — URINE CULTURE, OB REFLEX

## 2020-03-09 LAB — CULTURE, OB URINE

## 2020-03-21 ENCOUNTER — Ambulatory Visit (INDEPENDENT_AMBULATORY_CARE_PROVIDER_SITE_OTHER): Payer: BC Managed Care – PPO

## 2020-03-21 ENCOUNTER — Other Ambulatory Visit: Payer: Self-pay | Admitting: Certified Nurse Midwife

## 2020-03-21 ENCOUNTER — Other Ambulatory Visit: Payer: Self-pay

## 2020-03-21 DIAGNOSIS — Z09 Encounter for follow-up examination after completed treatment for conditions other than malignant neoplasm: Secondary | ICD-10-CM

## 2020-03-21 DIAGNOSIS — Z3A22 22 weeks gestation of pregnancy: Secondary | ICD-10-CM | POA: Diagnosis not present

## 2020-03-27 ENCOUNTER — Other Ambulatory Visit: Payer: Self-pay | Admitting: Certified Nurse Midwife

## 2020-03-27 ENCOUNTER — Observation Stay
Admission: EM | Admit: 2020-03-27 | Discharge: 2020-03-27 | Disposition: A | Discharge status: Home / Self Care | Payer: BC Managed Care – PPO | Attending: Certified Nurse Midwife | Admitting: Certified Nurse Midwife

## 2020-03-27 DIAGNOSIS — R109 Unspecified abdominal pain: Secondary | ICD-10-CM | POA: Diagnosis not present

## 2020-03-27 DIAGNOSIS — Z3A22 22 weeks gestation of pregnancy: Secondary | ICD-10-CM | POA: Diagnosis not present

## 2020-03-27 DIAGNOSIS — Z3490 Encounter for supervision of normal pregnancy, unspecified, unspecified trimester: Secondary | ICD-10-CM

## 2020-03-27 DIAGNOSIS — O26892 Other specified pregnancy related conditions, second trimester: Secondary | ICD-10-CM | POA: Diagnosis not present

## 2020-03-27 DIAGNOSIS — Z87442 Personal history of urinary calculi: Secondary | ICD-10-CM

## 2020-03-27 DIAGNOSIS — O26899 Other specified pregnancy related conditions, unspecified trimester: Secondary | ICD-10-CM

## 2020-03-27 LAB — URINALYSIS, ROUTINE W REFLEX MICROSCOPIC
Bilirubin Urine: NEGATIVE
Glucose, UA: NEGATIVE mg/dL
Hgb urine dipstick: NEGATIVE
Ketones, ur: NEGATIVE mg/dL
Leukocytes,Ua: NEGATIVE
Nitrite: NEGATIVE
Protein, ur: NEGATIVE mg/dL
Specific Gravity, Urine: 1.023 (ref 1.005–1.030)
pH: 6 (ref 5.0–8.0)

## 2020-03-27 NOTE — OB Triage Note (Signed)
Pt is G5P3 at 22.6weeks with c/o left flank pain x 1 week. Worsening over the last few days. Has a x of UTIs with abx about 6 weeks ago. Has had kidney stones in the past. Pt 5/10 with tenderness to left flank. Pt states feels like bricks. No OB complaints

## 2020-03-27 NOTE — OB Triage Note (Addendum)
    L&D OB Triage Note  SUBJECTIVE Teresa Ortiz is a 29 y.o. [id number] female at [redacted]w[redacted]d, EDD Estimated Date of Delivery: 07/25/20 who presented to triage with complaints of mild left sided flank pain . She denies contractions, feels good fetal movement, denies vaginal bleeding, or LOF. She denies fever and has not taken anything for pain. She has a history of UTI and kidney stones.  Per RN, pt appears comfortable and is talking with ease.   OB History  Gravida Para Term Preterm AB Living  5 3 3  0 1 3  SAB TAB Ectopic Multiple Live Births  1 0 0 0 3    # Outcome Date GA Lbr Len/2nd Weight Sex Delivery Anes PTL Lv  5 Current           4 Term 05/15/18 [redacted]w[redacted]d / 03:41 3100 g F Vag-Spont EPI  LIV     Name: Shevlin,GIRL Shantale     Apgar1: 8  Apgar5: 9  3 SAB 2019          2 Term 06/01/16 [redacted]w[redacted]d / 00:14 2750 g M Vag-Spont EPI  LIV     Name: MARIAHA, ELLINGTON     Apgar1: 8  Apgar5: 9  1 Term 2011   2903 g F Vag-Spont   LIV    Medications Prior to Admission  Medication Sig Dispense Refill Last Dose  . nitrofurantoin, macrocrystal-monohydrate, (MACROBID) 100 MG capsule Take 1 capsule (100 mg total) by mouth 2 (two) times daily. (Patient not taking: Reported on 03/07/2020) 14 capsule 0   . Prenatal Vit-Fe Fumarate-FA (MULTIVITAMIN-PRENATAL) 27-0.8 MG TABS tablet Take 1 tablet by mouth daily at 12 noon.        OBJECTIVE  Nursing Evaluation:   BP 119/64   Pulse 97   Temp 98 F (36.7 C)   Resp 16   LMP 10/19/2019 (Exact Date)    Findings:        UA negative      NST was performed and has been reviewed by me. FHT's appropriate for gestational age  NST INTERPRETATION: Category I  Mode: External Baseline Rate (A): 155 bpm   Contraction Frequency (min): denies  ASSESSMENT Impression:  1.  Pregnancy:  10/21/2019 at [redacted]w[redacted]d , EDD Estimated Date of Delivery: 07/25/20 2.  Reassuring fetal and maternal status 3.  UA negative, urine culture sent  PLAN 1. Discussed current condition and  above findings with patient and reassurance given.  All questions answered.Given the patient is stable , afebrile, U/A is negative pt given option to have renal u/s done outpatient. She is in agreement and is comfortable with plan of care per RN.  Renal ultrasound ordered outpatient. Encourage tylenol, PO hydration, and heating pad.  2. Discharge home with standard labor precautions and red flag symptoms given to return to L&D or call the office for problems. 3. Continue routine prenatal care.  07/27/20, CNM

## 2020-03-27 NOTE — Discharge Instructions (Signed)
Call office in the morning for a visit with Teresa Ortiz and to set up Ultrasound of [place]

## 2020-03-28 ENCOUNTER — Other Ambulatory Visit: Payer: Self-pay

## 2020-03-28 ENCOUNTER — Telehealth: Payer: Self-pay

## 2020-03-28 ENCOUNTER — Ambulatory Visit
Admission: RE | Admit: 2020-03-28 | Discharge: 2020-03-28 | Disposition: A | Discharge status: Home / Self Care | Payer: BC Managed Care – PPO | Source: Ambulatory Visit | Attending: Certified Nurse Midwife | Admitting: Certified Nurse Midwife

## 2020-03-28 DIAGNOSIS — Z87442 Personal history of urinary calculi: Secondary | ICD-10-CM | POA: Diagnosis present

## 2020-03-28 DIAGNOSIS — Z3A22 22 weeks gestation of pregnancy: Secondary | ICD-10-CM

## 2020-03-28 DIAGNOSIS — R109 Unspecified abdominal pain: Secondary | ICD-10-CM | POA: Insufficient documentation

## 2020-03-28 DIAGNOSIS — O26899 Other specified pregnancy related conditions, unspecified trimester: Secondary | ICD-10-CM | POA: Diagnosis present

## 2020-03-28 LAB — CULTURE, OB URINE

## 2020-03-28 NOTE — Telephone Encounter (Signed)
mychart message sent to patient

## 2020-03-29 ENCOUNTER — Other Ambulatory Visit: Payer: BC Managed Care – PPO

## 2020-03-29 ENCOUNTER — Telehealth: Payer: Self-pay

## 2020-03-29 DIAGNOSIS — O26899 Other specified pregnancy related conditions, unspecified trimester: Secondary | ICD-10-CM

## 2020-03-29 NOTE — Telephone Encounter (Signed)
Called patient- she reports she is still uncomfortable. States she rested all day yesterday and it seems to bother her more when she is up and about. She is also taking frequent breaks at work. Informed of the need to come to office and leave a urine specimen. States she will be here today between 3 and 330.

## 2020-04-03 LAB — URINE CULTURE

## 2020-04-05 ENCOUNTER — Other Ambulatory Visit: Payer: Self-pay | Admitting: Certified Nurse Midwife

## 2020-04-05 DIAGNOSIS — O99891 Other specified diseases and conditions complicating pregnancy: Secondary | ICD-10-CM

## 2020-04-06 ENCOUNTER — Other Ambulatory Visit: Payer: Self-pay

## 2020-04-06 ENCOUNTER — Encounter: Payer: Self-pay | Admitting: Certified Nurse Midwife

## 2020-04-06 ENCOUNTER — Ambulatory Visit (INDEPENDENT_AMBULATORY_CARE_PROVIDER_SITE_OTHER): Payer: BC Managed Care – PPO | Admitting: Certified Nurse Midwife

## 2020-04-06 VITALS — BP 106/70 | HR 98 | Wt 207.9 lb

## 2020-04-06 DIAGNOSIS — Z3A24 24 weeks gestation of pregnancy: Secondary | ICD-10-CM

## 2020-04-06 DIAGNOSIS — M5432 Sciatica, left side: Secondary | ICD-10-CM

## 2020-04-06 DIAGNOSIS — Z3482 Encounter for supervision of other normal pregnancy, second trimester: Secondary | ICD-10-CM

## 2020-04-06 LAB — POCT URINALYSIS DIPSTICK OB
Bilirubin, UA: NEGATIVE
Blood, UA: NEGATIVE
Glucose, UA: NEGATIVE
Ketones, UA: NEGATIVE
Nitrite, UA: NEGATIVE
POC,PROTEIN,UA: NEGATIVE
Spec Grav, UA: 1.015 (ref 1.010–1.025)
Urobilinogen, UA: 0.2 E.U./dL
pH, UA: 6.5 (ref 5.0–8.0)

## 2020-04-06 NOTE — Patient Instructions (Addendum)
Sciatica  Sciatica is pain, weakness, tingling, or loss of feeling (numbness) along the sciatic nerve. The sciatic nerve starts in the lower back and goes down the back of each leg. Sciatica usually goes away on its own or with treatment. Sometimes, sciatica may come back (recur). What are the causes? This condition happens when the sciatic nerve is pinched or has pressure put on it. This may be the result of:  A disk in between the bones of the spine bulging out too far (herniated disk).  Changes in the spinal disks that occur with aging.  A condition that affects a muscle in the butt.  Extra bone growth near the sciatic nerve.  A break (fracture) of the area between your hip bones (pelvis).  Pregnancy.  Tumor. This is rare. What increases the risk? You are more likely to develop this condition if you:  Play sports that put pressure or stress on the spine.  Have poor strength and ease of movement (flexibility).  Have had a back injury in the past.  Have had back surgery.  Sit for long periods of time.  Do activities that involve bending or lifting over and over again.  Are very overweight (obese). What are the signs or symptoms? Symptoms can vary from mild to very bad. They may include:  Any of these problems in the lower back, leg, hip, or butt: ? Mild tingling, loss of feeling, or dull aches. ? Burning sensations. ? Sharp pains.  Loss of feeling in the back of the calf or the sole of the foot.  Leg weakness.  Very bad back pain that makes it hard to move. These symptoms may get worse when you cough, sneeze, or laugh. They may also get worse when you sit or stand for long periods of time. How is this treated? This condition often gets better without any treatment. However, treatment may include:  Changing or cutting back on physical activity when you have pain.  Doing exercises and stretching.  Putting ice or heat on the affected area.  Medicines that  help: ? To relieve pain and swelling. ? To relax your muscles.  Shots (injections) of medicines that help to relieve pain, irritation, and swelling.  Surgery. Follow these instructions at home: Medicines  Take over-the-counter and prescription medicines only as told by your doctor.  Ask your doctor if the medicine prescribed to you: ? Requires you to avoid driving or using heavy machinery. ? Can cause trouble pooping (constipation). You may need to take these steps to prevent or treat trouble pooping:  Drink enough fluids to keep your pee (urine) pale yellow.  Take over-the-counter or prescription medicines.  Eat foods that are high in fiber. These include beans, whole grains, and fresh fruits and vegetables.  Limit foods that are high in fat and sugar. These include fried or sweet foods. Managing pain      If told, put ice on the affected area. ? Put ice in a plastic bag. ? Place a towel between your skin and the bag. ? Leave the ice on for 20 minutes, 2-3 times a day.  If told, put heat on the affected area. Use the heat source that your doctor tells you to use, such as a moist heat pack or a heating pad. ? Place a towel between your skin and the heat source. ? Leave the heat on for 20-30 minutes. ? Remove the heat if your skin turns bright red. This is very important if you are   unable to feel pain, heat, or cold. You may have a greater risk of getting burned. Activity   Return to your normal activities as told by your doctor. Ask your doctor what activities are safe for you.  Avoid activities that make your symptoms worse.  Take short rests during the day. ? When you rest for a long time, do some physical activity or stretching between periods of rest. ? Avoid sitting for a long time without moving. Get up and move around at least one time each hour.  Exercise and stretch regularly, as told by your doctor.  Do not lift anything that is heavier than 10 lb (4.5 kg)  while you have symptoms of sciatica. ? Avoid lifting heavy things even when you do not have symptoms. ? Avoid lifting heavy things over and over.  When you lift objects, always lift in a way that is safe for your body. To do this, you should: ? Bend your knees. ? Keep the object close to your body. ? Avoid twisting. General instructions  Stay at a healthy weight.  Wear comfortable shoes that support your feet. Avoid wearing high heels.  Avoid sleeping on a mattress that is too soft or too hard. You might have less pain if you sleep on a mattress that is firm enough to support your back.  Keep all follow-up visits as told by your doctor. This is important. Contact a doctor if:  You have pain that: ? Wakes you up when you are sleeping. ? Gets worse when you lie down. ? Is worse than the pain you have had in the past. ? Lasts longer than 4 weeks.  You lose weight without trying. Get help right away if:  You cannot control when you pee (urinate) or poop (have a bowel movement).  You have weakness in any of these areas and it gets worse: ? Lower back. ? The area between your hip bones. ? Butt. ? Legs.  You have redness or swelling of your back.  You have a burning feeling when you pee. Summary  Sciatica is pain, weakness, tingling, or loss of feeling (numbness) along the sciatic nerve.  This condition happens when the sciatic nerve is pinched or has pressure put on it.  Sciatica can cause pain, tingling, or loss of feeling (numbness) in the lower back, legs, hips, and butt.  Treatment often includes rest, exercise, medicines, and putting ice or heat on the affected area. This information is not intended to replace advice given to you by your health care provider. Make sure you discuss any questions you have with your health care provider. Document Revised: 07/06/2018 Document Reviewed: 07/06/2018 Elsevier Patient Education  2020 [employer]   WHAT OB PATIENTS CAN  EXPECT   Confirmation of pregnancy and ultrasound ordered if medically indicated-[redacted] weeks gestation  New OB (NOB) intake with nurse and New OB (NOB) labs- [redacted] weeks gestation  New OB (NOB) physical examination with provider- 11/[redacted] weeks gestation  Flu vaccine-[redacted] weeks gestation  Anatomy scan-[redacted] weeks gestation  Glucose tolerance test, blood work to test for anemia, T-dap vaccine-[redacted] weeks gestation  Vaginal swabs/cultures-STD/Group B strep-[redacted] weeks gestation  Appointments every 4 weeks until 28 weeks  Every 2 weeks from 28 weeks until 36 weeks  Weekly visits from 36 weeks until delivery     Round Ligament Pain  The round ligament is a cord of muscle and tissue that helps support the uterus. It can become a source of pain during pregnancy if  it becomes stretched or twisted as the baby grows. The pain usually begins in the second trimester (13-28 weeks) of pregnancy, and it can come and go until the baby is delivered. It is not a serious problem, and it does not cause harm to the baby. Round ligament pain is usually a short, sharp, and pinching pain, but it can also be a dull, lingering, and aching pain. The pain is felt in the lower side of the abdomen or in the groin. It usually starts deep in the groin and moves up to the outside of the hip area. The pain may occur when you:  Suddenly change position, such as quickly going from a sitting to standing position.  Roll over in bed.  Cough or sneeze.  Do physical activity. Follow these instructions at home:   Watch your condition for any changes.  When the pain starts, relax. Then try any of these methods to help with the pain: ? Sitting down. ? Flexing your knees up to your abdomen. ? Lying on your side with one pillow under your abdomen and another pillow between your legs. ? Sitting in a warm bath for 15-20 minutes or until the pain goes away.  Take over-the-counter and prescription medicines only as told by your health  care provider.  Move slowly when you sit down or stand up.  Avoid long walks if they cause pain.  Stop or reduce your physical activities if they cause pain.  Keep all follow-up visits as told by your health care provider. This is important. Contact a health care provider if:  Your pain does not go away with treatment.  You feel pain in your back that you did not have before.  Your medicine is not helping. Get help right away if:  You have a fever or chills.  You develop uterine contractions.  You have vaginal bleeding.  You have nausea or vomiting.  You have diarrhea.  You have pain when you urinate. Summary  Round ligament pain is felt in the lower abdomen or groin. It is usually a short, sharp, and pinching pain. It can also be a dull, lingering, and aching pain.  This pain usually begins in the second trimester (13-28 weeks). It occurs because the uterus is stretching with the growing baby, and it is not harmful to the baby.  You may notice the pain when you suddenly change position, when you cough or sneeze, or during physical activity.  Relaxing, flexing your knees to your abdomen, lying on one side, or taking a warm bath may help to get rid of the pain.  Get help from your health care provider if the pain does not go away or if you have vaginal bleeding, nausea, vomiting, diarrhea, or painful urination. This information is not intended to replace advice given to you by your health care provider. Make sure you discuss any questions you have with your health care provider. Document Revised: 12/03/2017 Document Reviewed: 12/03/2017 Elsevier Patient Education  2020 [employer] Inc.   Back Pain in Pregnancy Back pain during pregnancy is common. Back pain may be caused by several factors that are related to changes during your pregnancy. Follow these instructions at home: Managing pain, stiffness, and swelling      If directed, for sudden (acute) back pain, put ice  on the painful area. ? Put ice in a plastic bag. ? Place a towel between your skin and the bag. ? Leave the ice on for 20 minutes, 2-3 times per  day.  If directed, apply heat to the affected area before you exercise. Use the heat source that your health care provider recommends, such as a moist heat pack or a heating pad. ? Place a towel between your skin and the heat source. ? Leave the heat on for 20-30 minutes. ? Remove the heat if your skin turns bright red. This is especially important if you are unable to feel pain, heat, or cold. You may have a greater risk of getting burned.  If directed, massage the affected area. Activity  Exercise as told by your health care provider. Gentle exercise is the best way to prevent or manage back pain.  Listen to your body when lifting. If lifting hurts, ask for help or bend your knees. This uses your leg muscles instead of your back muscles.  Squat down when picking up something from the floor. Do not bend over.  Only use bed rest for short periods as told by your health care provider. Bed rest should only be used for the most severe episodes of back pain. Standing, sitting, and lying down  Do not stand in one place for long periods of time.  Use good posture when sitting. Make sure your head rests over your shoulders and is not hanging forward. Use a pillow on your lower back if necessary.  Try sleeping on your side, preferably the left side, with a pregnancy support pillow or 1-2 regular pillows between your legs. ? If you have back pain after a night's rest, your bed may be too soft. ? A firm mattress may provide more support for your back during pregnancy. General instructions  Do not wear high heels.  Eat a healthy diet. Try to gain weight within your health care provider's recommendations.  Use a maternity girdle, elastic sling, or back brace as told by your health care provider.  Take over-the-counter and prescription medicines only  as told by your health care provider.  Work with a physical therapist or massage therapist to find ways to manage back pain. Acupuncture or massage therapy may be helpful.  Keep all follow-up visits as told by your health care provider. This is important. Contact a health care provider if:  Your back pain interferes with your daily activities.  You have increasing pain in other parts of your body. Get help right away if:  You develop numbness, tingling, weakness, or problems with the use of your arms or legs.  You develop severe back pain that is not controlled with medicine.  You have a change in bowel or bladder control.  You develop shortness of breath, dizziness, or you faint.  You develop nausea, vomiting, or sweating.  You have back pain that is a rhythmic, cramping pain similar to labor pains. Labor pain is usually 1-2 minutes apart, lasts for about 1 minute, and involves a bearing down feeling or pressure in your pelvis.  You have back pain and your water breaks or you have vaginal bleeding.  You have back pain or numbness that travels down your leg.  Your back pain developed after you fell.  You develop pain on one side of your back.  You see blood in your urine.  You develop skin blisters in the area of your back pain. Summary  Back pain may be caused by several factors that are related to changes during your pregnancy.  Follow instructions as told by your health care provider for managing pain, stiffness, and swelling.  Exercise as told by your  health care provider. Gentle exercise is the best way to prevent or manage back pain.  Take over-the-counter and prescription medicines only as told by your health care provider.  Keep all follow-up visits as told by your health care provider. This is important. This information is not intended to replace advice given to you by your health care provider. Make sure you discuss any questions you have with your health care  provider. Document Revised: 10/06/2018 Document Reviewed: 12/03/2017 Elsevier Patient Education  2020 [employer].

## 2020-04-06 NOTE — Progress Notes (Signed)
[name]-Reports back pain and sciatica pain. Discussed home treatment measures, handouts provided. Physical therapy referral placed yesterday, will follow up after visit. Anticipatory guidance regarding course of prenatal care. Reviewed red flag symptoms and when to call. RTC x 4 weeks for 28 week labs, TDaP, and [name] or sooner if needed.

## 2020-04-13 ENCOUNTER — Ambulatory Visit: Payer: BC Managed Care – PPO | Attending: Certified Nurse Midwife

## 2020-04-13 ENCOUNTER — Other Ambulatory Visit: Payer: Self-pay

## 2020-04-13 DIAGNOSIS — M4124 Other idiopathic scoliosis, thoracic region: Secondary | ICD-10-CM | POA: Insufficient documentation

## 2020-04-13 DIAGNOSIS — M533 Sacrococcygeal disorders, not elsewhere classified: Secondary | ICD-10-CM | POA: Diagnosis present

## 2020-04-13 DIAGNOSIS — R293 Abnormal posture: Secondary | ICD-10-CM | POA: Insufficient documentation

## 2020-04-13 NOTE — Patient Instructions (Signed)
   Let the top arm rest on your side, and slide along the torso as you rotate. Breathe in as you come forward, out as you open up. Do 2x15 on each side.   Flexors, Lunge  Hip Flexor Stretch: Proposal Pose    Maintain pelvic tuck under, lift pubic bone toward navel. Engage posterior hip muscles (firm glute muscles of leg in back position) and shift forward until you feel stretch on front of leg that is down. To increase stretch, maintain balance and ease hips forward. You may use one hand on a chair for balance if needed. Hold for __5__ breaths. Repeat __2-3__ times each leg.  Do _1-2__ times per day.

## 2020-04-13 NOTE — Therapy (Signed)
[place] MAIN [facility] SERVICES [address] [place], [place], [address] Phone: [phone]   Fax:  [phone]  Physical Therapy Treatment  The patient has been informed of current processes in place at [facility]patient [facility]hab to protect patients from Covid-19 exposure including social distancing, schedule modifications, and new cleaning procedures. After discussing their particular risk with a therapist based on the patient's personal risk factors, the patient has decided to proceed with in-person therapy.  Patient Details  Name: Teresa Ortiz MRN: [id number] Date of Birth: Oct 30, 1990 No data recorded  Encounter Date: 04/13/2020    Past Medical History:  Diagnosis Date  . Asthma    childhood  . Frequent headaches   . Kidney stone   . Migraine without aura     Past Surgical History:  Procedure Laterality Date  . DILATION AND EVACUATION N/A 07/16/2017   Procedure: DILATATION AND EVACUATION;  Surgeon: Harlin Heys, MD;  Location: [facility] ORS;  Service: Gynecology;  Laterality: N/A;  . EXTRACORPOREAL SHOCK WAVE LITHOTRIPSY Left 07/01/2019   Procedure: EXTRACORPOREAL SHOCK WAVE LITHOTRIPSY (ESWL);  Surgeon: Billey Co, MD;  Location: [facility] ORS;  Service: Urology;  Laterality: Left;    There were no vitals filed for this visit.   Pelvic Floor Physical Therapy Evaluation and Assessment  SCREENING  Falls in last 6 mo: none     Patient's communication preference:   Red Flags:  Have you had any night sweats? no Unexplained weight loss? none Saddle anesthesia? none Unexplained changes in bowel or bladder habits? none  SUBJECTIVE  Patient reports: She is having LBP and SUI during pregnancy. Her back hurt during her last pregnancy and then stopped but has started back up. It has been ~ 2.5 months this time around.   Precautions:  [redacted] weeks pregnant, headaches, migraine without aura  Social/Family/Vocational History:   Works  full time at [employer] in [place] Procedures/Tests/Findings:  H/O sclerotic lesion on L femoral neck in 2020, not fully evaluated.  Obstetrical History: G5P3013, [redacted] weeks pregnant, no tearing or episiotomy.  Gynecological History: none  Urinary History: Has SUI without sensory awareness throughout the day.  E-coli UTI was ~ 2 months ago, gets more frequent UTI's when pregnant.   Gastrointestinal History: No issues  Sexual activity/pain: Has pain for the last 2 months, feels like  Something wants to fall out afterward.  Location of pain: LB , L hip and into LLE Current pain: 4/10  Max pain:  10/10 Least pain:  2/10 Nature of pain: Ache/heaviness with stabs  Patient Goals: Be able to work or clean/cook for 8 hours without pain higher than 3/10. Be    OBJECTIVE  Posture/Observations:  Sitting:  Standing: 5'10" R handed, forward rolled shoulders, hyperkyphosis/lordosis. L PSIS high. SB WNL but pain on L with R SB. ROT WNL to R, ~ 50% reduced to L with pain in L SIJ.  Palpation/Segmental Motion/Joint Play: Decreased mobility through thoracic spine, increased TTP through T/L and L/S junctions.  Special tests:   Supine-to-long-sit: LLE long in lying, less-long in sitting Scoliosis: mild L thoracic curve.  Range of Motion/Flexibilty:  Spine: WNL for SB B, L ROT ~ 50% limited Hips:   Strength/MMT:  Deferred to follow-up LE MMT  LE MMT Left Right  Hip flex:  (L2) /5 /5  Hip ext: /5 /5  Hip abd: /5 /5  Hip add: /5 /5  Hip IR /5 /5  Hip ER /5 /5  Abdominal:  Palpation: TTP to L>R hip flexors R>L adductors Diastasis: none noted  Pelvic Floor External Exam: Deferred to follow-up Introitus Appears:  Skin integrity:  Palpation: Cough: Prolapse visible?: Scar mobility:  Internal Vaginal Exam: Strength (PERF):  Symmetry: Palpation: Prolapse:   Internal Rectal Exam: Strength (PERF): Symmetry: Palpation: Prolapse:   Gait Analysis: Deferred  to follow-up   Pelvic Floor Outcome Measures: FOTO Insurance claims handler lost data)  INTERVENTIONS THIS SESSION: Self-care: Educated on the structure and function of the pelvic floor in relation to their symptoms as well as the POC, and initial HEP in order to set patient expectations and understanding from which we will build on in the future sessions. Discussed potential of Jonette Eva use for chronic UTI if medical provider agrees.  Total time: 60 min.                                PT Short Term Goals - 04/13/20 1440      PT SHORT TERM GOAL #1   Title Patient will demonstrate improved pelvic alignment and balance of musculature surrounding the pelvis to facilitate decreased PFM spasms and decrease pelvic pain.    Baseline L anterior rotation and possible LLD    Time 5    Period Weeks    Status New    Target Date 05/19/20      PT SHORT TERM GOAL #2   Title Patient will demonstrate HEP x1 in the clinic to demonstrate understanding and proper form to allow for further improvement.    Baseline Pt. lacks knowledge of what therapeutic exercises will help decrease her Sx.    Time 5    Period Weeks    Status New    Target Date 05/19/20      PT SHORT TERM GOAL #3   Title Patient will demonstrate improved sitting and standing posture to demonstrate learning and decrease stress on the pelvic floor with functional activity.    Baseline hyperkyphotic-lordotic posture    Time 5    Period Weeks    Status New    Target Date 05/19/20             PT Long Term Goals - 04/14/20 0913      PT LONG TERM GOAL #1   Title Patient will describe pain no greater than 3/10 during 8 hours of household activities or work to demonstrate improved functional ability.    Baseline Pain increases to 10/10 at the end of an 8 hour work day or cleaning/childcare/cooking    Time 10    Period Weeks    Status New    Target Date 06/23/20      PT LONG TERM GOAL #2   Title Patient will  report no episodes of SUI over the course of the prior two weeks to demonstrate improved functional ability.    Baseline having small amounts of leakage without sensory awareness daily    Time 10    Period Weeks    Status New    Target Date 06/23/20      PT LONG TERM GOAL #3   Title Pt. will improve in FOTO score by 10 points to demonstrate improved function.                 Plan - 04/13/20 1432    Clinical Impression Statement Pt. is a 29 y/o female who presents today with cheif c/o LB pain and SUI during pregnancy  as well as recent dyspareunia. Her PMH is significant for 3 prior vaginal deliveries, frequent headaches, and a kidney stone in 2020. Her clinical exam revealed a minor L thoracic scoliosis, hyperlordotic/kyphotic posture, spasms surrounding L>R hip with L anterior rotation and possible LLD as well as decreased thoracic PIVM, L spinal rotation ROM and pain with pressure at T/L and L/S junctions. She will benefit from skilled pelvic health PT to address the noted deficits and to continue to assess for and address any other potential causes of Sx.    Personal Factors and Comorbidities Comorbidity 3+    Comorbidities Scoliosis, pregnancy, frequent headaches.    Examination-Activity Limitations Caring for Others;Locomotion Level;Squat;Lift;Bend;Stand;Continence    Examination-Participation Restrictions Interpersonal Relationship;Occupation;Meal Prep;Laundry;Cleaning    Stability/Clinical Decision Making Evolving/Moderate complexity    Clinical Decision Making Moderate    Rehab Potential Good    PT Frequency 1x / week    PT Duration Other (comment)   10 weeks   PT Treatment/Interventions ADLs/Self Care Home Management;Ultrasound;Traction;Gait training;Stair training;Therapeutic activities;Neuromuscular re-education;Therapeutic exercise;Patient/family education;Orthotic Fit/Training;Manual techniques;Dry needling;Taping;Spinal Manipulations;Joint Manipulations    PT Next Visit  Plan TP release surrounding L hip, sacral mobility and MET for L anterior rotation, re-assess for LLD.    PT Home Exercise Plan hip-flexor stretch, bow-and-arrow, standing posture.    Consulted and Agree with Plan of Care Patient           Patient will benefit from skilled therapeutic intervention in order to improve the following deficits and impairments:  Abnormal gait, Decreased endurance, Difficulty walking, Increased muscle spasms, Improper body mechanics, Decreased activity tolerance, Decreased coordination, Hypermobility, Postural dysfunction, Pain  Visit Diagnosis: Abnormal posture  Sacrococcygeal disorders, not elsewhere classified  Other idiopathic scoliosis, thoracic region     Problem List Patient Active Problem List   Diagnosis Date Noted  . E. coli UTI 02/14/2020  . B12 deficiency anemia 05/01/2018  . Pregnancy 01/30/2018  . Migraine without aura 05/11/2014   Teresa Ortiz DPT, ATC Teresa Ortiz 04/14/2020, 9:26 AM  [place] MAIN [facility] SERVICES [address] [place], [place], [address] Phone: [phone]   Fax:  [phone]  Name: Teresa Ortiz MRN: [id number] Date of Birth: 08-Sep-1990

## 2020-04-25 ENCOUNTER — Ambulatory Visit: Payer: BC Managed Care – PPO

## 2020-05-09 ENCOUNTER — Other Ambulatory Visit: Payer: Self-pay

## 2020-05-09 ENCOUNTER — Encounter: Payer: Self-pay | Admitting: Certified Nurse Midwife

## 2020-05-09 ENCOUNTER — Other Ambulatory Visit: Payer: BC Managed Care – PPO

## 2020-05-09 ENCOUNTER — Ambulatory Visit (INDEPENDENT_AMBULATORY_CARE_PROVIDER_SITE_OTHER): Payer: BC Managed Care – PPO | Admitting: Certified Nurse Midwife

## 2020-05-09 VITALS — BP 102/57 | HR 82 | Wt 215.6 lb

## 2020-05-09 DIAGNOSIS — Z23 Encounter for immunization: Secondary | ICD-10-CM

## 2020-05-09 DIAGNOSIS — Z3A29 29 weeks gestation of pregnancy: Secondary | ICD-10-CM | POA: Diagnosis not present

## 2020-05-09 LAB — POCT URINALYSIS DIPSTICK OB
Bilirubin, UA: NEGATIVE
Blood, UA: NEGATIVE
Glucose, UA: NEGATIVE
Ketones, UA: NEGATIVE
Leukocytes, UA: NEGATIVE
Nitrite, UA: NEGATIVE
POC,PROTEIN,UA: NEGATIVE
Spec Grav, UA: 1.015 (ref 1.010–1.025)
Urobilinogen, UA: 0.2 E.U./dL
pH, UA: 6.5 (ref 5.0–8.0)

## 2020-05-09 LAB — OB RESULTS CONSOLE RPR: RPR: NONREACTIVE

## 2020-05-09 MED ORDER — FLUCONAZOLE 150 MG PO TABS: 150.0000 mg | ORAL_TABLET | Freq: Once | ORAL | 1 refills | Status: AC

## 2020-05-09 MED ORDER — TETANUS-DIPHTH-ACELL PERTUSSIS 5-2.5-18.5 LF-MCG/0.5 IM SUSY
0.5000 mL | PREFILLED_SYRINGE | Freq: Once | INTRAMUSCULAR | Status: AC
  Administered 2020-05-09: 0.5 mL via INTRAMUSCULAR

## 2020-05-09 NOTE — Progress Notes (Signed)
[name] doing well. Glucose screen/RPR/CBC/PTC/Tdap today. Discussed BC after baby. Consider a tubal. Will let us know at next visit. Discussed RSB, see check list for topics discussed. Reviewed birth plan. She is going to try to do it without epidural. Baby is going to be a JR. Will be called KJ

## 2020-05-09 NOTE — Patient Instructions (Signed)

## 2020-05-10 ENCOUNTER — Other Ambulatory Visit: Payer: Self-pay | Admitting: Certified Nurse Midwife

## 2020-05-10 DIAGNOSIS — O99019 Anemia complicating pregnancy, unspecified trimester: Secondary | ICD-10-CM

## 2020-05-10 LAB — CBC
Hematocrit: 28.8 % — ABNORMAL LOW (ref 34.0–46.6)
Hemoglobin: 8.8 g/dL — ABNORMAL LOW (ref 11.1–15.9)
MCH: 24 pg — ABNORMAL LOW (ref 26.6–33.0)
MCHC: 30.6 g/dL — ABNORMAL LOW (ref 31.5–35.7)
MCV: 79 fL (ref 79–97)
Platelets: 198 10*3/uL (ref 150–450)
RBC: 3.66 x10E6/uL — ABNORMAL LOW (ref 3.77–5.28)
RDW: 15 % (ref 11.7–15.4)
WBC: 6.2 10*3/uL (ref 3.4–10.8)

## 2020-05-10 LAB — RPR: RPR Ser Ql: NONREACTIVE

## 2020-05-10 LAB — GLUCOSE, 1 HOUR GESTATIONAL: Gestational Diabetes Screen: 86 mg/dL (ref 65–139)

## 2020-05-10 MED ORDER — FUSION PLUS PO CAPS
1.0000 | ORAL_CAPSULE | Freq: Every day | ORAL | 8 refills | Status: DC
Start: 2020-05-10 — End: 2020-09-01

## 2020-05-18 ENCOUNTER — Other Ambulatory Visit: Payer: Self-pay

## 2020-05-18 ENCOUNTER — Ambulatory Visit (INDEPENDENT_AMBULATORY_CARE_PROVIDER_SITE_OTHER): Payer: BC Managed Care – PPO | Admitting: Certified Nurse Midwife

## 2020-05-18 ENCOUNTER — Encounter: Payer: Self-pay | Admitting: Certified Nurse Midwife

## 2020-05-18 VITALS — BP 107/70 | HR 91 | Wt 215.7 lb

## 2020-05-18 DIAGNOSIS — Z3483 Encounter for supervision of other normal pregnancy, third trimester: Secondary | ICD-10-CM

## 2020-05-18 DIAGNOSIS — Z3A30 30 weeks gestation of pregnancy: Secondary | ICD-10-CM

## 2020-05-18 DIAGNOSIS — O99013 Anemia complicating pregnancy, third trimester: Secondary | ICD-10-CM

## 2020-05-18 LAB — POCT URINALYSIS DIPSTICK OB
Bilirubin, UA: NEGATIVE
Blood, UA: NEGATIVE
Glucose, UA: NEGATIVE
Ketones, UA: NEGATIVE
Leukocytes, UA: NEGATIVE
Nitrite, UA: NEGATIVE
POC,PROTEIN,UA: NEGATIVE
Spec Grav, UA: 1.015 (ref 1.010–1.025)
Urobilinogen, UA: 0.2 E.U./dL
pH, UA: 6.5 (ref 5.0–8.0)

## 2020-05-18 NOTE — Patient Instructions (Addendum)
Postpartum Tubal Ligation Postpartum tubal ligation (PPTL) is a procedure to close the fallopian tubes. This is done so that you cannot get pregnant. When the fallopian tubes are closed, the eggs that the ovaries release cannot enter the uterus, and sperm cannot reach the eggs. PPTL is done right after childbirth or 1-2 days after childbirth, before the uterus returns to its normal location. If you have a cesarean section, it can be performed at the same time as the procedure. Having this done after childbirth does not make your stay in the hospital longer. PPTL is sometimes called "getting your tubes tied." You should not have this procedure if you want to get pregnant again or if you are unsure about having more children. Tell a health care provider about:  Any allergies you have.  All medicines you are taking, including vitamins, herbs, eye drops, creams, and over-the-counter medicines.  Any problems you or family members have had with anesthetic medicines.  Any blood disorders you have.  Any surgeries you have had.  Any medical conditions you have or have had.  Any past pregnancies. What are the risks? Generally, this is a safe procedure. However, problems may occur, including:  Infection.  Bleeding.  Injury to other organs in the abdomen.  Side effects from anesthetic medicines.  Failure of the procedure. If this happens, you could get pregnant.  Having a fertilized egg attach outside the uterus (ectopic pregnancy). What happens before the procedure?  Ask your health care provider about: ? How much pain you can expect to have. ? What medicines you will be given for pain, especially if you are planning to breastfeed. What happens during the procedure? If you had a vaginal delivery:  You will be given one or more of the following: ? A medicine to help you relax (sedative). ? A medicine to numb the area (local anesthetic). ? A medicine to make you fall asleep (general  anesthetic). ? A medicine that is injected into an area of your body to numb everything below the injection site (regional anesthetic).  If you have been given a general anesthetic, a tube will be put down your throat to help you breathe.  An IV will be inserted into one of your veins.  Your bladder may be emptied with a small tube (catheter).  An incision will be made just below your belly button.  Your fallopian tubes will be located and brought up through the incision.  Your fallopian tubes will be tied off, burned (cauterized), or blocked with a clip, ring, or clamp. A small part in the center of each fallopian tube may be removed.  The incision will be closed with stitches (sutures).  A bandage (dressing) will be placed over the incision. If you had a cesarean delivery:  Tubal ligation will be done through the incision that was used for the cesarean delivery of your baby.  The incision will be closed with sutures.  A dressing will be placed over the incision. The procedure may vary among health care providers and hospitals. What happens after the procedure?  Your blood pressure, heart rate, breathing rate, and blood oxygen level will be monitored until you leave the hospital.  You will be given pain medicine as needed.  Do not drive for 24 hours if you were given a sedative during your procedure. Summary  Postpartum tubal ligation is a procedure that closes the fallopian tubes so you cannot get pregnant anymore.  This procedure is done while you are still  in the hospital after childbirth. If you have a cesarean section, it can be performed at the same time.  Having this done after childbirth does not make your stay in the hospital longer.  Postpartum tubal ligation is considered permanent. You should not have this procedure if you want to get pregnant again or if you are unsure about having more children.  Talk to your health care provider to see if this procedure is  right for you. This information is not intended to replace advice given to you by your health care provider. Make sure you discuss any questions you have with your health care provider. Document Revised: 11/30/2018 Document Reviewed: 05/07/2018 Elsevier Patient Education  2020 [employer].   Third Trimester of Pregnancy  The third trimester is from week 28 through week 40 (months 7 through 9). This trimester is when your unborn baby (fetus) is growing very fast. At the end of the ninth month, the unborn baby is about 20 inches in length. It weighs about 6-10 pounds. Follow these instructions at home: Medicines  Take over-the-counter and prescription medicines only as told by your doctor. Some medicines are safe and some medicines are not safe during pregnancy.  Take a prenatal vitamin that contains at least 600 micrograms (mcg) of folic acid.  If you have trouble pooping (constipation), take medicine that will make your stool soft (stool softener) if your doctor approves. Eating and drinking   Eat regular, healthy meals.  Avoid raw meat and uncooked cheese.  If you get low calcium from the food you eat, talk to your doctor about taking a daily calcium supplement.  Eat four or five small meals rather than three large meals a day.  Avoid foods that are high in fat and sugars, such as fried and sweet foods.  To prevent constipation: ? Eat foods that are high in fiber, like fresh fruits and vegetables, whole grains, and beans. ? Drink enough fluids to keep your pee (urine) clear or pale yellow. Activity  Exercise only as told by your doctor. Stop exercising if you start to have cramps.  Avoid heavy lifting, wear low heels, and sit up straight.  Do not exercise if it is too hot, too humid, or if you are in a place of great height (high altitude).  You may continue to have sex unless your doctor tells you not to. Relieving pain and discomfort  Wear a good support bra if your  breasts are tender.  Take frequent breaks and rest with your legs raised if you have leg cramps or low back pain.  Take warm water baths (sitz baths) to soothe pain or discomfort caused by hemorrhoids. Use hemorrhoid cream if your doctor approves.  If you develop puffy, bulging veins (varicose veins) in your legs: ? Wear support hose or compression stockings as told by your doctor. ? Raise (elevate) your feet for 15 minutes, 3-4 times a day. ? Limit salt in your food. Safety  Wear your seat belt when driving.  Make a list of emergency phone numbers, including numbers for family, friends, the hospital, and police and fire departments. Preparing for your baby's arrival To prepare for the arrival of your baby:  Take prenatal classes.  Practice driving to the hospital.  Visit the hospital and tour the maternity area.  Talk to your work about taking leave once the baby comes.  Pack your hospital bag.  Prepare the baby's room.  Go to your doctor visits.  Buy a rear-facing car  seat. Learn how to install it in your car. General instructions  Do not use hot tubs, steam rooms, or saunas.  Do not use any products that contain nicotine or tobacco, such as cigarettes and e-cigarettes. If you need help quitting, ask your doctor.  Do not drink alcohol.  Do not douche or use tampons or scented sanitary pads.  Do not cross your legs for long periods of time.  Do not travel for long distances unless you must. Only do so if your doctor says it is okay.  Visit your dentist if you have not gone during your pregnancy. Use a soft toothbrush to brush your teeth. Be gentle when you floss.  Avoid cat litter boxes and soil used by cats. These carry germs that can cause birth defects in the baby and can cause a loss of your baby (miscarriage) or stillbirth.  Keep all your prenatal visits as told by your doctor. This is important. Contact a doctor if:  You are not sure if you are in labor or  if your water has broken.  You are dizzy.  You have mild cramps or pressure in your lower belly.  You have a nagging pain in your belly area.  You continue to feel sick to your stomach, you throw up, or you have watery poop.  You have bad smelling fluid coming from your vagina.  You have pain when you pee. Get help right away if:  You have a fever.  You are leaking fluid from your vagina.  You are spotting or bleeding from your vagina.  You have severe belly cramps or pain.  You lose or gain weight quickly.  You have trouble catching your breath and have chest pain.  You notice sudden or extreme puffiness (swelling) of your face, hands, ankles, feet, or legs.  You have not felt the baby move in over an hour.  You have severe headaches that do not go away with medicine.  You have trouble seeing.  You are leaking, or you are having a gush of fluid, from your vagina before you are 37 weeks.  You have regular belly spasms (contractions) before you are 37 weeks. Summary  The third trimester is from week 28 through week 40 (months 7 through 9). This time is when your unborn baby is growing very fast.  Follow your doctor's advice about medicine, food, and activity.  Get ready for the arrival of your baby by taking prenatal classes, getting all the baby items ready, preparing the baby's room, and visiting your doctor to be checked.  Get help right away if you are bleeding from your vagina, or you have chest pain and trouble catching your breath, or if you have not felt your baby move in over an hour. This information is not intended to replace advice given to you by your health care provider. Make sure you discuss any questions you have with your health care provider. Document Revised: 10/08/2018 Document Reviewed: 07/23/2016 Elsevier Patient Education  2020 Else[employer] Inc[employer]   Fetal Movement Counts Patient Name: ________________________________________________ Patient Due  Date: ____________________ What is a fetal movement count?  A fetal movement count is the number of times that you feel your baby move during a certain amount of time. This may also be called a fetal kick count. A fetal movement count is recommended for every pregnant woman. You may be asked to start counting fetal movements as early as week 28 of your pregnancy. Pay attention to when your baby  is most active. You may notice your baby's sleep and wake cycles. You may also notice things that make your baby move more. You should do a fetal movement count:  When your baby is normally most active.  At the same time each day. A good time to count movements is while you are resting, after having something to eat and drink. How do I count fetal movements? 1. Find a quiet, comfortable area. Sit, or lie down on your side. 2. Write down the date, the start time and stop time, and the number of movements that you felt between those two times. Take this information with you to your health care visits. 3. Write down your start time when you feel the first movement. 4. Count kicks, flutters, swishes, rolls, and jabs. You should feel at least 10 movements. 5. You may stop counting after you have felt 10 movements, or if you have been counting for 2 hours. Write down the stop time. 6. If you do not feel 10 movements in 2 hours, contact your health care provider for further instructions. Your health care provider may want to do additional tests to assess your baby's well-being. Contact a health care provider if:  You feel fewer than 10 movements in 2 hours.  Your baby is not moving like he or she usually does. Date: ____________ Start time: ____________ Stop time: ____________ Movements: ____________ Date: ____________ Start time: ____________ Stop time: ____________ Movements: ____________ Date: ____________ Start time: ____________ Stop time: ____________ Movements: ____________ Date: ____________ Start  time: ____________ Stop time: ____________ Movements: ____________ Date: ____________ Start time: ____________ Stop time: ____________ Movements: ____________ Date: ____________ Start time: ____________ Stop time: ____________ Movements: ____________ Date: ____________ Start time: ____________ Stop time: ____________ Movements: ____________ Date: ____________ Start time: ____________ Stop time: ____________ Movements: ____________ Date: ____________ Start time: ____________ Stop time: ____________ Movements: ____________ This information is not intended to replace advice given to you by your health care provider. Make sure you discuss any questions you have with your health care provider. Document Revised: 02/04/2019 Document Reviewed: 02/04/2019 Elsevier Patient Education  2020 [employer].

## 2020-05-18 NOTE — Progress Notes (Signed)
[name]-Doing well, no concerns. Desires BTL as contraception; consent obtained and copy given to patient. Anemia labs today, see orders. Anticipatory guidance regarding course of prenatal care. Reviewed red flag symptoms and when to call. RTC x 2 weeks for [name] or sooner if needed.

## 2020-05-19 LAB — CBC
Hematocrit: 30.6 % — ABNORMAL LOW (ref 34.0–46.6)
Hemoglobin: 9.7 g/dL — ABNORMAL LOW (ref 11.1–15.9)
MCH: 24.6 pg — ABNORMAL LOW (ref 26.6–33.0)
MCHC: 31.7 g/dL (ref 31.5–35.7)
MCV: 78 fL — ABNORMAL LOW (ref 79–97)
Platelets: 215 10*3/uL (ref 150–450)
RBC: 3.94 x10E6/uL (ref 3.77–5.28)
RDW: 15.6 % — ABNORMAL HIGH (ref 11.7–15.4)
WBC: 7.2 10*3/uL (ref 3.4–10.8)

## 2020-05-19 LAB — B12 AND FOLATE PANEL
Folate: >20 ng/mL (ref >3.0)
Vitamin B-12: 369 pg/mL (ref 232–1245)

## 2020-05-19 LAB — FERRITIN: Ferritin: 10 ng/mL — ABNORMAL LOW (ref 15–150)

## 2020-06-06 ENCOUNTER — Encounter: Payer: Self-pay | Admitting: Certified Nurse Midwife

## 2020-06-06 ENCOUNTER — Other Ambulatory Visit: Payer: Self-pay

## 2020-06-06 ENCOUNTER — Ambulatory Visit (INDEPENDENT_AMBULATORY_CARE_PROVIDER_SITE_OTHER): Payer: BC Managed Care – PPO | Admitting: Certified Nurse Midwife

## 2020-06-06 VITALS — BP 119/65 | HR 93 | Wt 219.1 lb

## 2020-06-06 DIAGNOSIS — Z3A33 33 weeks gestation of pregnancy: Secondary | ICD-10-CM

## 2020-06-06 LAB — POCT URINALYSIS DIPSTICK OB
Bilirubin, UA: NEGATIVE
Blood, UA: NEGATIVE
Glucose, UA: NEGATIVE
Ketones, UA: NEGATIVE
Leukocytes, UA: NEGATIVE
Nitrite, UA: NEGATIVE
POC,PROTEIN,UA: NEGATIVE
Spec Grav, UA: 1.02 (ref 1.010–1.025)
Urobilinogen, UA: 0.2 E.U./dL
pH, UA: 5 (ref 5.0–8.0)

## 2020-06-06 NOTE — Progress Notes (Signed)
[name] doing well. Feels good movement. Discussed GBs at 36 wks she verbalizes and agree. Has normal musculoskeletal discomforts, otherwise not concerns. Follow up 2 wk with Marcelino Duster.  Doreene Burke, CNM

## 2020-06-06 NOTE — Patient Instructions (Signed)
Braxton Hicks Contractions °Contractions of the uterus can occur throughout pregnancy, but they are not always a sign that you are in labor. You may have practice contractions called Braxton Hicks contractions. These false labor contractions are sometimes confused with true labor. °What are Braxton Hicks contractions? °Braxton Hicks contractions are tightening movements that occur in the muscles of the uterus before labor. Unlike true labor contractions, these contractions do not result in opening (dilation) and thinning of the cervix. Toward the end of pregnancy (32-34 weeks), Braxton Hicks contractions can happen more often and may become stronger. These contractions are sometimes difficult to tell apart from true labor because they can be very uncomfortable. You should not feel embarrassed if you go to the hospital with false labor. °Sometimes, the only way to tell if you are in true labor is for your health care provider to look for changes in the cervix. The health care provider will do a physical exam and may monitor your contractions. If you are not in true labor, the exam should show that your cervix is not dilating and your water has not broken. °If there are no other health problems associated with your pregnancy, it is completely safe for you to be sent home with false labor. You may continue to have Braxton Hicks contractions until you go into true labor. °How to tell the difference between true labor and false labor °True labor °· Contractions last 30-70 seconds. °· Contractions become very regular. °· Discomfort is usually felt in the top of the uterus, and it spreads to the lower abdomen and low back. °· Contractions do not go away with walking. °· Contractions usually become more intense and increase in frequency. °· The cervix dilates and gets thinner. °False labor °· Contractions are usually shorter and not as strong as true labor contractions. °· Contractions are usually irregular. °· Contractions  are often felt in the front of the lower abdomen and in the groin. °· Contractions may go away when you walk around or change positions while lying down. °· Contractions get weaker and are shorter-lasting as time goes on. °· The cervix usually does not dilate or become thin. °Follow these instructions at home: ° °· Take over-the-counter and prescription medicines only as told by your health care provider. °· Keep up with your usual exercises and follow other instructions from your health care provider. °· Eat and drink lightly if you think you are going into labor. °· If Braxton Hicks contractions are making you uncomfortable: °? Change your position from lying down or resting to walking, or change from walking to resting. °? Sit and rest in a tub of warm water. °? Drink enough fluid to keep your urine pale yellow. Dehydration may cause these contractions. °? Do slow and deep breathing several times an hour. °· Keep all follow-up prenatal visits as told by your health care provider. This is important. °Contact a health care provider if: °· You have a fever. °· You have continuous pain in your abdomen. °Get help right away if: °· Your contractions become stronger, more regular, and closer together. °· You have fluid leaking or gushing from your vagina. °· You pass blood-tinged mucus (bloody show). °· You have bleeding from your vagina. °· You have low back pain that you never had before. °· You feel your baby’s head pushing down and causing pelvic pressure. °· Your baby is not moving inside you as much as it used to. °Summary °· Contractions that occur before labor are   called Braxton Hicks contractions, false labor, or practice contractions. °· Braxton Hicks contractions are usually shorter, weaker, farther apart, and less regular than true labor contractions. True labor contractions usually become progressively stronger and regular, and they become more frequent. °· Manage discomfort from Braxton Hicks contractions  by changing position, resting in a warm bath, drinking plenty of water, or practicing deep breathing. °This information is not intended to replace advice given to you by your health care provider. Make sure you discuss any questions you have with your health care provider. °Document Revised: 05/30/2017 Document Reviewed: 10/31/2016 °Elsevier Patient Education © 2020 Elsevier Inc. ° °

## 2020-06-16 ENCOUNTER — Other Ambulatory Visit: Payer: Self-pay

## 2020-06-16 ENCOUNTER — Other Ambulatory Visit (HOSPITAL_COMMUNITY)
Admission: RE | Admit: 2020-06-16 | Discharge: 2020-06-16 | Disposition: A | Discharge status: Home / Self Care | Payer: BC Managed Care – PPO | Source: Ambulatory Visit | Attending: Certified Nurse Midwife | Admitting: Certified Nurse Midwife

## 2020-06-16 ENCOUNTER — Ambulatory Visit (INDEPENDENT_AMBULATORY_CARE_PROVIDER_SITE_OTHER): Payer: BC Managed Care – PPO | Admitting: Certified Nurse Midwife

## 2020-06-16 VITALS — BP 112/77 | HR 93 | Wt 218.0 lb

## 2020-06-16 DIAGNOSIS — O26893 Other specified pregnancy related conditions, third trimester: Secondary | ICD-10-CM | POA: Diagnosis present

## 2020-06-16 DIAGNOSIS — Z3A34 34 weeks gestation of pregnancy: Secondary | ICD-10-CM

## 2020-06-16 DIAGNOSIS — Z3483 Encounter for supervision of other normal pregnancy, third trimester: Secondary | ICD-10-CM

## 2020-06-16 DIAGNOSIS — N898 Other specified noninflammatory disorders of vagina: Secondary | ICD-10-CM | POA: Insufficient documentation

## 2020-06-16 LAB — POCT URINALYSIS DIPSTICK OB
Bilirubin, UA: NEGATIVE
Blood, UA: NEGATIVE
Glucose, UA: NEGATIVE
Ketones, UA: NEGATIVE
Nitrite, UA: NEGATIVE
Spec Grav, UA: 1.01 (ref 1.010–1.025)
Urobilinogen, UA: 0.2 E.U./dL
pH, UA: 8 (ref 5.0–8.0)

## 2020-06-16 NOTE — Patient Instructions (Addendum)
Group B Streptococcus Test During Pregnancy Why am I having this test? Routine testing, also called screening, for group B streptococcus (GBS) is recommended for all pregnant women between the 36th and 37th week of pregnancy. GBS is a type of bacteria that can be passed from mother to baby during childbirth. Screening will help guide whether or not you will need treatment during labor and delivery to prevent complications such as:  An infection in your uterus during labor.  An infection in your uterus after delivery.  A serious infection in your baby after delivery, such as pneumonia, meningitis, or sepsis. GBS screening is not often done before 36 weeks of pregnancy unless you go into labor prematurely. What happens if I have group B streptococcus? If testing shows that you have GBS, your health care provider will recommend treatment with IV antibiotics during labor and delivery. This treatment significantly decreases the risk of complications for you and your baby. If you have a planned C-section and you have GBS, you may not need to be treated with antibiotics because GBS is usually passed to babies after labor starts and your water breaks. If you are in labor or your water breaks before your C-section, it is possible for GBS to get into your uterus and be passed to your baby, so you might need treatment. Is there a chance I may not need to be tested? You may not need to be tested for GBS if:  You have a urine test that shows GBS before 36 to 37 weeks.  You had a baby with GBS infection after a previous delivery. In these cases, you will automatically be treated for GBS during labor and delivery. What is being tested? This test is done to check if you have group B streptococcus in your vagina or rectum. What kind of sample is taken? To collect samples for this test, your health care provider will swab your vagina and rectum with a cotton swab. The sample is then sent to the lab to see if  GBS is present. What happens during the test?   You will remove your clothing from the waist down.  You will lie down on an exam table in the same position as you would for a pelvic exam.  Your health care provider will swab your vagina and rectum to collect samples for a culture test.  You will be able to go home after the test and do all your usual activities. How are the results reported? The test results are reported as positive or negative. What do the results mean?  A positive test means you are at risk for passing GBS to your baby during labor and delivery. Your health care provider will recommend that you are treated with an IV antibiotic during labor and delivery.  A negative test means you are at very low risk of passing GBS to your baby. There is still a low risk of passing GBS to your baby because sometimes test results may report that you do not have a condition when you do (false-negative result) or there is a chance that you may become infected with GBS after the test is done. You most likely will not need to be treated with an antibiotic during labor and delivery. Talk with your health care provider about what your results mean. Questions to ask your health care provider Ask your health care provider, or the department that is doing the test:  When will my results be ready?  How will I   get my results?  What are my treatment options? Summary  Routine testing (screening) for group B streptococcus (GBS) is recommended for all pregnant women between the 36th and 37th week of pregnancy.  GBS is a type of bacteria that can be passed from mother to baby during childbirth.  If testing shows that you have GBS, your health care provider will recommend that you are treated with IV antibiotics during labor and delivery. This treatment almost always prevents infection in newborns. This information is not intended to replace advice given to you by your health care provider. Make  sure you discuss any questions you have with your health care provider. Document Revised: 10/08/2018 Document Reviewed: 07/15/2018 Elsevier Patient Education  2020 Elsevier Inc.     Fetal Movement Counts Patient Name: ________________________________________________ Patient Due Date: ____________________ What is a fetal movement count?  A fetal movement count is the number of times that you feel your baby move during a certain amount of time. This may also be called a fetal kick count. A fetal movement count is recommended for every pregnant woman. You may be asked to start counting fetal movements as early as week 28 of your pregnancy. Pay attention to when your baby is most active. You may notice your baby's sleep and wake cycles. You may also notice things that make your baby move more. You should do a fetal movement count:  When your baby is normally most active.  At the same time each day. A good time to count movements is while you are resting, after having something to eat and drink. How do I count fetal movements? 1. Find a quiet, comfortable area. Sit, or lie down on your side. 2. Write down the date, the start time and stop time, and the number of movements that you felt between those two times. Take this information with you to your health care visits. 3. Write down your start time when you feel the first movement. 4. Count kicks, flutters, swishes, rolls, and jabs. You should feel at least 10 movements. 5. You may stop counting after you have felt 10 movements, or if you have been counting for 2 hours. Write down the stop time. 6. If you do not feel 10 movements in 2 hours, contact your health care provider for further instructions. Your health care provider may want to do additional tests to assess your baby's well-being. Contact a health care provider if:  You feel fewer than 10 movements in 2 hours.  Your baby is not moving like he or she usually does. Date: ____________  Start time: ____________ Stop time: ____________ Movements: ____________ Date: ____________ Start time: ____________ Stop time: ____________ Movements: ____________ Date: ____________ Start time: ____________ Stop time: ____________ Movements: ____________ Date: ____________ Start time: ____________ Stop time: ____________ Movements: ____________ Date: ____________ Start time: ____________ Stop time: ____________ Movements: ____________ Date: ____________ Start time: ____________ Stop time: ____________ Movements: ____________ Date: ____________ Start time: ____________ Stop time: ____________ Movements: ____________ Date: ____________ Start time: ____________ Stop time: ____________ Movements: ____________ Date: ____________ Start time: ____________ Stop time: ____________ Movements: ____________ This information is not intended to replace advice given to you by your health care provider. Make sure you discuss any questions you have with your health care provider. Document Revised: 02/04/2019 Document Reviewed: 02/04/2019 Elsevier Patient Education  2020 Elsevier Inc.  Ball CorporationBraxton Hicks Contractions Contractions of the uterus can occur throughout pregnancy, but they are not always a sign that you are in labor. You may have practice  contractions called Braxton Hicks contractions. These false labor contractions are sometimes confused with true labor. What are Deberah Pelton contractions? Braxton Hicks contractions are tightening movements that occur in the muscles of the uterus before labor. Unlike true labor contractions, these contractions do not result in opening (dilation) and thinning of the cervix. Toward the end of pregnancy (32-34 weeks), Braxton Hicks contractions can happen more often and may become stronger. These contractions are sometimes difficult to tell apart from true labor because they can be very uncomfortable. You should not feel embarrassed if you go to the hospital with false  labor. Sometimes, the only way to tell if you are in true labor is for your health care provider to look for changes in the cervix. The health care provider will do a physical exam and may monitor your contractions. If you are not in true labor, the exam should show that your cervix is not dilating and your water has not broken. If there are no other health problems associated with your pregnancy, it is completely safe for you to be sent home with false labor. You may continue to have Braxton Hicks contractions until you go into true labor. How to tell the difference between true labor and false labor True labor  Contractions last 30-70 seconds.  Contractions become very regular.  Discomfort is usually felt in the top of the uterus, and it spreads to the lower abdomen and low back.  Contractions do not go away with walking.  Contractions usually become more intense and increase in frequency.  The cervix dilates and gets thinner. False labor  Contractions are usually shorter and not as strong as true labor contractions.  Contractions are usually irregular.  Contractions are often felt in the front of the lower abdomen and in the groin.  Contractions may go away when you walk around or change positions while lying down.  Contractions get weaker and are shorter-lasting as time goes on.  The cervix usually does not dilate or become thin. Follow these instructions at home:   Take over-the-counter and prescription medicines only as told by your health care provider.  Keep up with your usual exercises and follow other instructions from your health care provider.  Eat and drink lightly if you think you are going into labor.  If Braxton Hicks contractions are making you uncomfortable: ? Change your position from lying down or resting to walking, or change from walking to resting. ? Sit and rest in a tub of warm water. ? Drink enough fluid to keep your urine pale yellow. Dehydration  may cause these contractions. ? Do slow and deep breathing several times an hour.  Keep all follow-up prenatal visits as told by your health care provider. This is important. Contact a health care provider if:  You have a fever.  You have continuous pain in your abdomen. Get help right away if:  Your contractions become stronger, more regular, and closer together.  You have fluid leaking or gushing from your vagina.  You pass blood-tinged mucus (bloody show).  You have bleeding from your vagina.  You have low back pain that you never had before.  You feel your baby's head pushing down and causing pelvic pressure.  Your baby is not moving inside you as much as it used to. Summary  Contractions that occur before labor are called Braxton Hicks contractions, false labor, or practice contractions.  Braxton Hicks contractions are usually shorter, weaker, farther apart, and less regular than true labor contractions.  True labor contractions usually become progressively stronger and regular, and they become more frequent.  Manage discomfort from [facility] contractions by changing position, resting in a warm bath, drinking plenty of water, or practicing deep breathing. This information is not intended to replace advice given to you by your health care provider. Make sure you discuss any questions you have with your health care provider. Document Revised: 05/30/2017 Document Reviewed: 10/31/2016 Elsevier Patient Education  2020 [employer].    Third Trimester of Pregnancy  The third trimester is from week 28 through week 40 (months 7 through 9). This trimester is when your unborn baby (fetus) is growing very fast. At the end of the ninth month, the unborn baby is about 20 inches in length. It weighs about 6-10 pounds. Follow these instructions at home: Medicines  Take over-the-counter and prescription medicines only as told by your doctor. Some medicines are safe and some  medicines are not safe during pregnancy.  Take a prenatal vitamin that contains at least 600 micrograms (mcg) of folic acid.  If you have trouble pooping (constipation), take medicine that will make your stool soft (stool softener) if your doctor approves. Eating and drinking   Eat regular, healthy meals.  Avoid raw meat and uncooked cheese.  If you get low calcium from the food you eat, talk to your doctor about taking a daily calcium supplement.  Eat four or five small meals rather than three large meals a day.  Avoid foods that are high in fat and sugars, such as fried and sweet foods.  To prevent constipation: ? Eat foods that are high in fiber, like fresh fruits and vegetables, whole grains, and beans. ? Drink enough fluids to keep your pee (urine) clear or pale yellow. Activity  Exercise only as told by your doctor. Stop exercising if you start to have cramps.  Avoid heavy lifting, wear low heels, and sit up straight.  Do not exercise if it is too hot, too humid, or if you are in a place of great height (high altitude).  You may continue to have sex unless your doctor tells you not to. Relieving pain and discomfort  Wear a good support bra if your breasts are tender.  Take frequent breaks and rest with your legs raised if you have leg cramps or low back pain.  Take warm water baths (sitz baths) to soothe pain or discomfort caused by hemorrhoids. Use hemorrhoid cream if your doctor approves.  If you develop puffy, bulging veins (varicose veins) in your legs: ? Wear support hose or compression stockings as told by your doctor. ? Raise (elevate) your feet for 15 minutes, 3-4 times a day. ? Limit salt in your food. Safety  Wear your seat belt when driving.  Make a list of emergency phone numbers, including numbers for family, friends, the hospital, and police and fire departments. Preparing for your baby's arrival To prepare for the arrival of your baby:  Take  prenatal classes.  Practice driving to the hospital.  Visit the hospital and tour the maternity area.  Talk to your work about taking leave once the baby comes.  Pack your hospital bag.  Prepare the baby's room.  Go to your doctor visits.  Buy a rear-facing car seat. Learn how to install it in your car. General instructions  Do not use hot tubs, steam rooms, or saunas.  Do not use any products that contain nicotine or tobacco, such as cigarettes and e-cigarettes. If you need help  quitting, ask your doctor.  Do not drink alcohol.  Do not douche or use tampons or scented sanitary pads.  Do not cross your legs for long periods of time.  Do not travel for long distances unless you must. Only do so if your doctor says it is okay.  Visit your dentist if you have not gone during your pregnancy. Use a soft toothbrush to brush your teeth. Be gentle when you floss.  Avoid cat litter boxes and soil used by cats. These carry germs that can cause birth defects in the baby and can cause a loss of your baby (miscarriage) or stillbirth.  Keep all your prenatal visits as told by your doctor. This is important. Contact a doctor if:  You are not sure if you are in labor or if your water has broken.  You are dizzy.  You have mild cramps or pressure in your lower belly.  You have a nagging pain in your belly area.  You continue to feel sick to your stomach, you throw up, or you have watery poop.  You have bad smelling fluid coming from your vagina.  You have pain when you pee. Get help right away if:  You have a fever.  You are leaking fluid from your vagina.  You are spotting or bleeding from your vagina.  You have severe belly cramps or pain.  You lose or gain weight quickly.  You have trouble catching your breath and have chest pain.  You notice sudden or extreme puffiness (swelling) of your face, hands, ankles, feet, or legs.  You have not felt the baby move in over an  hour.  You have severe headaches that do not go away with medicine.  You have trouble seeing.  You are leaking, or you are having a gush of fluid, from your vagina before you are 37 weeks.  You have regular belly spasms (contractions) before you are 37 weeks. Summary  The third trimester is from week 28 through week 40 (months 7 through 9). This time is when your unborn baby is growing very fast.  Follow your doctor's advice about medicine, food, and activity.  Get ready for the arrival of your baby by taking prenatal classes, getting all the baby items ready, preparing the baby's room, and visiting your doctor to be checked.  Get help right away if you are bleeding from your vagina, or you have chest pain and trouble catching your breath, or if you have not felt your baby move in over an hour. This information is not intended to replace advice given to you by your health care provider. Make sure you discuss any questions you have with your health care provider. Document Revised: 10/08/2018 Document Reviewed: 07/23/2016 Elsevier Patient Education  2020 [employer].

## 2020-06-16 NOTE — Progress Notes (Signed)
Pt present for routine prenatal visit. C/o pain in the lower back, lots of pressure in vaginal area,  Increased yellow tinted d/c with no odor.

## 2020-06-16 NOTE — Progress Notes (Signed)
I have seen, interviewed, and examined the patient in conjunction with the [employer] and affirm the diagnosis and management plan.   Gunnar Bulla, CNM [facility], [facility] 06/16/20 9:27 AM

## 2020-06-16 NOTE — Progress Notes (Signed)
[name]- Doing well, reports increased vaginal discharge and irritation daily. Vaginal swab collected, see orders. Questions and concerns addressed.Anticipatory guidance regarding course or prenatal care. Reviewed red flag symptoms and when to call. RTC x 2 weeks for GBS culture [name] with Pattricia Boss or sooner if needed.  Juliann Pares, Student-Mid[employer]ife [employer] 06/16/20 9:19 AM

## 2020-06-19 LAB — CERVICOVAGINAL ANCILLARY ONLY
Bacterial Vaginitis (gardnerella): NEGATIVE
Candida Glabrata: NEGATIVE
Candida Vaginitis: POSITIVE — AB
Chlamydia: NEGATIVE
Comment: NEGATIVE
Comment: NEGATIVE
Comment: NEGATIVE
Comment: NEGATIVE
Comment: NEGATIVE
Comment: NORMAL
Neisseria Gonorrhea: NEGATIVE
Trichomonas: NEGATIVE

## 2020-06-20 ENCOUNTER — Other Ambulatory Visit: Payer: Self-pay

## 2020-06-20 MED ORDER — FLUCONAZOLE 100 MG PO TABS: 100.0000 mg | ORAL_TABLET | Freq: Every day | ORAL | 1 refills | Status: DC

## 2020-06-20 NOTE — Progress Notes (Signed)
Rx Diflucan

## 2020-06-22 ENCOUNTER — Encounter: Payer: BC Managed Care – PPO | Admitting: Certified Nurse Midwife

## 2020-06-26 NOTE — Telephone Encounter (Signed)
Patient called in stating that she is still having that headache she sent a MyChart message on this weekend. Patient wants to know if there is anything she can do because the pain is beginning to be intolerable.  Could you please advise?

## 2020-06-27 ENCOUNTER — Ambulatory Visit (INDEPENDENT_AMBULATORY_CARE_PROVIDER_SITE_OTHER): Payer: BC Managed Care – PPO | Admitting: Certified Nurse Midwife

## 2020-06-27 ENCOUNTER — Encounter: Payer: Self-pay | Admitting: Certified Nurse Midwife

## 2020-06-27 ENCOUNTER — Other Ambulatory Visit: Payer: Self-pay | Admitting: Certified Nurse Midwife

## 2020-06-27 ENCOUNTER — Other Ambulatory Visit: Payer: Self-pay

## 2020-06-27 VITALS — BP 108/74 | HR 91 | Wt 218.5 lb

## 2020-06-27 DIAGNOSIS — Z3483 Encounter for supervision of other normal pregnancy, third trimester: Secondary | ICD-10-CM

## 2020-06-27 DIAGNOSIS — Z3A36 36 weeks gestation of pregnancy: Secondary | ICD-10-CM

## 2020-06-27 LAB — POCT URINALYSIS DIPSTICK OB
Bilirubin, UA: NEGATIVE
Glucose, UA: NEGATIVE
Ketones, UA: NEGATIVE
Leukocytes, UA: NEGATIVE
Nitrite, UA: NEGATIVE
Spec Grav, UA: 1.02 (ref 1.010–1.025)
Urobilinogen, UA: 0.2 E.U./dL
pH, UA: 6.5 (ref 5.0–8.0)

## 2020-06-27 LAB — OB RESULTS CONSOLE GBS: GBS: NEGATIVE

## 2020-06-27 NOTE — Progress Notes (Signed)
[name] doing well. GBS and cultures today. SVE per pt request. Discussed labor precautions. Herbal prep handout given. Follow up 1 wk   Doreene Burke, CNM

## 2020-06-27 NOTE — Progress Notes (Signed)
Pt present for routine prenatal visit. Doing well. No complaints 

## 2020-06-27 NOTE — Patient Instructions (Signed)
Group B Streptococcus Infection During Pregnancy °Group B Streptococcus (GBS) is a type of bacteria that is often found in healthy people. It is commonly found in the rectum, vagina, and intestines. In people who are healthy and not pregnant, the bacteria rarely cause serious illness or complications. However, women who test positive for GBS during pregnancy can pass the bacteria to the baby during childbirth. This can cause serious infection in the baby after birth. °Women with GBS may also have infections during their pregnancy or soon after childbirth. The infections include urinary tract infections (UTIs) or infections of the uterus. GBS also increases a woman's risk of complications during pregnancy, such as early labor or delivery, miscarriage, or stillbirth. Routine testing for GBS is recommended for all pregnant women. °What are the causes? °This condition is caused by bacteria called Streptococcus agalactiae. °What increases the risk? °You may have a higher risk for GBS infection during pregnancy if you had one during a past pregnancy. °What are the signs or symptoms? °In most cases, GBS infection does not cause symptoms in pregnant women. If symptoms exist, they may include: °· Labor that starts before the 37th week of pregnancy. °· A UTI or bladder infection. This may cause a fever, frequent urination, or pain and burning during urination. °· Fever during labor. There can also be a rapid heartbeat in the mother or baby. °Rare but serious symptoms of a GBS infection in women include: °· Blood infection (septicemia). This may cause fever, chills, or confusion. °· Lung infection (pneumonia). This may cause fever, chills, cough, rapid breathing, chest pain, or difficulty breathing. °· Bone, joint, skin, or soft tissue infection. °How is this diagnosed? °You may be screened for GBS between week 35 and week 37 of pregnancy. If you have symptoms of preterm labor, you may be screened earlier. This condition is  diagnosed based on lab test results from: °· A swab of fluid from the vagina and rectum. °· A urine sample. °How is this treated? °This condition is treated with antibiotic medicine. Antibiotic medicine may be given: °· To you when you go into labor, or as soon as your water breaks. The medicines will continue until after you give birth. If you are having a cesarean delivery, you do not need antibiotics unless your water has broken. °· To your baby, if he or she requires treatment. Your health care provider will check your baby to decide if he or she needs antibiotics to prevent a serious infection. °Follow these instructions at home: °· Take over-the-counter and prescription medicines only as told by your health care provider. °· Take your antibiotic medicine as told by your health care provider. Do not stop taking the antibiotic even if you start to feel better. °· Keep all pre-birth (prenatal) visits and follow-up visits as told by your health care provider. This is important. °Contact a health care provider if: °· You have pain or burning when you urinate. °· You have to urinate more often than usual. °· You have a fever or chills. °· You develop a bad-smelling vaginal discharge. °Get help right away if: °· Your water breaks. °· You go into labor. °· You have severe pain in your abdomen. °· You have difficulty breathing. °· You have chest pain. °These symptoms may represent a serious problem that is an emergency. Do not wait to see if the symptoms will go away. Get medical help right away. Call your local emergency services (911 in the [place] Do not drive yourself to   the hospital. °Summary °· GBS is a type of bacteria that is common in healthy people. °· During pregnancy, colonization with GBS can cause serious complications for you or your baby. °· Your health care provider will screen you between 35 and 37 weeks of pregnancy to determine if you are colonized with GBS. °· If you are colonized with GBS during  pregnancy, your health care provider will recommend antibiotics through an IV during labor. °· After delivery, your baby will be evaluated for complications related to potential GBS infection and may require antibiotics to prevent a serious infection. °This information is not intended to replace advice given to you by your health care provider. Make sure you discuss any questions you have with your health care provider. °Document Revised: 01/11/2019 Document Reviewed: 01/11/2019 °Elsevier Patient Education © 2020 Elsevier Inc. ° °

## 2020-06-28 ENCOUNTER — Encounter: Payer: Self-pay | Admitting: Certified Nurse Midwife

## 2020-06-29 ENCOUNTER — Observation Stay
Admission: EM | Admit: 2020-06-29 | Discharge: 2020-06-29 | Disposition: A | Discharge status: Home / Self Care | Payer: BC Managed Care – PPO | Attending: Certified Nurse Midwife | Admitting: Certified Nurse Midwife

## 2020-06-29 ENCOUNTER — Encounter: Payer: Self-pay | Admitting: Obstetrics and Gynecology

## 2020-06-29 ENCOUNTER — Other Ambulatory Visit: Payer: Self-pay

## 2020-06-29 DIAGNOSIS — O26893 Other specified pregnancy related conditions, third trimester: Secondary | ICD-10-CM

## 2020-06-29 DIAGNOSIS — Z3490 Encounter for supervision of normal pregnancy, unspecified, unspecified trimester: Secondary | ICD-10-CM

## 2020-06-29 DIAGNOSIS — O418X31 Other specified disorders of amniotic fluid and membranes, third trimester, fetus 1: Secondary | ICD-10-CM | POA: Diagnosis not present

## 2020-06-29 DIAGNOSIS — Z3A36 36 weeks gestation of pregnancy: Secondary | ICD-10-CM

## 2020-06-29 LAB — WET PREP, GENITAL
Clue Cells Wet Prep HPF POC: NONE SEEN
Sperm: NONE SEEN
Trich, Wet Prep: NONE SEEN
Yeast Wet Prep HPF POC: NONE SEEN

## 2020-06-29 LAB — RUPTURE OF MEMBRANE (ROM)PLUS: Rom Plus: NEGATIVE

## 2020-06-29 LAB — GC/CHLAMYDIA PROBE AMP
Chlamydia trachomatis, NAA: NEGATIVE
Neisseria Gonorrhoeae by PCR: NEGATIVE

## 2020-06-29 LAB — STREP GP B NAA: Strep Gp B NAA: NEGATIVE

## 2020-06-29 NOTE — Discharge Summary (Signed)
Physician Obstetric Discharge Summary  Patient ID: Teresa Ortiz MRN: [id number] DOB/AGE: 30-03-1991 30 y.o.   Date of Admission: 06/29/2020  Date of Discharge:  06/29/20  Admitting Diagnosis: Observation at [redacted]w[redacted]d  Secondary Diagnosis:   Patient Active Problem List   Diagnosis Date Noted  . Pregnant 06/29/2020  . Indication for care in labor and delivery, antepartum 06/29/2020  . E. coli UTI 02/14/2020  . B12 deficiency anemia 05/01/2018  . Pregnancy 01/30/2018  . Migraine without aura 05/11/2014       Discharge Diagnosis: No other diagnosis   Antepartum Procedures: Wet prep, ROM plus, NST   Brief Hospital Course   L&D OB Triage Note  Teresa Ortiz is a 29 y.o. A1P3790 female at [redacted]w[redacted]d, EDD Estimated Date of Delivery: 07/25/20 who presented to triage for complaints of leakage of fluid, questions spontaneous rupture of membranes.  She was evaluated by the nurses with no significant findings for SROM, preterm labor, and fetal or maternal distress.  Vital signs stable. An NST was performed and has been reviewed by CNM.   NST INTERPRETATION: Indications: rule out uterine contractions  Mode: External Baseline Rate (A): 135 bpm              Impression: reactive   Plan: NST performed was reviewed and was found to be reactive. She was discharged home with bleeding/labor precautions.  Continue routine prenatal care. Follow up with CNM as previously scheduled.     Discharge Instructions: Per After Visit Summary  Activity:  Also refer to After Visit Summary  Diet: Regular  Medications: Allergies as of 06/29/2020      Reactions   Sulfa Antibiotics Rash      Medication List    TAKE these medications   Fusion Plus Caps Take 1 tablet by mouth daily.   multivitamin-prenatal 27-0.8 MG Tabs tablet Take 1 tablet by mouth daily at 12 noon.      Outpatient follow up: As previously scheduled or sooner if needed  Postpartum contraception: bilateral tubal  ligation  Discharged Condition: stable  Discharged to: home   Serafina Royals, CNM  [facility], [facility] 06/29/20 11:55 PM

## 2020-06-29 NOTE — OB Triage Note (Signed)
Pt is G5P3 with c/o possible rupture of membranes. She saw CNM on Tuesday and since exam has felt discharge and wetness. Pt feels + FM. Denies UCs. No medical complications during pregnancy.

## 2020-07-01 NOTE — L&D Delivery Note (Addendum)
Delivery Note  2030 In room to see patient, fetal vertex visible with maternal pushing efforts.   Spontaneous vaginal birth of liveborn female infant in left occiput anterior position through loose nuchal cord at 2033. Infant immediately to maternal abdomen, delayed cord clamping, skin to skin, and three (3) vessel cord. APGARs: 8, 9. Weight pending. Receiving nurse present at bedside.   IM pitocin given in left thigh, see MAR. Spontaneous delivery of intact placenta at 2041. First degree perineal laceration hemostatic, unrepaired. Uterus firm. Rubra small. Vault check completed. Counts correct x 2. EBL: 300 ml.   Initiate routine postpartum care and orders. Mom to postpartum.  Baby to Couplet care / Skin to Skin.  FOB present at bedside and overjoyed with the birth of "Kendell".    Serafina Royals, CNM [facility], [facility] 07/24/2020, 8:54 PM

## 2020-07-04 ENCOUNTER — Ambulatory Visit (INDEPENDENT_AMBULATORY_CARE_PROVIDER_SITE_OTHER): Payer: BC Managed Care – PPO | Admitting: Certified Nurse Midwife

## 2020-07-04 ENCOUNTER — Other Ambulatory Visit: Payer: Self-pay

## 2020-07-04 ENCOUNTER — Encounter: Payer: Self-pay | Admitting: Certified Nurse Midwife

## 2020-07-04 VITALS — BP 112/71 | HR 93 | Wt 215.1 lb

## 2020-07-04 DIAGNOSIS — G4452 New daily persistent headache (NDPH): Secondary | ICD-10-CM

## 2020-07-04 DIAGNOSIS — Z3A37 37 weeks gestation of pregnancy: Secondary | ICD-10-CM

## 2020-07-04 LAB — POCT URINALYSIS DIPSTICK OB
Bilirubin, UA: NEGATIVE
Blood, UA: NEGATIVE
Glucose, UA: NEGATIVE
Ketones, UA: NEGATIVE
Leukocytes, UA: NEGATIVE
Nitrite, UA: NEGATIVE
POC,PROTEIN,UA: NEGATIVE
Spec Grav, UA: 1.01 (ref 1.010–1.025)
Urobilinogen, UA: 0.2 E.U./dL
pH, UA: 5 (ref 5.0–8.0)

## 2020-07-04 NOTE — Progress Notes (Addendum)
[name] doing well. Feels good movement.c/o headaches that have caused nausea and vomiting . State pain if right eye stabbing.Deneis visual changes, epigastric pain, swelling. Negative clonus, 1+ reflexes bilaterally. labs collected.  Labor precautions reviewed.S&S pre e reviewed. Follow up 1wk with Marcelino Duster.   Doreene Burke, CNM

## 2020-07-04 NOTE — Patient Instructions (Signed)
Braxton Hicks Contractions °Contractions of the uterus can occur throughout pregnancy, but they are not always a sign that you are in labor. You may have practice contractions called Braxton Hicks contractions. These false labor contractions are sometimes confused with true labor. °What are Braxton Hicks contractions? °Braxton Hicks contractions are tightening movements that occur in the muscles of the uterus before labor. Unlike true labor contractions, these contractions do not result in opening (dilation) and thinning of the cervix. Toward the end of pregnancy (32-34 weeks), Braxton Hicks contractions can happen more often and may become stronger. These contractions are sometimes difficult to tell apart from true labor because they can be very uncomfortable. You should not feel embarrassed if you go to the hospital with false labor. °Sometimes, the only way to tell if you are in true labor is for your health care provider to look for changes in the cervix. The health care provider will do a physical exam and may monitor your contractions. If you are not in true labor, the exam should show that your cervix is not dilating and your water has not broken. °If there are no other health problems associated with your pregnancy, it is completely safe for you to be sent home with false labor. You may continue to have Braxton Hicks contractions until you go into true labor. °How to tell the difference between true labor and false labor °True labor °· Contractions last 30-70 seconds. °· Contractions become very regular. °· Discomfort is usually felt in the top of the uterus, and it spreads to the lower abdomen and low back. °· Contractions do not go away with walking. °· Contractions usually become more intense and increase in frequency. °· The cervix dilates and gets thinner. °False labor °· Contractions are usually shorter and not as strong as true labor contractions. °· Contractions are usually irregular. °· Contractions  are often felt in the front of the lower abdomen and in the groin. °· Contractions may go away when you walk around or change positions while lying down. °· Contractions get weaker and are shorter-lasting as time goes on. °· The cervix usually does not dilate or become thin. °Follow these instructions at home: ° °· Take over-the-counter and prescription medicines only as told by your health care provider. °· Keep up with your usual exercises and follow other instructions from your health care provider. °· Eat and drink lightly if you think you are going into labor. °· If Braxton Hicks contractions are making you uncomfortable: °? Change your position from lying down or resting to walking, or change from walking to resting. °? Sit and rest in a tub of warm water. °? Drink enough fluid to keep your urine pale yellow. Dehydration may cause these contractions. °? Do slow and deep breathing several times an hour. °· Keep all follow-up prenatal visits as told by your health care provider. This is important. °Contact a health care provider if: °· You have a fever. °· You have continuous pain in your abdomen. °Get help right away if: °· Your contractions become stronger, more regular, and closer together. °· You have fluid leaking or gushing from your vagina. °· You pass blood-tinged mucus (bloody show). °· You have bleeding from your vagina. °· You have low back pain that you never had before. °· You feel your baby’s head pushing down and causing pelvic pressure. °· Your baby is not moving inside you as much as it used to. °Summary °· Contractions that occur before labor are   called Braxton Hicks contractions, false labor, or practice contractions. °· Braxton Hicks contractions are usually shorter, weaker, farther apart, and less regular than true labor contractions. True labor contractions usually become progressively stronger and regular, and they become more frequent. °· Manage discomfort from Braxton Hicks contractions  by changing position, resting in a warm bath, drinking plenty of water, or practicing deep breathing. °This information is not intended to replace advice given to you by your health care provider. Make sure you discuss any questions you have with your health care provider. °Document Revised: 05/30/2017 Document Reviewed: 10/31/2016 °Elsevier Patient Education © 2020 Elsevier Inc. ° °

## 2020-07-05 LAB — CBC WITH DIFFERENTIAL/PLATELET
Basophils Absolute: 0 10*3/uL (ref 0.0–0.2)
Basos: 0 %
EOS (ABSOLUTE): 0 10*3/uL (ref 0.0–0.4)
Eos: 0 %
Hematocrit: 31.1 % — ABNORMAL LOW (ref 34.0–46.6)
Hemoglobin: 10 g/dL — ABNORMAL LOW (ref 11.1–15.9)
Immature Grans (Abs): 0.1 10*3/uL (ref 0.0–0.1)
Immature Granulocytes: 1 %
Lymphocytes Absolute: 1.1 10*3/uL (ref 0.7–3.1)
Lymphs: 12 %
MCH: 24.4 pg — ABNORMAL LOW (ref 26.6–33.0)
MCHC: 32.2 g/dL (ref 31.5–35.7)
MCV: 76 fL — ABNORMAL LOW (ref 79–97)
Monocytes Absolute: 0.5 10*3/uL (ref 0.1–0.9)
Monocytes: 6 %
Neutrophils Absolute: 7.3 10*3/uL — ABNORMAL HIGH (ref 1.4–7.0)
Neutrophils: 81 %
Platelets: 235 10*3/uL (ref 150–450)
RBC: 4.09 x10E6/uL (ref 3.77–5.28)
RDW: 17.1 % — ABNORMAL HIGH (ref 11.7–15.4)
WBC: 9 10*3/uL (ref 3.4–10.8)

## 2020-07-05 LAB — COMPREHENSIVE METABOLIC PANEL
ALT: 19 IU/L (ref 0–32)
AST: 15 IU/L (ref 0–40)
Albumin/Globulin Ratio: 1.2 (ref 1.2–2.2)
Albumin: 3.6 g/dL — ABNORMAL LOW (ref 3.9–5.0)
Alkaline Phosphatase: 195 IU/L — ABNORMAL HIGH (ref 44–121)
BUN/Creatinine Ratio: 14 (ref 9–23)
BUN: 10 mg/dL (ref 6–20)
Bilirubin Total: 0.5 mg/dL (ref 0.0–1.2)
CO2: 21 mmol/L (ref 20–29)
Calcium: 9.2 mg/dL (ref 8.7–10.2)
Chloride: 102 mmol/L (ref 96–106)
Creatinine, Ser: 0.74 mg/dL (ref 0.57–1.00)
GFR calc Af Amer: 126 mL/min/{1.73_m2} (ref >59)
GFR calc non Af Amer: 109 mL/min/{1.73_m2} (ref >59)
Globulin, Total: 3 g/dL (ref 1.5–4.5)
Glucose: 74 mg/dL (ref 65–99)
Potassium: 3.9 mmol/L (ref 3.5–5.2)
Sodium: 140 mmol/L (ref 134–144)
Total Protein: 6.6 g/dL (ref 6.0–8.5)

## 2020-07-05 LAB — PROTEIN / CREATININE RATIO, URINE
Creatinine, Urine: 177.7 mg/dL
Protein, Ur: 27.9 mg/dL
Protein/Creat Ratio: 157 mg/g creat (ref 0–200)

## 2020-07-05 NOTE — Addendum Note (Signed)
Addended by: Brooke Dare on: 07/05/2020 01:42 PM   Modules accepted: Orders

## 2020-07-12 ENCOUNTER — Telehealth: Payer: Self-pay

## 2020-07-12 NOTE — Telephone Encounter (Signed)
mychart message sent to patient re: no visitors 

## 2020-07-13 ENCOUNTER — Other Ambulatory Visit: Payer: Self-pay

## 2020-07-13 ENCOUNTER — Ambulatory Visit (INDEPENDENT_AMBULATORY_CARE_PROVIDER_SITE_OTHER): Payer: BC Managed Care – PPO | Admitting: Certified Nurse Midwife

## 2020-07-13 ENCOUNTER — Encounter: Payer: Self-pay | Admitting: Certified Nurse Midwife

## 2020-07-13 VITALS — BP 113/78 | HR 111 | Wt 219.7 lb

## 2020-07-13 DIAGNOSIS — Z3A38 38 weeks gestation of pregnancy: Secondary | ICD-10-CM

## 2020-07-13 DIAGNOSIS — Z3483 Encounter for supervision of other normal pregnancy, third trimester: Secondary | ICD-10-CM

## 2020-07-13 LAB — POCT URINALYSIS DIPSTICK OB
Bilirubin, UA: NEGATIVE
Blood, UA: NEGATIVE
Glucose, UA: NEGATIVE
Ketones, UA: NEGATIVE
Leukocytes, UA: NEGATIVE
Nitrite, UA: NEGATIVE
Spec Grav, UA: 1.02 (ref 1.010–1.025)
Urobilinogen, UA: 0.2 E.U./dL
pH, UA: 6.5 (ref 5.0–8.0)

## 2020-07-13 NOTE — Progress Notes (Signed)
[name]- Reports left sided pelvic/lower abdominal pain. Encouraged to do pelvic tilts on the wall and curb walk at home. [facility] [employer] [facility] [employer] given. SVE and membrane sweep completed by CNM per patient request. Anticipatory guidance regarding course of prenatal care. Reviewed red flag symptoms and when to call. RTC x 1 week for [name] with ANNIE or sooner if needed.   Juliann Pares, Student-MidWife [employer] 07/13/20  12:07 PM

## 2020-07-13 NOTE — Progress Notes (Signed)
[name]-Pt present for routine prenatal care. Pt c/o left side pain and braxton hick contractions.

## 2020-07-13 NOTE — Progress Notes (Signed)
I have seen, interviewed, and examined the patient in conjunction with the [employer] and affirm the diagnosis and management plan.   Gunnar Bulla, CNM [facility], [facility] 07/13/20 12:12 PM

## 2020-07-13 NOTE — Patient Instructions (Signed)
Fetal Movement Counts Patient Name: ________________________________________________ Patient Due Date: ____________________  What is a fetal movement count? A fetal movement count is the number of times that you feel your baby move during a certain amount of time. This may also be called a fetal kick count. A fetal movement count is recommended for every pregnant woman. You may be asked to start counting fetal movements as early as week 28 of your pregnancy. Pay attention to when your baby is most active. You may notice your baby's sleep and wake cycles. You may also notice things that make your baby move more. You should do a fetal movement count:  When your baby is normally most active.  At the same time each day. A good time to count movements is while you are resting, after having something to eat and drink. How do I count fetal movements? 1. Find a quiet, comfortable area. Sit, or lie down on your side. 2. Write down the date, the start time and stop time, and the number of movements that you felt between those two times. Take this information with you to your health care visits. 3. Write down your start time when you feel the first movement. 4. Count kicks, flutters, swishes, rolls, and jabs. You should feel at least 10 movements. 5. You may stop counting after you have felt 10 movements, or if you have been counting for 2 hours. Write down the stop time. 6. If you do not feel 10 movements in 2 hours, contact your health care provider for further instructions. Your health care provider may want to do additional tests to assess your baby's well-being. Contact a health care provider if:  You feel fewer than 10 movements in 2 hours.  Your baby is not moving like he or she usually does. Date: ____________ Start time: ____________ Stop time: ____________ Movements: ____________ Date: ____________ Start time: ____________ Stop time: ____________ Movements: ____________ Date: ____________  Start time: ____________ Stop time: ____________ Movements: ____________ Date: ____________ Start time: ____________ Stop time: ____________ Movements: ____________ Date: ____________ Start time: ____________ Stop time: ____________ Movements: ____________ Date: ____________ Start time: ____________ Stop time: ____________ Movements: ____________ Date: ____________ Start time: ____________ Stop time: ____________ Movements: ____________ Date: ____________ Start time: ____________ Stop time: ____________ Movements: ____________ Date: ____________ Start time: ____________ Stop time: ____________ Movements: ____________ This information is not intended to replace advice given to you by your health care provider. Make sure you discuss any questions you have with your health care provider. Document Revised: 02/04/2019 Document Reviewed: 02/04/2019 Elsevier Patient Education  2021 Elsevier Inc.  

## 2020-07-16 ENCOUNTER — Observation Stay
Admission: EM | Admit: 2020-07-16 | Discharge: 2020-07-17 | Disposition: A | Discharge status: Home / Self Care | Payer: BC Managed Care – PPO | Attending: Obstetrics and Gynecology | Admitting: Obstetrics and Gynecology

## 2020-07-16 ENCOUNTER — Other Ambulatory Visit: Payer: Self-pay

## 2020-07-16 DIAGNOSIS — Z3A38 38 weeks gestation of pregnancy: Secondary | ICD-10-CM | POA: Insufficient documentation

## 2020-07-16 DIAGNOSIS — O471 False labor at or after 37 completed weeks of gestation: Secondary | ICD-10-CM

## 2020-07-16 NOTE — OB Triage Note (Signed)
Pt is G5P3 and arrives for ctx approximately 10 minutes apart x 3 hours. Pt reports losing mucous plug yesterday but states that no fluid is leaking or blood at this time. Pt is [facility] pt.

## 2020-07-17 DIAGNOSIS — O471 False labor at or after 37 completed weeks of gestation: Secondary | ICD-10-CM | POA: Diagnosis not present

## 2020-07-17 DIAGNOSIS — Z3A38 38 weeks gestation of pregnancy: Secondary | ICD-10-CM

## 2020-07-18 DIAGNOSIS — O471 False labor at or after 37 completed weeks of gestation: Secondary | ICD-10-CM

## 2020-07-18 NOTE — Discharge Summary (Signed)
Obstetric Discharge Summary  Patient ID: Teresa Ortiz MRN: [id number] DOB/AGE: [birth year] 30 y.o.   Date of Admission: 07/16/2020  Date of Discharge: 07/17/2020  Admitting Diagnosis: Observation at [redacted]w[redacted]d  Secondary Diagnosis:   Patient Active Problem List   Diagnosis Date Noted  . Pregnant 06/29/2020  . Indication for care in labor and delivery, antepartum 06/29/2020  . E. coli UTI 02/14/2020  . B12 deficiency anemia 05/01/2018  . Pregnancy 01/30/2018  . Migraine without aura 05/11/2014       Discharge Diagnosis: No other diagnosis   Antepartum Procedures: NST    Brief Hospital Course   L&D OB Triage Note  Teresa Ortiz is a 30 y.o. O0H2122 female at [redacted]w[redacted]d, EDD Estimated Date of Delivery: 07/25/20 who presented to triage for complaints of irregular contractions.  She was evaluated by the nurses with no significant findings for fetal/maternal distress or labor. Vital signs stable. An NST was performed and has been reviewed by CNM.   NST INTERPRETATION: Indications: rule out uterine contractions  Mode: External Baseline Rate (A): 120 bpm Variability: Moderate Accelerations: 15 x 15 Decelerations: None Contraction Frequency (min): UI  Impression: reactive  Dilation: 3 Effacement (%): 50 Cervical Position: Posterior Station: Ballotable Presentation: Vertex Exam by:: Becky, RN  Plan: NST performed was reviewed and was found to be reactive. She was discharged home with bleeding/labor precautions.  Continue routine prenatal care. Follow up with CNM as previously scheduled.    Discharge Instructions: Per After Visit Summary.  Activity:  Also refer to After Visit Summary  Diet: Regular  Medications: Allergies as of 07/17/2020      Reactions   Sulfa Antibiotics Rash      Medication List    ASK your doctor about these medications   Fusion Plus Caps Take 1 tablet by mouth daily.   multivitamin-prenatal 27-0.8 MG Tabs tablet Take 1 tablet by mouth  daily at 12 noon.      Outpatient follow up: As previously scheduled or sooner if needed.   Postpartum contraception: bilateral tubal ligation  Discharged Condition: stable  Discharged to: home   Serafina Royals, CNM  [facility], [facility]

## 2020-07-20 ENCOUNTER — Other Ambulatory Visit: Payer: Self-pay

## 2020-07-20 ENCOUNTER — Other Ambulatory Visit: Payer: BC Managed Care – PPO

## 2020-07-20 ENCOUNTER — Telehealth: Payer: Self-pay

## 2020-07-20 DIAGNOSIS — O480 Post-term pregnancy: Secondary | ICD-10-CM

## 2020-07-20 DIAGNOSIS — Z20822 Contact with and (suspected) exposure to covid-19: Secondary | ICD-10-CM

## 2020-07-20 NOTE — Telephone Encounter (Signed)
Called pt to get her on the schedule for a u/s the pt stated that he due date is the 65 and that she also has been around someone who tested positive for Covid.

## 2020-07-22 LAB — NOVEL CORONAVIRUS, NAA: SARS-CoV-2, NAA: DETECTED — AB

## 2020-07-22 LAB — SARS-COV-2, NAA 2 DAY TAT

## 2020-07-24 ENCOUNTER — Inpatient Hospital Stay
Admission: EM | Admit: 2020-07-24 | Discharge: 2020-07-26 | DRG: 796 | Disposition: A | Discharge status: Home / Self Care | Payer: BC Managed Care – PPO | Attending: Certified Nurse Midwife | Admitting: Certified Nurse Midwife

## 2020-07-24 ENCOUNTER — Encounter: Payer: Self-pay | Admitting: Certified Nurse Midwife

## 2020-07-24 ENCOUNTER — Ambulatory Visit (INDEPENDENT_AMBULATORY_CARE_PROVIDER_SITE_OTHER): Payer: BC Managed Care – PPO | Admitting: Certified Nurse Midwife

## 2020-07-24 ENCOUNTER — Other Ambulatory Visit: Payer: Self-pay

## 2020-07-24 DIAGNOSIS — U071 COVID-19: Secondary | ICD-10-CM | POA: Diagnosis not present

## 2020-07-24 DIAGNOSIS — Z3A39 39 weeks gestation of pregnancy: Secondary | ICD-10-CM

## 2020-07-24 DIAGNOSIS — Z3483 Encounter for supervision of other normal pregnancy, third trimester: Secondary | ICD-10-CM

## 2020-07-24 DIAGNOSIS — O9902 Anemia complicating childbirth: Secondary | ICD-10-CM | POA: Diagnosis present

## 2020-07-24 DIAGNOSIS — O26893 Other specified pregnancy related conditions, third trimester: Secondary | ICD-10-CM | POA: Diagnosis present

## 2020-07-24 DIAGNOSIS — Z302 Encounter for sterilization: Secondary | ICD-10-CM

## 2020-07-24 DIAGNOSIS — Z3490 Encounter for supervision of normal pregnancy, unspecified, unspecified trimester: Secondary | ICD-10-CM

## 2020-07-24 DIAGNOSIS — O9852 Other viral diseases complicating childbirth: Secondary | ICD-10-CM | POA: Diagnosis present

## 2020-07-24 DIAGNOSIS — D519 Vitamin B12 deficiency anemia, unspecified: Secondary | ICD-10-CM | POA: Diagnosis present

## 2020-07-24 DIAGNOSIS — Z8616 Personal history of COVID-19: Secondary | ICD-10-CM | POA: Diagnosis present

## 2020-07-24 LAB — CBC
HCT: 33.2 % — ABNORMAL LOW (ref 36.0–46.0)
Hemoglobin: 10.5 g/dL — ABNORMAL LOW (ref 12.0–15.0)
MCH: 24.5 pg — ABNORMAL LOW (ref 26.0–34.0)
MCHC: 31.6 g/dL (ref 30.0–36.0)
MCV: 77.6 fL — ABNORMAL LOW (ref 80.0–100.0)
Platelets: 249 10*3/uL (ref 150–400)
RBC: 4.28 MIL/uL (ref 3.87–5.11)
RDW: 18.7 % — ABNORMAL HIGH (ref 11.5–15.5)
WBC: 14.1 10*3/uL — ABNORMAL HIGH (ref 4.0–10.5)
nRBC: 0 % (ref 0.0–0.2)

## 2020-07-24 LAB — TYPE AND SCREEN
ABO/RH(D): O POS
Antibody Screen: NEGATIVE

## 2020-07-24 MED ORDER — SIMETHICONE 80 MG PO CHEW: 80.0000 mg | CHEWABLE_TABLET | ORAL | Status: DC | PRN

## 2020-07-24 MED ORDER — COCONUT OIL OIL: 1.0000 "application " | TOPICAL_OIL | Status: DC | PRN

## 2020-07-24 MED ORDER — METHYLERGONOVINE MALEATE 0.2 MG/ML IJ SOLN: 0.2000 mg | INTRAMUSCULAR | Status: DC | PRN

## 2020-07-24 MED ORDER — METHYLERGONOVINE MALEATE 0.2 MG PO TABS
0.2000 mg | ORAL_TABLET | ORAL | Status: DC | PRN
  Filled 2020-07-24: qty 1

## 2020-07-24 MED ORDER — SENNOSIDES-DOCUSATE SODIUM 8.6-50 MG PO TABS
2.0000 | ORAL_TABLET | ORAL | Status: DC
  Administered 2020-07-24 – 2020-07-25 (×2): 2 via ORAL
  Filled 2020-07-24 (×2): qty 2

## 2020-07-24 MED ORDER — SODIUM CHLORIDE 0.9% FLUSH: 3.0000 mL | INTRAVENOUS | Status: DC | PRN

## 2020-07-24 MED ORDER — BUTORPHANOL TARTRATE 1 MG/ML IJ SOLN
1.0000 mg | INTRAMUSCULAR | Status: DC | PRN
Start: 2020-07-24 — End: 2020-07-24

## 2020-07-24 MED ORDER — ONDANSETRON HCL 4 MG PO TABS
4.0000 mg | ORAL_TABLET | ORAL | Status: DC | PRN
  Filled 2020-07-24: qty 1

## 2020-07-24 MED ORDER — ONDANSETRON HCL 4 MG/2ML IJ SOLN: 4.0000 mg | Freq: Four times a day (QID) | INTRAMUSCULAR | Status: DC | PRN

## 2020-07-24 MED ORDER — LIDOCAINE HCL (PF) 1 % IJ SOLN: 30.0000 mL | INTRAMUSCULAR | Status: DC | PRN

## 2020-07-24 MED ORDER — SODIUM CHLORIDE 0.9% FLUSH
3.0000 mL | Freq: Two times a day (BID) | INTRAVENOUS | Status: DC
  Administered 2020-07-25: 3 mL via INTRAVENOUS

## 2020-07-24 MED ORDER — ACETAMINOPHEN 325 MG PO TABS
650.0000 mg | ORAL_TABLET | ORAL | Status: DC | PRN
  Administered 2020-07-24 – 2020-07-25 (×2): 650 mg via ORAL
  Filled 2020-07-24 (×2): qty 2

## 2020-07-24 MED ORDER — MISOPROSTOL 200 MCG PO TABS
ORAL_TABLET | ORAL | Status: AC
  Filled 2020-07-24: qty 4

## 2020-07-24 MED ORDER — BENZOCAINE-MENTHOL 20-0.5 % EX AERO
1.0000 "application " | INHALATION_SPRAY | CUTANEOUS | Status: DC | PRN
  Filled 2020-07-24: qty 56

## 2020-07-24 MED ORDER — OXYCODONE-ACETAMINOPHEN 5-325 MG PO TABS: 2.0000 | ORAL_TABLET | ORAL | Status: DC | PRN

## 2020-07-24 MED ORDER — METOCLOPRAMIDE HCL 10 MG PO TABS
10.0000 mg | ORAL_TABLET | Freq: Once | ORAL | Status: AC
  Administered 2020-07-25: 10 mg via ORAL
  Filled 2020-07-24 (×2): qty 1

## 2020-07-24 MED ORDER — IBUPROFEN 600 MG PO TABS
600.0000 mg | ORAL_TABLET | Freq: Four times a day (QID) | ORAL | Status: DC
  Administered 2020-07-24 – 2020-07-26 (×7): 600 mg via ORAL
  Filled 2020-07-24 (×8): qty 1

## 2020-07-24 MED ORDER — ACETAMINOPHEN 325 MG PO TABS: 650.0000 mg | ORAL_TABLET | ORAL | Status: DC | PRN

## 2020-07-24 MED ORDER — WITCH HAZEL-GLYCERIN EX PADS
1.0000 "application " | MEDICATED_PAD | CUTANEOUS | Status: DC | PRN
  Filled 2020-07-24: qty 100

## 2020-07-24 MED ORDER — DIPHENHYDRAMINE HCL 25 MG PO CAPS: 25.0000 mg | ORAL_CAPSULE | Freq: Four times a day (QID) | ORAL | Status: DC | PRN

## 2020-07-24 MED ORDER — OXYCODONE-ACETAMINOPHEN 5-325 MG PO TABS: 1.0000 | ORAL_TABLET | ORAL | Status: DC | PRN

## 2020-07-24 MED ORDER — DIBUCAINE (PERIANAL) 1 % EX OINT: 1.0000 "application " | TOPICAL_OINTMENT | CUTANEOUS | Status: DC | PRN

## 2020-07-24 MED ORDER — LIDOCAINE HCL (PF) 1 % IJ SOLN
INTRAMUSCULAR | Status: AC
  Filled 2020-07-24: qty 30

## 2020-07-24 MED ORDER — FAMOTIDINE 20 MG PO TABS
40.0000 mg | ORAL_TABLET | Freq: Once | ORAL | Status: AC
  Administered 2020-07-24: 40 mg via ORAL
  Filled 2020-07-24: qty 2

## 2020-07-24 MED ORDER — OXYTOCIN 10 UNIT/ML IJ SOLN
INTRAMUSCULAR | Status: AC
  Administered 2020-07-24: 10 [IU]
  Filled 2020-07-24: qty 2

## 2020-07-24 MED ORDER — SODIUM CHLORIDE 0.9 % IV SOLN: 250.0000 mL | INTRAVENOUS | Status: DC | PRN

## 2020-07-24 MED ORDER — SOD CITRATE-CITRIC ACID 500-334 MG/5ML PO SOLN: 30.0000 mL | ORAL | Status: DC | PRN

## 2020-07-24 MED ORDER — LACTATED RINGERS IV SOLN: INTRAVENOUS | Status: DC

## 2020-07-24 MED ORDER — AMMONIA AROMATIC IN INHA
RESPIRATORY_TRACT | Status: AC
  Filled 2020-07-24: qty 10

## 2020-07-24 MED ORDER — ONDANSETRON HCL 4 MG/2ML IJ SOLN: 4.0000 mg | INTRAMUSCULAR | Status: DC | PRN

## 2020-07-24 MED ORDER — PRENATAL MULTIVITAMIN CH
1.0000 | ORAL_TABLET | Freq: Every day | ORAL | Status: DC
  Administered 2020-07-25 – 2020-07-26 (×2): 1 via ORAL
  Filled 2020-07-24 (×2): qty 1

## 2020-07-24 NOTE — Progress Notes (Signed)
Virtual Visit via Telephone Note  I connected with Reizy L Grimley on 07/24/20 at 11:00 AM EST by telephone and verified that I am speaking with the correct person using two identifiers.  Location: Patient: at home Provider: in th office  I discussed the limitations, risks, security and privacy concerns of performing an evaluation and management service by telephone and the availability of in person appointments. I also discussed with the patient that there may be a patient responsible charge related to this service. The patient expressed understanding and agreed to proceed.   History of Present Illness: 30 yr old G70P3013 covid positive on 1/22   Observations/Objective: States her symptoms are resolving , the headaches have stopped and her cough is lessening. She feels good fetal movement. State she has episodes of braxton hicks cts. Discussed u/s 1/26 for AFI and growth. She feels good fetal movement, denies fever, and SOB.   Assessment and Plan: Reviewed red flag symptoms : decreased fetal movement, fever greater than 100.4, SOB , pt instructed to go to the hospital for labor or any red flag symptoms. She verbalizes and agrees. Discussed induction 41 wks if labor does not occur spontaneously. Will work on scheduling.   Follow Up Instructions: As scheduled for u/s .   I discussed the assessment and treatment plan with the patient. The patient was provided an opportunity to ask questions and all were answered. The patient agreed with the plan and demonstrated an understanding of the instructions.   The patient was advised to call back or seek an in-person evaluation if the symptoms worsen or if the condition fails to improve as anticipated.  I provided 10 minutes of non-face-to-face time during this encounter.   Doreene Burke, CNM

## 2020-07-24 NOTE — Progress Notes (Signed)
Received transferred call from Ardelle Lesches for a televisit due to positive COVID test. DOB as identifier. Patient states she does not feel bad. Has a slight cough at night. Has good fetal movement. States she is" hurting". Does not have access to BP cuff or scale. Call transferred to Doreene Burke CNM for completion of visit.

## 2020-07-24 NOTE — Patient Instructions (Signed)
Pregnancy and COVID-19 Pregnant women and women who were recently pregnant are at an increased risk for severe illness from COVID-19. Other conditions, such as being pregnant at an older age or having diabetes or obesity, can further increase the risk of severe illness from COVID-19. This risk can last for at least 42 days following the end of the pregnancy. Protect yourself and your baby by:  Knowing your risk factors. Ask your health care provider about your specific risk factors.  Working with your health care team to protect yourself against all infections, including COVID-19. How does COVID-19 affect me? If you get COVID-19 while pregnant or shortly after your pregnancy, there is an increased risk that you may:  Get a respiratory illness that can lead to pneumonia or severe illness.  Give birth to your baby before 37 weeks of pregnancy (preterm birth).  Have other complications that can affect your pregnancy. How does COVID-19 affect my care? If you have COVID-19, special precautions will be taken around your pregnancy:  You will have to notify the clinic or hospital before a visit. Steps will be taken to protect other people from the virus, including seeing you in a special room.  Tests and scans may be done differently before delivery (prenatal care).  Your birth plan may change, including what room you will be in and who may be with you during labor and delivery.  You may stay longer in the hospital after delivery (postpartum care).  COVID-19 will affect where your baby will stay after delivery. Ask about the risks and benefits of staying in the same room with your baby. Benefits include breastfeeding and mother-newborn bonding.  You may have to feed your baby differently.  Visitors will be limited after your baby is born. How does COVID-19 affect my baby? It is very rare for a mother with COVID-19 to pass the virus to the unborn baby. After birth, a baby can get the virus if  he or she is exposed to it. Ask your health care provider about ways to protect your baby. The baby can be placed in an incubator. A physical barrier can also be used. What can I do to lower my risk? Medicines and vaccines  You can receive a COVID-19 vaccination. This can protect you from severe illness. If you have concerns, talk to your health care provider.  Get other recommended vaccines, including the flu vaccine and the whooping cough (Tdap) vaccine.  Ask your health care provider if you can get a 30-day, or longer, supply of your medicines, so you can make fewer trips to the pharmacy.  If you have received a COVID-19 vaccine, consider enrolling in the v-safe program from the CDC. This program uses an app on your smartphone to provide check-ins and gather information on your health after you receive the vaccine. There is a separate registry for pregnant women. For more information, visit: ? V-safe tool: http://boyd.org/ ? V-safe pregnancy registry: Superior[employer].be Cleaning and personal hygiene If you are in isolation for COVID-19 and are sharing a room with your newborn, take these steps to reduce the risk of spreading the virus to your newborn:  Wash your hands with soap and water for at least 20 seconds before holding or caring for your baby. If soap and water are not available, use alcohol-based hand sanitizer.  Wear a mask when within 6 feet (1.8 m) of your baby.  Keep your baby more than 6 feet (1.8 m) away from you as much as possible.  Avoid touching your mouth, face, eyes, or nose before washing your hands.  Clean and disinfect objects and surfaces that are frequently touched.   Other things to do  Avoid people who might have been exposed to or infected with COVID-19, including people who live with you.  Cover your mouth and nose by wearing a mask or other cloth covering over your face when you go out in public.  Avoid people who are not wearing  a mask.  Avoid large crowds. Maintain at least 6 feet (1.8 m) between yourself and others.  Avoid poorly ventilated spaces.  Call your health care provider if you have any health concerns. ? Contact your health care provider right away if you think you have COVID-19. Tell your health care provider that you think you may have a COVID-19 infection and that you are pregnant. Breastfeeding tips Plan with your family and health care team how to feed your baby. Current research shows that the virus may not pass to a baby through breast milk. If you are breastfeeding, you can receive a COVID-19 vaccine. The vaccines pose no risk for breastfeeding mothers or their babies.  Some of the vaccines might create antibodies in breast milk. These antibodies can help to protect your baby. Take precautions if you have or may have COVID-19. Precautions include:  Washing hands with soap and water for at least 20 seconds before feeding your baby. If soap and water are not available, use alcohol-based hand sanitizer.  Wearing a mask while feeding your baby.  Pumping or expressing breast milk to feed to your baby. If possible, ask someone in your household who is not sick to feed your baby the expressed breast milk. ? Wash your hands with soap and water for at least 20 seconds before touching pump parts. ? Wash and disinfect all pump parts after expressing milk. Follow the manufacturer's instructions to clean and disinfect all pump parts. Follow these instructions: Managing stress Some pregnant and postpartum women may have fear, uncertainty, and stress because of COVID-19. Find ways to manage stress. These may include:  Using relaxation techniques such as meditation and deep breathing.  Getting regular exercise. Most women can continue their usual exercise routine during pregnancy. Ask your health care provider what activities are safe for you.  Seeking support from family, friends, or spiritual resources.  If you cannot be together in person, you can still connect by phone calls, texts, video calls, or online messaging.  Doing relaxing activities that you enjoy, such as listening to music or reading a good book. General instructions  Follow your health care provider's instructions on taking medicines. Some medicines may not be safe to take during pregnancy.  Ask for help if you have counseling or nutritional needs. Your health care provider can offer advice or refer you to resources or specialists who can help you with various needs.  Keep all follow-up visits. This is important. This includes visits before and after you have your baby. Questions to ask your health care team  What should I do if I have COVID-19 symptoms?  What are the side effects that can occur after receiving any of the available COVID-19 vaccines?  How will COVID-19 affect my prenatal care visits, tests and scans, labor and delivery, and postpartum care?  What are the risks of COVID-19 to me and the potential risks to my unborn baby or infant?  How do vaccines pass antibodies to my unborn baby?  Should I plan to breastfeed my baby?  Where can I find mental health resources?  Where can I find support if I have financial concerns? Where to find more information  CDC: www.cdc.gov/pregnant-people.html  World Health Organization (WHO): www.who.int/pregnancy-and-childbirth  American College of Obstetricians and Gynecologists (ACOG): www.acog.org Contact a health care provider if:  You have signs and symptoms of infection, including a fever or cough. ? Tell your health care team that you think you may have a COVID-19 infection and that you are pregnant.  You have strong emotions, such as sadness or anxiety.  You feel unsafe in your home and need help finding a safe place to live.  You have bloody or watery vaginal discharge or vaginal bleeding. Get help right away if:  You have signs or symptoms of labor  before 37 weeks of pregnancy. These include: ? Contractions that are 5 minutes or less apart, or that increase in frequency, intensity, or length. ? Sudden, sharp pain in the abdomen or in the lower back. ? A gush or trickle of fluid from your vagina.  You have signs of more serious illness, such as: ? Trouble breathing. ? Chest pain. ? A fever of 102.2F (39C) or higher that does not go away. ? Vomiting every time you drink fluids. ? Feeling extremely weak. ? Fainting. These symptoms may represent a serious problem that is an emergency. Do not wait to see if the symptoms will go away. Get medical help right away. Call your local emergency services (911 in the [place] Do not drive yourself to the hospital. Summary  Pregnant women and women who were recently pregnant are at an increased risk for severe illness from COVID-19.  Take precautions to protect yourself and your baby. Wear a mask. Wash hands often. Avoid touching your mouth, face, eyes, or nose before washing hands. Avoid large groups of people and stay away from people who are sick.  If you think you have a COVID-19 infection, contact your health care provider right away. Tell your health care provider that you think you have COVID-19 and that you are pregnant.  If you have COVID-19, special precautions may be taken during pregnancy, labor and delivery, and after delivery. This information is not intended to replace advice given to you by your health care provider. Make sure you discuss any questions you have with your health care provider. Document Revised: 05/01/2020 Document Reviewed: 05/01/2020 Elsevier Patient Education  2021 Elsevier Inc.  

## 2020-07-24 NOTE — H&P (Addendum)
Obstetric History and Physical  Teresa Ortiz is a 30 y.o. K3T4656 with IUP at [redacted]w[redacted]d presenting with regular contractions since 1745.   Patient states she has been having  regular, every two (2) minutes contractions, none vaginal bleeding, intact membranes, with active fetal movement.    Denies difficulty breathing or respiratory distress, chest pain, dysuria, and leg pain or swelling.   Prenatal Course  Source of Care: [facility]-initial visit at 10 weeks, total visits: 12  Pregnancy complications or risks: B-12 deficiency anemia, COVID positive, Rh positive, History UTI  Prenatal labs and studies:  ABO, Rh: O/Positive/-- Jan 20, 2023 1149)  Antibody: Negative 01-20-23 1149)  Varicella: 781 01-20-2023 1149)  Rubella: 1.24 Jan 20, 2023 1149)  RPR: Non Reactive (11/09 1013)   HBsAg: Negative (06/21 1118)   HIV: Non Reactive (06/21 1118)   CLE:XNTZGYFV/-- (12/28 1526)  1 hr Glucola: 86 (11/09 1013)  Genetic screening: Low risk female (07/19 4944)  Anatomy US: Complete, normal (09/21 1024)  Past Medical History:  Diagnosis Date  . Asthma    childhood  . Frequent headaches   . Migraine without aura     Past Surgical History:  Procedure Laterality Date  . DILATION AND EVACUATION N/A 07/16/2017   Procedure: DILATATION AND EVACUATION;  Surgeon: Linzie Collin, MD;  Location: [facility] OR[facility];  Service: Gynecology;  Laterality: N/A;  . EXTRACORPOREAL SHOCK WAVE LITHOTRIPSY Left 07/01/2019   Procedure: EXTRACORPOREAL SHOCK WAVE LITHOTRIPSY (ESWL);  Surgeon: Sondra Come, MD;  Location: [facility] OR[facility];  Service: Urology;  Laterality: Left;    OB History  Gravida Para Term Preterm AB Living  5 3 3   1 3   SAB IAB Ectopic Multiple Live Births  1     0 3    # Outcome Date GA Lbr Len/2nd Weight Sex Delivery Anes PTL Lv  5 Current           4 Term 05/15/18 [redacted]w[redacted]d / 03:41 3100 g F Vag-Spont EPI  LIV  3 SAB 2019          2 Term 06/01/16 [redacted]w[redacted]d / 00:14 2750 g M Vag-Spont EPI  LIV  1 Term 2011   2903 g  F Vag-Spont   LIV    Social History   Socioeconomic History  . Marital status: Single    Spouse name: Not on file  . Number of children: Not on file  . Years of education: Not on file  . Highest education level: Not on file  Occupational History  . Not on file  Tobacco Use  . Smoking status: Never Smoker  . Smokeless tobacco: Never Used  Vaping Use  . Vaping Use: Never used  Substance and Sexual Activity  . Alcohol use: No    Alcohol/week: 0.0 standard drinks    Comment: rarely  . Drug use: No  . Sexual activity: Yes    Birth control/protection: None  Other Topics Concern  . Not on file  Social History Narrative   Social Determinants of Health   Financial Resource Strain: Not on file  Food Insecurity: Not on file  Transportation Needs: Not on file  Physical Activity: Not on file  Stress: Not on file  Social Connections: Not on file    Family History  Problem Relation Age of Onset  . Mental illness Mother        bipolor/depressed  . Cervical cancer Mother   . Alcohol abuse Father     Medications Prior to Admission  Medication Sig Dispense Refill Last Dose  .  Iron-FA-B Cmp-C-Biot-Probiotic (FUSION PLUS) CAPS Take 1 tablet by mouth daily. 30 capsule 8   . Prenatal Vit-Fe Fumarate-FA (MULTIVITAMIN-PRENATAL) 27-0.8 MG TABS tablet Take 1 tablet by mouth daily at 12 noon.       Allergies  Allergen Reactions  . Sulfa Antibiotics Rash    Review of Systems: Negative except for what is mentioned in HPI.  Physical Exam:  BP (!) 88/54 Comment: pt feeding  Pulse 88   Temp 97.7 F (36.5 C) (Oral)   Resp 18   Ht 5\' 10"  (1.778 m)   Wt 99.3 kg   LMP 10/19/2019 (Exact Date)   BMI 31.42 kg/m   GENERAL: Well-developed, well-nourished female in no acute distress.   LUNGS: Clear to auscultation bilaterally.   HEART: Regular rate and rhythm.  ABDOMEN: Soft, nontender, nondistended, gravid.  EXTREMITIES: Nontender, no edema, 2+ distal pulses.  Cervical Exam:  Dilation: Lip/rim Exam by:: Oxley CNM   FHR Category I  Contractions: Every one (1) to three (3) minutes; soft resting tone   Pertinent Labs/Studies:   No results found for this or any previous visit (from the past 24 hour(s)).  Assessment :  Teresa Ortiz is a 30 y.o. [phone] at [redacted]w[redacted]d being admitted for labor, Rh positive, GBS negative  FHR Category I  Plan:  Admit to birthing suites, see orders.   Labor: Expectant management.    Delivery plan: Hopeful for vaginal birth.   Dr. [redacted]w[redacted]d notified of admission and plan of care.   Room prepared for second stage.    Valentino Saxon, CNM [facility], [facility] 07/24/20 9:04 PM

## 2020-07-25 ENCOUNTER — Inpatient Hospital Stay: Payer: BC Managed Care – PPO | Admitting: Anesthesiology

## 2020-07-25 ENCOUNTER — Encounter
Admission: EM | Disposition: A | Discharge status: Home / Self Care | Payer: Self-pay | Source: Home / Self Care | Attending: Certified Nurse Midwife

## 2020-07-25 ENCOUNTER — Inpatient Hospital Stay: Admit: 2020-07-25 | Payer: Self-pay

## 2020-07-25 ENCOUNTER — Telehealth: Payer: Self-pay

## 2020-07-25 ENCOUNTER — Encounter: Payer: Self-pay | Admitting: Certified Nurse Midwife

## 2020-07-25 DIAGNOSIS — Z302 Encounter for sterilization: Secondary | ICD-10-CM | POA: Diagnosis not present

## 2020-07-25 HISTORY — PX: TUBAL LIGATION: SHX77

## 2020-07-25 LAB — CBC
HCT: 32 % — ABNORMAL LOW (ref 36.0–46.0)
Hemoglobin: 10.2 g/dL — ABNORMAL LOW (ref 12.0–15.0)
MCH: 24.9 pg — ABNORMAL LOW (ref 26.0–34.0)
MCHC: 31.9 g/dL (ref 30.0–36.0)
MCV: 78 fL — ABNORMAL LOW (ref 80.0–100.0)
Platelets: 237 10*3/uL (ref 150–400)
RBC: 4.1 MIL/uL (ref 3.87–5.11)
RDW: 18.8 % — ABNORMAL HIGH (ref 11.5–15.5)
WBC: 13.2 10*3/uL — ABNORMAL HIGH (ref 4.0–10.5)
nRBC: 0 % (ref 0.0–0.2)

## 2020-07-25 LAB — RPR: RPR Ser Ql: NONREACTIVE

## 2020-07-25 SURGERY — LIGATION, FALLOPIAN TUBE, POSTPARTUM
Anesthesia: Spinal | Laterality: Bilateral

## 2020-07-25 MED ORDER — DEXAMETHASONE SODIUM PHOSPHATE 10 MG/ML IJ SOLN
INTRAMUSCULAR | Status: DC | PRN
  Administered 2020-07-25: 10 mg via INTRAVENOUS

## 2020-07-25 MED ORDER — BUPIVACAINE IN DEXTROSE 0.75-8.25 % IT SOLN
INTRATHECAL | Status: DC | PRN
  Administered 2020-07-25: 1.7 mL via INTRATHECAL

## 2020-07-25 MED ORDER — MIDAZOLAM HCL 2 MG/2ML IJ SOLN
INTRAMUSCULAR | Status: AC
  Filled 2020-07-25: qty 2

## 2020-07-25 MED ORDER — DEXMEDETOMIDINE (PRECEDEX) IN NS 20 MCG/5ML (4 MCG/ML) IV SYRINGE
PREFILLED_SYRINGE | INTRAVENOUS | Status: DC | PRN
  Administered 2020-07-25 (×3): 4 ug via INTRAVENOUS

## 2020-07-25 MED ORDER — EPHEDRINE SULFATE 50 MG/ML IJ SOLN
INTRAMUSCULAR | Status: DC | PRN
  Administered 2020-07-25 (×2): 5 mg via INTRAVENOUS

## 2020-07-25 MED ORDER — MIDAZOLAM HCL 5 MG/5ML IJ SOLN
INTRAMUSCULAR | Status: DC | PRN
  Administered 2020-07-25: 2 mg via INTRAVENOUS

## 2020-07-25 MED ORDER — DEXMEDETOMIDINE (PRECEDEX) IN NS 20 MCG/5ML (4 MCG/ML) IV SYRINGE
PREFILLED_SYRINGE | INTRAVENOUS | Status: AC
  Filled 2020-07-25: qty 5

## 2020-07-25 MED ORDER — OXYCODONE HCL 5 MG PO TABS
5.0000 mg | ORAL_TABLET | ORAL | Status: DC | PRN
  Administered 2020-07-25 – 2020-07-26 (×3): 5 mg via ORAL
  Filled 2020-07-25 (×3): qty 1

## 2020-07-25 MED ORDER — EPHEDRINE 5 MG/ML INJ
INTRAVENOUS | Status: AC
  Filled 2020-07-25: qty 10

## 2020-07-25 MED ORDER — LIDOCAINE HCL (CARDIAC) PF 100 MG/5ML IV SOSY
PREFILLED_SYRINGE | INTRAVENOUS | Status: DC | PRN
  Administered 2020-07-25: 60 mg via INTRATRACHEAL

## 2020-07-25 MED ORDER — LACTATED RINGERS IV SOLN
INTRAVENOUS | Status: DC | PRN
Start: 2020-07-25 — End: 2020-07-25

## 2020-07-25 MED ORDER — BUPIVACAINE HCL 0.5 % IJ SOLN
INTRAMUSCULAR | Status: DC | PRN
  Administered 2020-07-25: 16 mL

## 2020-07-25 MED ORDER — ONDANSETRON HCL 4 MG/2ML IJ SOLN
INTRAMUSCULAR | Status: DC | PRN
  Administered 2020-07-25: 4 mg via INTRAVENOUS

## 2020-07-25 SURGICAL SUPPLY — 25 items
BLADE SURG SZ11 CARB STEEL (BLADE) ×2 IMPLANT
CHLORAPREP W/TINT 26 (MISCELLANEOUS) ×2 IMPLANT
COVER WAND RF STERILE (DRAPES) IMPLANT
DERMABOND ADVANCED (GAUZE/BANDAGES/DRESSINGS) ×1
DERMABOND ADVANCED .7 DNX12 (GAUZE/BANDAGES/DRESSINGS) ×1 IMPLANT
DRAPE LAPAROTOMY 100X77 ABD (DRAPES) ×2 IMPLANT
GLOVE INDICATOR 7.0 STRL GRN (GLOVE) ×2 IMPLANT
GLOVE SURG ENC MOIS LTX SZ6.5 (GLOVE) ×2 IMPLANT
GOWN STRL REUS W/ TWL LRG LVL3 (GOWN DISPOSABLE) ×2 IMPLANT
GOWN STRL REUS W/TWL LRG LVL3 (GOWN DISPOSABLE) ×2
KIT TURNOVER CYSTO (KITS) ×2 IMPLANT
MANIFOLD NEPTUNE II (INSTRUMENTS) ×2 IMPLANT
NEEDLE HYPO 25GX1X1/2 BEV (NEEDLE) ×2 IMPLANT
NS IRRIG 500ML POUR BTL (IV SOLUTION) ×2 IMPLANT
PACK BASIN MINOR ARMC (MISCELLANEOUS) ×2 IMPLANT
SUT MNCRL 4-0 (SUTURE) ×1
SUT MNCRL 4-0 27XMFL (SUTURE) ×1
SUT PLAIN GUT 0 (SUTURE) ×4 IMPLANT
SUT VIC AB 0 CT1 36 (SUTURE) IMPLANT
SUT VIC AB 0 SH 27 (SUTURE) IMPLANT
SUT VIC AB 3-0 SH 27 (SUTURE) ×1
SUT VIC AB 3-0 SH 27X BRD (SUTURE) ×1 IMPLANT
SUT VICRYL 0 AB UR-6 (SUTURE) ×4 IMPLANT
SUTURE MNCRL 4-0 27XMF (SUTURE) ×1 IMPLANT
SYR 10ML LL (SYRINGE) ×2 IMPLANT

## 2020-07-25 NOTE — Transfer of Care (Signed)
Immediate Anesthesia Transfer of Care Note  Patient: Teresa Ortiz  Procedure(s) Performed: POST PARTUM TUBAL LIGATION (Bilateral )  Patient Location: PACU and Mother/Baby  Anesthesia Type:Spinal  Level of Consciousness: awake, alert  and oriented  Airway & Oxygen Therapy: Patient Spontanous Breathing  Post-op Assessment: Report given to RN and Post -op Vital signs reviewed and stable  Post vital signs: Reviewed and stable  Last Vitals:  Vitals Value Taken Time  BP 99/50   Temp    Pulse 68   Resp 12   SpO2 99     Last Pain:  Vitals:   07/25/20 1200  TempSrc: Oral  PainSc:          Complications: No complications documented.

## 2020-07-25 NOTE — Telephone Encounter (Signed)
Called pt lmtrc made pts next two appointments. Made pt a two (2) week TELEVISIT & a six (6) week TELEVISIT with JML.

## 2020-07-25 NOTE — Op Note (Signed)
Procedure(s): POST PARTUM TUBAL LIGATION Procedure Note  Teresa Ortiz female 30 y.o. 07/25/2020  Indications: The patient is a 31 y.o. G76P4014 female with multiparity, desiring permanent sterilization. COVID positive.   Pre-operative Diagnosis: Multiparity, desiring permanent sterilization. COVID positive  Post-operative Diagnosis: Same  Surgeon: [name], MD  Assistants:  None  Anesthesia: Spinal anesthesia   Procedure Details: The patient was seen in the Holding Room. The risks, benefits, complications, treatment options, and expected outcomes were discussed with the patient.  The patient concurred with the proposed plan, giving informed consent.  The site of surgery properly noted/marked. The patient was taken to the Operating Room, identified as Mahlon Gammon and the procedure verified as Procedure(s) (LRB): POST PARTUM TUBAL LIGATION (Bilateral).    She was then placed under spinal anesthesia without difficulty. She was placed in the dorsal supine position, and was prepped and draped in a sterile manner. A Time Out was held and the above information confirmed.   After an adequate timeout was performed, attention was turned to the patient's abdomen local analgesia was administered.  A small transverse skin incision was made under the umbilical fold. The incision was taken down to the layer of fascia using the scalpel, and fascia was incised, and extended bilaterally using Mayo scissors. The peritoneum was entered in a sharp fashion. Attention was then turned to the patient's uterus, and left fallopian tube was identified and followed out to the fimbriated end.  A The Babcock clamp was then used to grasp the tube approximately 4 cm from the cornual region.  A 3 cm segment of tube was then ligated with a free tie of 0-Chromic using the Pomeroy method and excised.  The right fallopian tube was then ligated in a similar fashion and excised. The tubal lumens were cauterized  bilaterally.  Good hemostasis was noted with bilateral fallopian tubes. The instruments were then removed from the patient's abdomen and the fascial incision was repaired with 0 Vicryl, and injected with 5 cc of 0.5% Sensorcaine.  The skin was closed with a 4-0 Vicryl subcuticular stitch. The skin and subcutaneous tissues were injected with 10 cc of 0.5% Sensorcaine.  Dermabond was placed over the incision. The patient tolerated the procedure well.  Instrument, sponge, and needle counts were correct times three.  The patient was then taken to the recovery room awake and in stable condition.   Findings: Uterine fundus 1 fingerbreadth below the umbilicus Fallopian tubes and ovaries appeared normal.   Estimated Blood Loss:  10 ml      Drains: patient voided prior to procedure         Total IV Fluids:  200 ml  Specimens: Segments of bilateral fallopian tubes         Implants: None         Complications:  None; patient tolerated the procedure well.         Disposition: PACU - hemodynamically stable.         Condition: stable   [name], MD [facility]

## 2020-07-25 NOTE — Anesthesia Preprocedure Evaluation (Signed)
Anesthesia Evaluation  Patient identified by MRN, date of birth, ID band Patient awake    Reviewed: Allergy & Precautions, H&P , NPO status , Patient's Chart, lab work & pertinent test results  History of Anesthesia Complications Negative for: history of anesthetic complications  Airway Mallampati: III  TM Distance: >3 FB Neck ROM: full    Dental  (+) Chipped, Poor Dentition   Pulmonary neg shortness of breath, asthma ,           Cardiovascular Exercise Tolerance: Good (-) hypertensionnegative cardio ROS       Neuro/Psych  Headaches,    GI/Hepatic negative GI ROS,   Endo/Other    Renal/GU   negative genitourinary   Musculoskeletal   Abdominal   Peds  Hematology negative hematology ROS (+) anemia ,   Anesthesia Other Findings Past Medical History: No date: Asthma     Comment:  childhood No date: Frequent headaches No date: Migraine without aura  Past Surgical History: 07/16/2017: DILATION AND EVACUATION; N/A     Comment:  Procedure: DILATATION AND EVACUATION;  Surgeon: Linzie Collin, MD;  Location: [facility] ORS;  Service:               Gynecology;  Laterality: N/A;  BMI    Body Mass Index:  39.77 kg/m      Reproductive/Obstetrics (+) Pregnancy                             Anesthesia Physical  Anesthesia Plan  ASA: III  Anesthesia Plan: Spinal   Post-op Pain Management:    Induction: Intravenous  PONV Risk Score and Plan:   Airway Management Planned: Nasal Cannula  Additional Equipment:   Intra-op Plan:   Post-operative Plan:   Informed Consent: I have reviewed the patients History and Physical, chart, labs and discussed the procedure including the risks, benefits and alternatives for the proposed anesthesia with the patient or authorized representative who has indicated his/her understanding and acceptance.       Plan Discussed with:  Anesthesiologist  Anesthesia Plan Comments:         Anesthesia Quick Evaluation

## 2020-07-25 NOTE — Anesthesia Procedure Notes (Signed)
Spinal  Start time: 07/25/2020 1:27 PM End time: 07/25/2020 1:31 PM Staffing Performed: resident/CRNA  Anesthesiologist: Yves Dill, MD Resident/CRNA: Irving Burton, CRNA Preanesthetic Checklist Completed: patient identified, IV checked, site marked, risks and benefits discussed, surgical consent, monitors and equipment checked and pre-op evaluation Spinal Block Patient position: sitting Prep: ChloraPrep and site prepped and draped Patient monitoring: heart rate, continuous pulse ox and blood pressure Approach: midline Location: L3-4 Injection technique: single-shot Needle Needle type: Pencan  Needle gauge: 24 G Needle length: 9 cm

## 2020-07-25 NOTE — Progress Notes (Signed)
Patient ID: Teresa Ortiz, female   DOB: 1990/10/19, 30 y.o.   MRN: [id number]  Post Partum Day # 1, s/p spontaneous vaginal birth, Rh positive, GBS negative, COVID-19 positive, Desires permanent sterilization, Breastfeeding  Subjective:  Patient sitting in bed breastfeeding infant. FOB at bedside for continuous support.   Scheduled for bilateral tubal ligation this afternoon.   Denies difficulty breathing or respiratory distress, chest pain, abdominal pain, excessive vaginal bleeding, dysuria, and leg pain or swelling.   Objective: Temp:  [97.7 F (36.5 C)-98.1 F (36.7 C)] 98.1 F (36.7 C) (01/25 0700) Pulse Rate:  [65-88] 69 (01/25 0700) Resp:  [18] 18 (01/25 0700) BP: (88-121)/(54-82) 117/70 (01/25 0700) SpO2:  [100 %] 100 % (01/25 0700) Weight:  [99.3 kg] 99.3 kg (01/24 2052)  Physical Exam:   General: alert and cooperative   Lungs: clear to auscultation bilaterally  Breasts: normal appearance, no masses or tenderness  Heart: normal apical impulse  Abdomen: soft, non-tender; bowel sounds normal; no masses,  no organomegaly  Pelvis: Lochia: appropriate, Uterine Fundus: firm  Extremities: DVT Evaluation: No evidence of DVT seen on physical exam.  Recent Labs    07/24/20 2142 07/25/20 0519  HGB 10.5* 10.2*  HCT 33.2* 32.0*    Assessment:  30 year old female, G5P4, Post Partum Day # 1, s/p spontaneous vaginal birth, Rh positive, GBS negative, COVID-19 positive, Desires permanent sterilization  Breastfeeding  Plan:  Routine postpartum care and orders.   Reviewed red flag symptoms and when to call.   Continue orders as written. Reassess as needed.   Plan for discharge tomorrow or sooner if desired.     LOS: 1 day   Gunnar Bulla, CNM [facility] 07/25/2020 9:25 AM

## 2020-07-25 NOTE — Progress Notes (Signed)
OBSTETRICS AND GYNECOLOGY PRE-OPERATIVE NOTE   Pre-Op Diagnosis: Multiparity, desiring permanent sterilization, COVID+ status.   Planned Procedure: Postpartum tubal ligation  Surgeons: [name], MD  Anesthesia: Spinal (preferred)  Blood: Type and screened  Exam:  Blood pressure 117/70, pulse 69, temperature 98.1 F (36.7 C), temperature source Oral, resp. rate 18, height 5\' 10"  (1.778 m), weight 99.3 kg, last menstrual period 10/19/2019, SpO2 100 %, unknown if currently breastfeeding.  General appearance: alert and no distress Lungs: clear to auscultation bilaterally Heart: regular rate and rhythm, S1, S2 normal, no murmur, click, rub or gallop Abdomen: normal findings: bowel sounds normal and soft, non-tender.  Fundus firm, at level of umbilicus.  Pelvic: deferred, but appropriate lochia reported.  Extremities: extremities normal, atraumatic, no cyanosis or edema  Labs:  CBC Latest Ref Rng & Units 07/25/2020 07/24/2020 07/04/2020  WBC 4.0 - 10.5 K/uL 13.2(H) 14.1(H) 9.0  Hemoglobin 12.0 - 15.0 g/dL 10.2(L) 10.5(L) 10.0(L)  Hematocrit 36.0 - 46.0 % 32.0(L) 33.2(L) 31.1(L)  Platelets 150 - 400 K/uL 237 249 235    Lab Results  Component Value Date   ABORH O POS 07/24/2020    Antibiotics: None Needed   Consent: Signed and on chart   Pre-Op BTL Consent Consent: Surgical consent and tubal sterilization. Alternatives to sterilizations are documented on the surgical consent that has been signed by the patient.  Patient confirms that she has been counseled about permanent sterilization on mutiple occasions during her antepartum course and during this admission. She understands the alternatives to permanents include: oral contraceptive pills, depot provera, patch, ring, intrauterine device (5 year or 10 year), Implanon, condoms and cervical caps/diaphram. She states that she desires the procedure.  Due to COVID-19 pandemic, patient's procedure although considered elective is deemed  medically necessary for the prevention of future unintended and undesired pregnancies.      07/26/2020, MD [facility]

## 2020-07-26 ENCOUNTER — Ambulatory Visit: Admission: RE | Admit: 2020-07-26 | Payer: BC Managed Care – PPO | Source: Ambulatory Visit

## 2020-07-26 ENCOUNTER — Encounter: Payer: Self-pay | Admitting: Obstetrics and Gynecology

## 2020-07-26 MED ORDER — IBUPROFEN 600 MG PO TABS: 600.0000 mg | ORAL_TABLET | Freq: Four times a day (QID) | ORAL | 0 refills | Status: DC

## 2020-07-26 MED ORDER — OXYCODONE HCL 5 MG PO TABS: 5.0000 mg | ORAL_TABLET | ORAL | 0 refills | Status: DC | PRN

## 2020-07-26 NOTE — Final Progress Note (Signed)
Discharge Day SOAP Note:  Progress Note - Vaginal Delivery  Teresa Ortiz is a 30 y.o. [id number] now PP day 2 s/p Vaginal, Spontaneous . Delivery was uncomplicated  Subjective  The patient has the following complaints: has no unusual complaints  Pain is controlled with current medications.   Patient is urinating without difficulty.  She is ambulating well.     Objective  Vital signs: BP 105/65 (BP Location: Left Arm)   Pulse 81   Temp 97.9 F (36.6 C) (Oral)   Resp 20   Ht 5\' 10"  (1.778 m)   Wt 99.3 kg   LMP 10/19/2019 (Exact Date)   SpO2 100%   Breastfeeding Unknown   BMI 31.42 kg/m   Physical Exam: Gen: NAD Fundus Fundal Tone: Firm  Lochia Amount: Small        Data Review Labs: Lab Results  Component Value Date   WBC 13.2 (H) 07/25/2020   HGB 10.2 (L) 07/25/2020   HCT 32.0 (L) 07/25/2020   MCV 78.0 (L) 07/25/2020   PLT 237 07/25/2020   CBC Latest Ref Rng & Units 07/25/2020 07/24/2020 07/04/2020  WBC 4.0 - 10.5 K/uL 13.2(H) 14.1(H) 9.0  Hemoglobin 12.0 - 15.0 g/dL 10.2(L) 10.5(L) 10.0(L)  Hematocrit 36.0 - 46.0 % 32.0(L) 33.2(L) 31.1(L)  Platelets 150 - 400 K/uL 237 249 235   O POS  Edinburgh Score: Edinburgh Postnatal Depression Scale Screening Tool 07/25/2020  I have been able to laugh and see the funny side of things. 0  I have looked forward with enjoyment to things. 0  I have blamed myself unnecessarily when things went wrong. 1  I have been anxious or worried for no good reason. 2  I have felt scared or panicky for no good reason. 1  Things have been getting on top of me. 1  I have been so unhappy that I have had difficulty sleeping. 0  I have felt sad or miserable. 0  I have been so unhappy that I have been crying. 0  The thought of harming myself has occurred to me. 0  Edinburgh Postnatal Depression Scale Total 5    Assessment/Plan  Active Problems:   Pregnancy   B12 deficiency anemia   Normal labor   COVID-19    Vaginal delivery    Plan for discharge today.  Discharge Instructions: Per After Visit Summary. Activity: Advance as tolerated. Pelvic rest for 6 weeks.  Also refer to After Visit Summary Diet: Regular Medications: Allergies as of 07/26/2020      Reactions   Sulfa Antibiotics Rash      Medication List    TAKE these medications   Fusion Plus Caps Take 1 tablet by mouth daily.   ibuprofen 600 MG tablet Commonly known as: ADVIL Take 1 tablet (600 mg total) by mouth every 6 (six) hours.   multivitamin-prenatal 27-0.8 MG Tabs tablet Take 1 tablet by mouth daily at 12 noon.   oxyCODONE 5 MG immediate release tablet Commonly known as: Oxy IR/ROXICODONE Take 1 tablet (5 mg total) by mouth every 4 (four) hours as needed for moderate pain.      Outpatient follow up: 2 week tele visit , 6 week ppv with 07/28/2020  Postpartum contraception: BTL  Discharged Condition: good  Discharged to: home  Newborn Data: Disposition:home with mother  Apgars: APGAR (1 MIN): 8   APGAR (5 MINS): 9   APGAR (10 MINS):    Baby Feeding: Breast    Marcelino Duster, CNM  07/26/2020 7:49 AM

## 2020-07-26 NOTE — Discharge Summary (Signed)
Patient Name: Teresa Ortiz DOB: 10-May-1991 MRN: [id number]                            Discharge Summary  Date of Admission: 07/24/2020 Date of Discharge: 07/26/2020 Delivering Provider: Holly Bodily MICHELLE   Admitting Diagnosis: Normal labor [O80, Z37.9] at [redacted]w[redacted]d Secondary diagnosis:  Active Problems:   Pregnancy   B12 deficiency anemia   Normal labor   COVID-19   Vaginal delivery   Mode of Delivery: normal spontaneous vaginal delivery              Discharge diagnosis: Term Pregnancy Delivered and COVID positive      Intrapartum Procedures: none   Post partum procedures: postpartum tubal ligation  Complications: none and 1st , no repair degree perineal laceration                     Discharge Day SOAP Note:  Progress Note - Vaginal Delivery  Teresa Ortiz is a 30 y.o. [id number] now PP day 2 s/p Vaginal, Spontaneous . Delivery was uncomplicated  Subjective  The patient has the following complaints: has no unusual complaints  Pain is controlled with current medications.   Patient is urinating without difficulty.  She is ambulating well.     Objective  Vital signs: BP 105/65 (BP Location: Left Arm)   Pulse 81   Temp 97.9 F (36.6 C) (Oral)   Resp 20   Ht 5\' 10"  (1.778 m)   Wt 99.3 kg   LMP 10/19/2019 (Exact Date)   SpO2 100%   Breastfeeding Unknown   BMI 31.42 kg/m   Physical Exam: Gen: NAD Fundus Fundal Tone: Firm  Lochia Amount: Small        Data Review Labs: Lab Results  Component Value Date   WBC 13.2 (H) 07/25/2020   HGB 10.2 (L) 07/25/2020   HCT 32.0 (L) 07/25/2020   MCV 78.0 (L) 07/25/2020   PLT 237 07/25/2020   CBC Latest Ref Rng & Units 07/25/2020 07/24/2020 07/04/2020  WBC 4.0 - 10.5 K/uL 13.2(H) 14.1(H) 9.0  Hemoglobin 12.0 - 15.0 g/dL 10.2(L) 10.5(L) 10.0(L)  Hematocrit 36.0 - 46.0 % 32.0(L) 33.2(L) 31.1(L)  Platelets 150 - 400 K/uL 237 249 235   O POS  Edinburgh Score: Edinburgh Postnatal Depression Scale  Screening Tool 07/25/2020  I have been able to laugh and see the funny side of things. 0  I have looked forward with enjoyment to things. 0  I have blamed myself unnecessarily when things went wrong. 1  I have been anxious or worried for no good reason. 2  I have felt scared or panicky for no good reason. 1  Things have been getting on top of me. 1  I have been so unhappy that I have had difficulty sleeping. 0  I have felt sad or miserable. 0  I have been so unhappy that I have been crying. 0  The thought of harming myself has occurred to me. 0  Edinburgh Postnatal Depression Scale Total 5    Assessment/Plan  Active Problems:   Pregnancy   B12 deficiency anemia   Normal labor   COVID-19   Vaginal delivery    Plan for discharge today.  Discharge Instructions: Per After Visit Summary. Activity: Advance as tolerated. Pelvic rest for 6 weeks.  Also refer to After Visit Summary Diet: Regular Medications: Allergies as of 07/26/2020  Reactions   Sulfa Antibiotics Rash      Medication List    TAKE these medications   Fusion Plus Caps Take 1 tablet by mouth daily.   ibuprofen 600 MG tablet Commonly known as: ADVIL Take 1 tablet (600 mg total) by mouth every 6 (six) hours.   multivitamin-prenatal 27-0.8 MG Tabs tablet Take 1 tablet by mouth daily at 12 noon.   oxyCODONE 5 MG immediate release tablet Commonly known as: Oxy IR/ROXICODONE Take 1 tablet (5 mg total) by mouth every 4 (four) hours as needed for moderate pain.      Outpatient follow up: 2 week tele visit , 6 week ppv with Marcelino Duster  Postpartum contraception: BTL  Discharged Condition: good  Discharged to: home  Newborn Data: Disposition:home with mother  Apgars: APGAR (1 MIN): 8   APGAR (5 MINS): 9   APGAR (10 MINS):    Baby Feeding: Breast    Doreene Burke, CNM  07/26/2020 7:49 AM

## 2020-07-26 NOTE — Progress Notes (Signed)
Pt discharged with infant. Discharge instructions, prescriptions, and follow up appointments given to and reviewed with patient. Pt verbalized understanding. To be escorted out by staff. °

## 2020-07-26 NOTE — Anesthesia Postprocedure Evaluation (Signed)
Anesthesia Post Note  Patient: Teresa Ortiz  Procedure(s) Performed: POST PARTUM TUBAL LIGATION (Bilateral )  Patient location during evaluation: PACU Anesthesia Type: Spinal Level of consciousness: awake and alert and oriented Pain management: pain level controlled Vital Signs Assessment: post-procedure vital signs reviewed and stable Respiratory status: spontaneous breathing Cardiovascular status: blood pressure returned to baseline Anesthetic complications: no   No complications documented.   Last Vitals:  Vitals:   07/25/20 2020 07/25/20 2305  BP: (!) 104/55 105/65  Pulse: 75 81  Resp: 20 20  Temp: 36.8 C 36.6 C  SpO2: 100% 100%    Last Pain:  Vitals:   07/26/20 0854  TempSrc:   PainSc: 5                  Charlotta Lapaglia

## 2020-07-26 NOTE — Discharge Instructions (Signed)
Postpartum Care After Vaginal Delivery The following information offers guidance about how to care for yourself from the time you deliver your baby to 6-12 weeks after delivery (postpartum period). If you have problems or questions, contact your health care provider for more specific instructions. Follow these instructions at home: Vaginal bleeding  It is normal to have vaginal bleeding (lochia) after delivery. Wear a sanitary pad for bleeding and discharge. ? During the first week after delivery, the amount and appearance of lochia is often similar to a menstrual period. ? Over the next few weeks, it will gradually decrease to a dry, yellow-brown discharge. ? For most women, lochia stops completely by 4-6 weeks after delivery, but can vary.  Change your sanitary pads frequently. Watch for any changes in your flow, such as: ? A sudden increase in volume. ? A change in color. ? Large blood clots.  If you pass a blood clot from your vagina, save it and call your health care provider. Do not flush blood clots down the toilet before talking with your health care provider.  Do not use tampons or douches until your health care provider approves.  If you are not breastfeeding, your period should return 6-8 weeks after delivery. If you are feeding your baby breast milk only, your period may not return until you stop breastfeeding. Perineal care  Keep the area between the vagina and the anus (perineum) clean and dry. Use medicated pads and pain-relieving sprays and creams as directed.  If you had a surgical cut in the perineum (episiotomy) or a tear, check the area for signs of infection until you are healed. Check for: ? More redness, swelling, or pain. ? Fluid or blood coming from the cut or tear. ? Warmth. ? Pus or a bad smell.  You may be given a squirt bottle to use instead of wiping to clean the perineum area after you use the bathroom. Pat the area gently to dry it.  To relieve pain  caused by an episiotomy, a tear, or swollen veins in the anus (hemorrhoids), take a warm sitz bath 2-3 times a day. In a sitz bath, the warm water should only come up to your hips and cover your buttocks.   Breast care  In the first few days after delivery, your breasts may feel heavy, full, and uncomfortable (breast engorgement). Milk may also leak from your breasts. Ask your health care provider about ways to help relieve the discomfort.  If you are breastfeeding: ? Wear a bra that supports your breasts and fits well. Use breast pads to absorb milk that leaks. ? Keep your nipples clean and dry. Apply creams and ointments as told. ? You may have uterine contractions every time you breastfeed for up to several weeks after delivery. This helps your uterus return to its normal size. ? If you have any problems with breastfeeding, notify your health care provider or lactation consultant.  If you are not breastfeeding: ? Avoid touching your breasts. Do not squeeze out (express) milk. Doing this can make your breasts produce more milk. ? Wear a good-fitting bra and use cold packs to help with swelling. Intimacy and sexuality  Ask your health care provider when you can engage in sexual activity. This may depend upon: ? Your risk of infection. ? How fast you are healing. ? Your comfort and desire to engage in sexual activity.  You are able to get pregnant after delivery, even if you have not had your period. Talk with   your health care provider about methods of birth control (contraception) or family planning if you desire future pregnancies. Medicines  Take over-the-counter and prescription medicines only as told by your health care provider.  Take an over-the-counter stool softener to help ease bowel movements as told by your health care provider.  If you were prescribed an antibiotic medicine, take it as told by your health care provider. Do not stop taking the antibiotic even if you start to  feel better.  Review all previous and current prescriptions to check for possible transfer into breast milk. Activity  Gradually return to your normal activities as told by your health care provider.  Rest as much as possible. Nap while your baby is sleeping. Eating and drinking  Drink enough fluid to keep your urine pale yellow.  To help prevent or relieve constipation, eat high-fiber foods every day.  Choose healthy eating to support breastfeeding or weight loss goals.  Take your prenatal vitamins until your health care provider tells you to stop.   General tips/recommendations  Do not use any products that contain nicotine or tobacco. These products include cigarettes, chewing tobacco, and vaping devices, such as e-cigarettes. If you need help quitting, ask your health care provider.  Do not drink alcohol, especially if you are breastfeeding.  Do not take medications or drugs that are not prescribed to you, especially if you are breastfeeding.  Visit your health care provider for a postpartum checkup within the first 3-6 weeks after delivery.  Complete a comprehensive postpartum visit no later than 12 weeks after delivery.  Keep all follow-up visits for you and your baby. Contact a health care provider if:  You feel unusually sad or worried.  Your breasts become red, painful, or hard.  You have a fever or other signs of an infection.  You have bleeding that is soaking through one pad an hour or you have blood clots.  You have a severe headache that doesn't go away or you have vision changes.  You have nausea and vomiting and are unable to eat or drink anything for 24 hours. Get help right away if:  You have chest pain or difficulty breathing.  You have sudden, severe leg pain.  You faint or have a seizure.  You have thoughts about hurting yourself or your baby. If you ever feel like you may hurt yourself or others, or have thoughts about taking your own life,  get help right away. Go to your nearest emergency department or:  Call your local emergency services (911 in the [place]  The National Suicide Prevention Lifeline at 1-800-273-8255. This suicide crisis helpline is open 24 hours a day.  Text the Crisis Text Line at 741741 (in the [place] Summary  The period of time after you deliver your newborn up to 6-12 weeks after delivery is called the postpartum period.  Keep all follow-up visits for you and your baby.  Review all previous and current prescriptions to check for possible transfer into breast milk.  Contact a health care provider if you feel unusually sad or worried during the postpartum period. This information is not intended to replace advice given to you by your health care provider. Make sure you discuss any questions you have with your health care provider. Document Revised: 03/02/2020 Document Reviewed: 03/02/2020 Else[employer] Patient Education  2021 [employer] Postpartum Baby Blues The postpartum period begins right after the birth of a baby. During this time, there is often joy and excitement. It is also a   time of many changes in the life of the parents. A mother may feel happy one minute and sad or stressed the next. These feelings of sadness, called the baby blues, usually happen in the period right after the baby is born and go away within a week or two. What are the causes? The exact cause of this condition is not known. Changes in hormone levels after childbirth are believed to trigger some of the symptoms. Other factors that can play a role in these mood changes include:  Lack of sleep.  Stressful life events, such as financial problems, caring for a loved one, or death of a loved one.  Genetics. What are the signs or symptoms? Symptoms of this condition include:  Changes in mood, such as going from extreme happiness to sadness.  A decrease in concentration.  Difficulty sleeping.  Crying spells and  tearfulness.  Loss of appetite.  Irritability.  Anxiety. If these symptoms last for more than 2 weeks or become more severe, you may have postpartum depression. How is this diagnosed? This condition is diagnosed based on an evaluation of your symptoms. Your health care provider may use a screening tool that includes a list of questions to help identify a person with the baby blues or postpartum depression. How is this treated? The baby blues usually go away on their own in 1-2 weeks. Social support is often what is needed. You will be encouraged to get adequate sleep and rest. Follow these instructions at home: Lifestyle  Get as much rest as you can. Take a nap when the baby sleeps.  Exercise regularly as told by your health care provider. Some women find yoga and walking to be helpful.  Eat a balanced and nourishing diet. This includes plenty of fruits and vegetables, whole grains, and lean proteins.  Do little things that you enjoy. Take a bubble bath, read your favorite magazine, or listen to your favorite music.  Avoid alcohol.  Ask for help with household chores, cooking, grocery shopping, or running errands. Do not try to do everything yourself. Consider hiring a postpartum doula to help. This is a professional who specializes in providing support to new mothers.  Try not to make any major life changes during pregnancy or right after giving birth. This can add stress.      General instructions  Talk to people close to you about how you are feeling. Get support from your partner, family members, friends, or other new moms. You may want to join a support group.  Find ways to manage stress. This may include: ? Writing your thoughts and feelings in a journal. ? Spending time outside. ? Spending time with people who make you laugh.  Try to stay positive in how you think. Think about the things you are grateful for.  Take over-the-counter and prescription medicines only as  told by your health care provider.  Let your health care provider know if you have any concerns.  Keep all postpartum visits. This is important. Contact a health care provider if:  Your baby blues do not go away after 2 weeks. Get help right away if:  You have thoughts of taking your own life (suicidal thoughts), or of harming your baby or someone else.  You see or hear things that are not there (hallucinations). If you ever feel like you may hurt yourself or others, or have thoughts about taking your own life, get help right away. Go to your nearest emergency department or:  Call   your local emergency services (911 in the [place]  Call a suicide crisis helpline, such as the National Suicide Prevention Lifeline, at 1-800-273-8255. This is open 24 hours a day in the [place]  Text the Crisis Text Line at 741741 (in the [place] Summary  After giving birth, you may feel happy one minute and sad or stressed the next. Feelings of sadness that happen right after the baby is born and go away after a week or two are called the baby blues.  You can manage the baby blues by getting enough rest, eating a healthy diet, exercising, spending time with supportive people, and finding ways to manage stress.  If feelings of sadness and stress last longer than 2 weeks or get in the way of caring for your baby, talk with your health care provider. This may mean you have postpartum depression. This information is not intended to replace advice given to you by your health care provider. Make sure you discuss any questions you have with your health care provider. Document Revised: 12/10/2019 Document Reviewed: 12/10/2019 Elsevier Patient Education  2021 [employer] Breastfeeding Tips for a Good Latch Latching is how your baby's mouth attaches to your nipple to breastfeed. It is an important part of breastfeeding. Your baby may have trouble latching for a number of reasons, such as:  Not being in the right  position.  Using a bottle or pacifier too early.  Problems within your baby's mouth, tongue, or lips.  The shape of your nipples.  Your baby being born early (prematurely). Small babies often have a weak suck.  Breasts becoming overfilled with milk (engorged breasts).  Express a little milk to help soften the breast. Work with a breastfeeding specialist (lactation consultant) to help your baby have a good latch. How does this affect me? A poor latch may cause you to have problems such as:  Cracked nipples.  Sore nipples.  Breasts becoming overfilled with milk  Plugged milk ducts.  Low milk supply.  Breast inflammation.  Breast infection. How does this affect my baby? A poor latch may cause your baby to not be able to feed well. As a result, he or she may have trouble gaining weight. Follow these instructions at home: How to position your baby  Find a comfortable place to sit or lie down. Your neck and back should be well supported.  If you are seated, place a pillow or rolled-up blanket under your baby. This will bring him or her to the level of your breast.  Make sure that your baby's belly is facing your belly.  Try different positions to find one that works best for you and your baby. How to help your baby latch  To start, you might find it helpful to gently rub your breast. Move your fingertips in a circle as you massage from your chest wall toward your nipple. This helps milk flow. Keep doing this during feeding if needed.  Position your breast. Hold your breast with four fingers underneath and your thumb above your nipple. Keep your fingers away from your nipple and your baby's mouth. Follow these steps to help your baby latch: 1. Rub your baby's lips gently with your finger or nipple. 2. When your baby's mouth is open wide enough, quickly bring your baby to your breast and place your whole nipple into your baby's mouth. Place as much of the colored area around  your nipple (areola)as possible into your baby's mouth. 3. Your baby's tongue should be between   his or her lower gum and your breast. 4. You should be able to see more areola above your baby's upper lip than below the lower lip. 5. When your baby starts sucking, you will feel a gentle pull on your nipple. You should not feel any pain. Be patient. It is common for a baby to suck for about 2-3 minutes to start the flow of breast milk. 6. Make sure that your baby's mouth is in the right position around your nipple. Your baby's lips should make a seal on your breast and be turned outward.   General instructions  Look for these signs that your baby has latched on to your nipple: ? The baby is quietly tugging or sucking without causing you pain. ? You hear the baby swallow after every 3 or 4 sucks. ? You see movement above and in front of the baby's ears while he or she is sucking.  Be aware of these signs that your baby has not latched on to your nipple: ? The baby makes sucking sounds or smacking sounds while feeding. ? You have nipple pain.  If your baby is not latched well, put your little finger between your baby's gums and your nipple. This will break the seal. Then try to help your baby latch again.  If you need help, get help from a breastfeeding specialist. Contact a doctor if:  You have cracking or soreness in your nipples that lasts longer than 1 week.  You have nipple pain.  Your breasts are filled with too much milk (engorgement), and this does not improve after 48-72 hours.  You have a plugged milk duct and a fever.  You follow the tips for a good latch but need more help.  You have a pus-like fluid coming from your breast.  Your baby is not gaining weight.  Your baby loses weight. Summary  Latching is how your baby's mouth attaches to your nipple to breastfeed.  Try different positions for breastfeeding to find one that works best for you and your baby.  A poor  latch may cause you to have cracked or sore nipples or other problems.  Work with a breastfeeding specialist (lactation consultant) to help your baby have a good latch. This information is not intended to replace advice given to you by your health care provider. Make sure you discuss any questions you have with your health care provider. Document Revised: 12/15/2019 Document Reviewed: 12/15/2019 Elsevier Patient Education  2021 [employer] Breastfeeding  Choosing to breastfeed is one of the best decisions you can make for yourself and your baby. A change in hormones during pregnancy causes your breasts to make breast milk in your milk-producing glands. Hormones prevent breast milk from being released before your baby is born. They also prompt milk flow after birth. Once breastfeeding has begun, thoughts of your baby, as well as his or her sucking or crying, can stimulate the release of milk from your milk-producing glands. Benefits of breastfeeding Research shows that breastfeeding offers many health benefits for infants and mothers. It also offers a cost-free and convenient way to feed your baby. For your baby  Your first milk (colostrum) helps your baby's digestive system to function better.  Special cells in your milk (antibodies) help your baby to fight off infections.  Breastfed babies are less likely to develop asthma, allergies, obesity, or type 2 diabetes. They are also at lower risk for sudden infant death syndrome (SIDS).  Nutrients in breast milk are better able   to meet your baby's needs compared to infant formula.  Breast milk improves your baby's brain development. For you  Breastfeeding helps to create a very special bond between you and your baby.  Breastfeeding is convenient. Breast milk costs nothing and is always available at the correct temperature.  Breastfeeding helps to burn calories. It helps you to lose the weight that you gained during  pregnancy.  Breastfeeding makes your uterus return faster to its size before pregnancy. It also slows bleeding (lochia) after you give birth.  Breastfeeding helps to lower your risk of developing type 2 diabetes, osteoporosis, rheumatoid arthritis, cardiovascular disease, and breast, ovarian, uterine, and endometrial cancer later in life. Breastfeeding basics Starting breastfeeding  Find a comfortable place to sit or lie down, with your neck and back well-supported.  Place a pillow or a rolled-up blanket under your baby to bring him or her to the level of your breast (if you are seated). Nursing pillows are specially designed to help support your arms and your baby while you breastfeed.  Make sure that your baby's tummy (abdomen) is facing your abdomen.  Gently massage your breast. With your fingertips, massage from the outer edges of your breast inward toward the nipple. This encourages milk flow. If your milk flows slowly, you may need to continue this action during the feeding.  Support your breast with 4 fingers underneath and your thumb above your nipple (make the letter "C" with your hand). Make sure your fingers are well away from your nipple and your baby's mouth.  Stroke your baby's lips gently with your finger or nipple.  When your baby's mouth is open wide enough, quickly bring your baby to your breast, placing your entire nipple and as much of the areola as possible into your baby's mouth. The areola is the colored area around your nipple. ? More areola should be visible above your baby's upper lip than below the lower lip. ? Your baby's lips should be opened and extended outward (flanged) to ensure an adequate, comfortable latch. ? Your baby's tongue should be between his or her lower gum and your breast.  Make sure that your baby's mouth is correctly positioned around your nipple (latched). Your baby's lips should create a seal on your breast and be turned out (everted).  It  is common for your baby to suck about 2-3 minutes in order to start the flow of breast milk. Latching Teaching your baby how to latch onto your breast properly is very important. An improper latch can cause nipple pain, decreased milk supply, and poor weight gain in your baby. Also, if your baby is not latched onto your nipple properly, he or she may swallow some air during feeding. This can make your baby fussy. Burping your baby when you switch breasts during the feeding can help to get rid of the air. However, teaching your baby to latch on properly is still the best way to prevent fussiness from swallowing air while breastfeeding. Signs that your baby has successfully latched onto your nipple  Silent tugging or silent sucking, without causing you pain. Infant's lips should be extended outward (flanged).  Swallowing heard between every 3-4 sucks once your milk has started to flow (after your let-down milk reflex occurs).  Muscle movement above and in front of his or her ears while sucking. Signs that your baby has not successfully latched onto your nipple  Sucking sounds or smacking sounds from your baby while breastfeeding.  Nipple pain. If you think   your baby has not latched on correctly, slip your finger into the corner of your baby's mouth to break the suction and place it between your baby's gums. Attempt to start breastfeeding again. Signs of successful breastfeeding Signs from your baby  Your baby will gradually decrease the number of sucks or will completely stop sucking.  Your baby will fall asleep.  Your baby's body will relax.  Your baby will retain a small amount of milk in his or her mouth.  Your baby will let go of your breast by himself or herself. Signs from you  Breasts that have increased in firmness, weight, and size 1-3 hours after feeding.  Breasts that are softer immediately after breastfeeding.  Increased milk volume, as well as a change in milk consistency  and color by the fifth day of breastfeeding.  Nipples that are not sore, cracked, or bleeding. Signs that your baby is getting enough milk  Wetting at least 1-2 diapers during the first 24 hours after birth.  Wetting at least 5-6 diapers every 24 hours for the first week after birth. The urine should be clear or pale yellow by the age of 5 days.  Wetting 6-8 diapers every 24 hours as your baby continues to grow and develop.  At least 3 stools in a 24-hour period by the age of 5 days. The stool should be soft and yellow.  At least 3 stools in a 24-hour period by the age of 7 days. The stool should be seedy and yellow.  No loss of weight greater than 10% of birth weight during the first 3 days of life.  Average weight gain of 4-7 oz (113-198 g) per week after the age of 4 days.  Consistent daily weight gain by the age of 5 days, without weight loss after the age of 2 weeks. After a feeding, your baby may spit up a small amount of milk. This is normal. Breastfeeding frequency and duration Frequent feeding will help you make more milk and can prevent sore nipples and extremely full breasts (breast engorgement). Breastfeed when you feel the need to reduce the fullness of your breasts or when your baby shows signs of hunger. This is called "breastfeeding on demand." Signs that your baby is hungry include:  Increased alertness, activity, or restlessness.  Movement of the head from side to side.  Opening of the mouth when the corner of the mouth or cheek is stroked (rooting).  Increased sucking sounds, smacking lips, cooing, sighing, or squeaking.  Hand-to-mouth movements and sucking on fingers or hands.  Fussing or crying. Avoid introducing a pacifier to your baby in the first 4-6 weeks after your baby is born. After this time, you may choose to use a pacifier. Research has shown that pacifier use during the first year of a baby's life decreases the risk of sudden infant death syndrome  (SIDS). Allow your baby to feed on each breast as long as he or she wants. When your baby unlatches or falls asleep while feeding from the first breast, offer the second breast. Because newborns are often sleepy in the first few weeks of life, you may need to awaken your baby to get him or her to feed. Breastfeeding times will vary from baby to baby. However, the following rules can serve as a guide to help you make sure that your baby is properly fed:  Newborns (babies 4 weeks of age or younger) may breastfeed every 1-3 hours.  Newborns should not go without breastfeeding   for longer than 3 hours during the day or 5 hours during the night.  You should breastfeed your baby a minimum of 8 times in a 24-hour period. Breast milk pumping Pumping and storing breast milk allows you to make sure that your baby is exclusively fed your breast milk, even at times when you are unable to breastfeed. This is especially important if you go back to work while you are still breastfeeding, or if you are not able to be present during feedings. Your lactation consultant can help you find a method of pumping that works best for you and give you guidelines about how long it is safe to store breast milk.      Caring for your breasts while you breastfeed Nipples can become dry, cracked, and sore while breastfeeding. The following recommendations can help keep your breasts moisturized and healthy:  Avoid using soap on your nipples.  Wear a supportive bra designed especially for nursing. Avoid wearing underwire-style bras or extremely tight bras (sports bras).  Air-dry your nipples for 3-4 minutes after each feeding.  Use only cotton bra pads to absorb leaked breast milk. Leaking of breast milk between feedings is normal.  Use lanolin on your nipples after breastfeeding. Lanolin helps to maintain your skin's normal moisture barrier. Pure lanolin is not harmful (not toxic) to your baby. You may also hand express a few  drops of breast milk and gently massage that milk into your nipples and allow the milk to air-dry. In the first few weeks after giving birth, some women experience breast engorgement. Engorgement can make your breasts feel heavy, warm, and tender to the touch. Engorgement peaks within 3-5 days after you give birth. The following recommendations can help to ease engorgement:  Completely empty your breasts while breastfeeding or pumping. You may want to start by applying warm, moist heat (in the shower or with warm, water-soaked hand towels) just before feeding or pumping. This increases circulation and helps the milk flow. If your baby does not completely empty your breasts while breastfeeding, pump any extra milk after he or she is finished.  Apply ice packs to your breasts immediately after breastfeeding or pumping, unless this is too uncomfortable for you. To do this: ? Put ice in a plastic bag. ? Place a towel between your skin and the bag. ? Leave the ice on for 20 minutes, 2-3 times a day.  Make sure that your baby is latched on and positioned properly while breastfeeding. If engorgement persists after 48 hours of following these recommendations, contact your health care provider or a lactation consultant. Overall health care recommendations while breastfeeding  Eat 3 healthy meals and 3 snacks every day. Well-nourished mothers who are breastfeeding need an additional 450-500 calories a day. You can meet this requirement by increasing the amount of a balanced diet that you eat.  Drink enough water to keep your urine pale yellow or clear.  Rest often, relax, and continue to take your prenatal vitamins to prevent fatigue, stress, and low vitamin and mineral levels in your body (nutrient deficiencies).  Do not use any products that contain nicotine or tobacco, such as cigarettes and e-cigarettes. Your baby may be harmed by chemicals from cigarettes that pass into breast milk and exposure to  secondhand smoke. If you need help quitting, ask your health care provider.  Avoid alcohol.  Do not use illegal drugs or marijuana.  Talk with your health care provider before taking any medicines. These include over-the-counter and prescription   medicines as well as vitamins and herbal supplements. Some medicines that may be harmful to your baby can pass through breast milk.  It is possible to become pregnant while breastfeeding. If birth control is desired, ask your health care provider about options that will be safe while breastfeeding your baby. Where to find more information: [employer]: www.llli.org Contact a health care provider if:  You feel like you want to stop breastfeeding or have become frustrated with breastfeeding.  Your nipples are cracked or bleeding.  Your breasts are red, tender, or warm.  You have: ? Painful breasts or nipples. ? A swollen area on either breast. ? A fever or chills. ? Nausea or vomiting. ? Drainage other than breast milk from your nipples.  Your breasts do not become full before feedings by the fifth day after you give birth.  You feel sad and depressed.  Your baby is: ? Too sleepy to eat well. ? Having trouble sleeping. ? More than 1 week old and wetting fewer than 6 diapers in a 24-hour period. ? Not gaining weight by 5 days of age.  Your baby has fewer than 3 stools in a 24-hour period.  Your baby's skin or the white parts of his or her eyes become yellow. Get help right away if:  Your baby is overly tired (lethargic) and does not want to wake up and feed.  Your baby develops an unexplained fever. Summary  Breastfeeding offers many health benefits for infant and mothers.  Try to breastfeed your infant when he or she shows early signs of hunger.  Gently tickle or stroke your baby's lips with your finger or nipple to allow the baby to open his or her mouth. Bring the baby to your breast. Make sure that much of  the areola is in your baby's mouth. Offer one side and burp the baby before you offer the other side.  Talk with your health care provider or lactation consultant if you have questions or you face problems as you breastfeed. This information is not intended to replace advice given to you by your health care provider. Make sure you discuss any questions you have with your health care provider. Document Revised: 09/11/2017 Document Reviewed: 07/19/2016 Elsevier Patient Education  2021 Elsevier Inc. Breast Pumping Tips Breast pumping is a way to get milk out of your breasts. You will then store the milk for your baby to use when you are away from home. There are three ways to pump.  You can use your hand to massage and squeeze your breast (hand expression).  You can use a hand-held machine to manually pump your milk.  You can use an electric machine to pump your milk. In the beginning you may not get much milk. After a few days, your breasts should make more. Pumping can help you start making milk after your baby is born. Pumping helps you to keep making milk when you are away from your baby. When should I pump? You can start pumping soon after your baby is born. Follow these tips:  When you are with your baby: ? Pump after you breastfeed. ? Pump from the free breast while you breastfeed.  When you are away from your baby: ? Pump every 2-3 hours for 15 minutes. ? Pump both breasts at the same time if you can.  If your baby drinks formula, pump around the time your baby gets the formula.  If you drank alcohol, wait 2 hours before you   pump.  If you are going to have surgery, ask your doctor when you should pump again. How do I get ready to pump? Try to relax. Try these things to help your milk come in:  Smell your baby's blanket or clothes.  Look at a picture or video of your baby.  Sit in a quiet, private space.  Place a cloth on your breast. The cloth should be warm and a little  wet.  Massage your breast and nipple.  Play relaxing music.  Picture your milk flowing.  Drink water and eat a snack. What are some tips? General tips for pumping breast milk  Always wash your hands with soap and water for at least 20 seconds before pumping.  If you do not get much milk or if pumping hurts, try different pump settings or a different kind of pump.  Drink enough fluid so your pee (urine) is clear or pale yellow.  Wear clothing that opens in the front or is easy to take off.  Pump milk into a clean bottle or container.  Do not smoke or use any products that contain nicotine or tobacco. If you need help quitting, ask your doctor.  Try to get a hands-free pumping bra, if possible. This makes it easy to pump breast milk. You can buy one or make your own.   Tips for storing breast milk  Store breast milk in a clean, BPA-free container. These include: ? A glass or plastic bottle. ? A milk storage bag.  Store only 2-4 ounces of breast milk in each container.  Swirl the breast milk in the container. Do not shake it.  Write down the date you pumped the milk on the container.  This is how long you can store breast milk: ? Room temperature: 6-8 hours. It is best to use the milk within 4 hours. ? Cooler with ice packs: 24 hours. ? Refrigerator: 5-8 days, if the milk is clean. It is best to use the milk within 3 days. ? Freezer: 9-12 months, if the milk is clean and stored away from the freezer door. It is best to use the milk within 6 months.  Put milk in the back of the refrigerator or freezer.  Thaw frozen milk using warm water. Do not use the microwave.   Tips for choosing a breast pump When choosing a pump, keep the following things in mind:  Manual breast pumps do not need electricity. They cost less. They can be hard to use.  Electric breast pumps use electricity. They are more expensive. They are easier to use. They collect more milk.  The suction cup  (flange) should be the right size.  Before you buy the pump, check if your insurance will pay for it. Tips for caring for a breast pump  Check the manual that came with your pump for cleaning tips.  Try not to touch the inside of pump parts.  Clean the pump after you use it. To do this: ? Wipe down the electrical part. Use a dry cloth or paper towel. Do not put this part in water or in cleaning products. ? Wash the plastic parts with soap and warm water. Or use the dishwasher if the manual says it is safe. You do not need to clean the tubing unless it touched breast milk. ? Let all the parts air dry. Avoid drying them with a cloth or towel. ? When the parts are clean and dry, put the pump back together. Then   store the pump.  If there is water in the tubing when you want to pump: 1. Attach the tubing to the pump. 2. Turn on the pump to dry the tubing. 3. Turn off the pump when the tube is dry. Summary  Pumping can help you start making milk after your baby is born. It lets you keep making milk when you are away from your baby.  When you are away from your baby, pump for about 15 minutes every 2-3 hours. Pump both breasts at the same time, if you can. This information is not intended to replace advice given to you by your health care provider. Make sure you discuss any questions you have with your health care provider. Document Revised: 03/28/2020 Document Reviewed: 03/28/2020 Elsevier Patient Education  2021 Elsevier Inc.  

## 2020-07-27 LAB — SURGICAL PATHOLOGY

## 2020-08-04 ENCOUNTER — Encounter: Payer: Self-pay | Admitting: Certified Nurse Midwife

## 2020-08-04 ENCOUNTER — Ambulatory Visit (INDEPENDENT_AMBULATORY_CARE_PROVIDER_SITE_OTHER): Payer: BC Managed Care – PPO | Admitting: Certified Nurse Midwife

## 2020-08-04 ENCOUNTER — Other Ambulatory Visit: Payer: Self-pay

## 2020-08-04 VITALS — BP 100/69 | HR 68 | Ht 70.0 in | Wt 200.5 lb

## 2020-08-04 DIAGNOSIS — Z9851 Tubal ligation status: Secondary | ICD-10-CM

## 2020-08-04 DIAGNOSIS — Z5189 Encounter for other specified aftercare: Secondary | ICD-10-CM

## 2020-08-04 DIAGNOSIS — O9229 Other disorders of breast associated with pregnancy and the puerperium: Secondary | ICD-10-CM

## 2020-08-04 DIAGNOSIS — Z1331 Encounter for screening for depression: Secondary | ICD-10-CM

## 2020-08-04 NOTE — Progress Notes (Signed)
  OBSTETRICS/GYNECOLOGY POST-OPERATIVE CLINIC VISIT  Subjective:     Teresa Ortiz is a 30 y.o. female who presents to the clinic 2 weeks status post bilateral tubal ligation for permanent sterilization after spontaneous vaginal birth.   Eating a regular diet with difficulty. Bowel movements are normal. The patient is not having any abdominal pain, but reports left nipple pain unrelieved by home treatment measures.   Denies difficulty breathing or respiratory distress, chest pain, abdominal pain, excessive vaginal bleeding, dysuria, and leg pain or swelling.   The following portions of the patient's history were reviewed and updated as appropriate: allergies, current medications, past family history, past medical history, past social history, past surgical history and problem list.  Review of Systems  Pertinent items are noted in HPI.   Objective:    BP 100/69   Pulse 68   Ht 5\' 10"  (1.778 m)   Wt 200 lb 8 oz (90.9 kg)   Breastfeeding Yes   BMI 28.77 kg/m    General:  alert and no distress  Breast:   left nipple cracked and bleeding  Abdomen: soft, bowel sounds active, non-tender  Incision:   healing well, no drainage, no erythema, no hernia, no seroma, no swelling, no dehiscence, incision well approximated   Depression screen [facility] 2/9 08/04/2020 06/26/2018 07/10/2016  Decreased Interest 0 0 0  Down, Depressed, Hopeless 0 0 0  PHQ - 2 Score 0 0 0  Altered sleeping 0 0 1  Tired, decreased energy 0 0 1  Change in appetite 0 0 0  Feeling bad or failure about yourself  0 0 0  Trouble concentrating 0 0 0  Moving slowly or fidgety/restless 0 0 0  Suicidal thoughts 0 0 0  PHQ-9 Score 0 0 2  Difficult doing work/chores Not difficult at all Not difficult at all -    Assessment:   Visit for wound check  S/P tubal ligation  Postpartum care and examination  Lactating mother   Postpartum nipple pain  Depression screening negative   Plan:   Rx All Purpose Nipple Cream,  see orders   Wound care discussed.  Reviewed red flag symptoms and when to call.   RTC as previously scheduled for postpartum visit or sooner if needed.    Roney Youtz, 09/07/2016, CNM [facility]

## 2020-08-04 NOTE — Patient Instructions (Signed)
Postpartum Baby Blues The postpartum period begins right after the birth of a baby. During this time, there is often joy and excitement. It is also a time of many changes in the life of the parents. A mother may feel happy one minute and sad or stressed the next. These feelings of sadness, called the baby blues, usually happen in the period right after the baby is born and go away within a week or two. What are the causes? The exact cause of this condition is not known. Changes in hormone levels after childbirth are believed to trigger some of the symptoms. Other factors that can play a role in these mood changes include:  Lack of sleep.  Stressful life events, such as financial problems, caring for a loved one, or death of a loved one.  Genetics. What are the signs or symptoms? Symptoms of this condition include:  Changes in mood, such as going from extreme happiness to sadness.  A decrease in concentration.  Difficulty sleeping.  Crying spells and tearfulness.  Loss of appetite.  Irritability.  Anxiety. If these symptoms last for more than 2 weeks or become more severe, you may have postpartum depression. How is this diagnosed? This condition is diagnosed based on an evaluation of your symptoms. Your health care provider may use a screening tool that includes a list of questions to help identify a person with the baby blues or postpartum depression. How is this treated? The baby blues usually go away on their own in 1-2 weeks. Social support is often what is needed. You will be encouraged to get adequate sleep and rest. Follow these instructions at home: Lifestyle  Get as much rest as you can. Take a nap when the baby sleeps.  Exercise regularly as told by your health care provider. Some women find yoga and walking to be helpful.  Eat a balanced and nourishing diet. This includes plenty of fruits and vegetables, whole grains, and lean proteins.  Do little things that you  enjoy. Take a bubble bath, read your favorite magazine, or listen to your favorite music.  Avoid alcohol.  Ask for help with household chores, cooking, grocery shopping, or running errands. Do not try to do everything yourself. Consider hiring a postpartum doula to help. This is a professional who specializes in providing support to new mothers.  Try not to make any major life changes during pregnancy or right after giving birth. This can add stress.      General instructions  Talk to people close to you about how you are feeling. Get support from your partner, family members, friends, or other new moms. You may want to join a support group.  Find ways to manage stress. This may include: ? Writing your thoughts and feelings in a journal. ? Spending time outside. ? Spending time with people who make you laugh.  Try to stay positive in how you think. Think about the things you are grateful for.  Take over-the-counter and prescription medicines only as told by your health care provider.  Let your health care provider know if you have any concerns.  Keep all postpartum visits. This is important. Contact a health care provider if:  Your baby blues do not go away after 2 weeks. Get help right away if:  You have thoughts of taking your own life (suicidal thoughts), or of harming your baby or someone else.  You see or hear things that are not there (hallucinations). If you ever feel like you   may hurt yourself or others, or have thoughts about taking your own life, get help right away. Go to your nearest emergency department or:  Call your local emergency services (911 in the [place]  Call a suicide crisis helpline, such as the National Suicide Prevention Lifeline, at 512-596-5881. This is open 24 hours a day in the [place]  Text the Crisis Text Line at 205-176-3189 (in the [place] Summary  After giving birth, you may feel happy one minute and sad or stressed the next. Feelings of sadness  that happen right after the baby is born and go away after a week or two are called the baby blues.  You can manage the baby blues by getting enough rest, eating a healthy diet, exercising, spending time with supportive people, and finding ways to manage stress.  If feelings of sadness and stress last longer than 2 weeks or get in the way of caring for your baby, talk with your health care provider. This may mean you have postpartum depression. This information is not intended to replace advice given to you by your health care provider. Make sure you discuss any questions you have with your health care provider. Document Revised: 12/10/2019 Document Reviewed: 12/10/2019 Elsevier Patient Education  2021 Elsevier Inc.   Breastfeeding and Cracked or Sore Nipples Breastfeeding can be challenging, especially during the first few weeks after childbirth. It is normal to have some tenderness in your nipples when you start to breastfeed your new baby, even if you have breastfed before. Cracked or sore nipples are often caused by a poor latch, which refers to the way your baby's mouth attaches to your nipple to breastfeed. Soreness can also happen if your baby is not positioned properly at your breast. How does this affect me? Cracked or sore nipples can be painful. If breastfeeding is too painful or your baby has a poor latch, you may have problems completely emptying your breasts during a feeding. This may lead to problems such as overfilling of your breasts with milk (engorgement), infection, or a decrease in milk supply. How does this affect my baby? If a poor latch is the cause of cracked or sore nipples, your baby may also have problems getting enough breast milk during a feeding. Follow these instructions at home: Breastfeeding strategy  Make sure your baby is latched on to your breast and positioned correctly.  Try different breastfeeding positions to find one that works the best.  Start  breastfeeding by having your baby feed from the less sore breast first.  Make sure you break your baby's latch before removing him or her from the breast. To do this: ? Insert your little finger between your nipple and your baby's gums.   If you use a breast pump:  Make sure the flange fits properly over your nipple. A poor-fitting flange can cause nipple damage.  Start by setting the pump to a low setting, and gradually increase the pump strength as needed. Too high of a setting may cause damage to your nipple. Breast care Ensure that your breasts stay moisturized and healthy by:  Avoiding the use of soap on your nipples.  Wearing a supportive bra. Avoid wearing underwire-style bras or tight bras.  Air drying your nipples for 3-4 minutes after each feeding.  Using only cotton bra pads to absorb any breast milk that leaks. Be sure to change the pads if they become soaked with milk.  Using lanolin on your nipples after nursing. If you use pure lanolin,  you do not need to wash it off before feeding your baby again. Pure lanolin is not harmful to your baby.  Massaging some breast milk into your nipples: ? Use your hand to squeeze out a few drops of breast milk (hand express). ? Gently massage the milk into your nipples. ? Let your nipples air-dry. Contact a health care provider if:  You have nipple pain.  You have soreness or cracking that lasts more than one week. Summary  It is normal to have some tenderness in your nipples when you start to breastfeed your new baby. However, you should contact your health care provider if you have nipple pain.  Cracked or sore nipples are most often caused by a poor latch or improper positioning.  To help with cracked or sore nipples, make sure your baby is latched on to your breast and positioned correctly, and keep your nipples healthy and moisturized. Use lanolin or expressed milk on your nipples. Avoid washing your nipples with soap. This  information is not intended to replace advice given to you by your health care provider. Make sure you discuss any questions you have with your health care provider. Document Revised: 12/15/2019 Document Reviewed: 12/15/2019 Elsevier Patient Education  2021 [employer].

## 2020-08-07 ENCOUNTER — Other Ambulatory Visit: Payer: Self-pay | Admitting: Certified Nurse Midwife

## 2020-08-07 ENCOUNTER — Encounter: Payer: Self-pay | Admitting: Certified Nurse Midwife

## 2020-08-07 DIAGNOSIS — Z9189 Other specified personal risk factors, not elsewhere classified: Secondary | ICD-10-CM | POA: Insufficient documentation

## 2020-08-07 DIAGNOSIS — O9229 Other disorders of breast associated with pregnancy and the puerperium: Secondary | ICD-10-CM

## 2020-08-07 MED ORDER — FLUCONAZOLE 200 MG PO TABS: 200.0000 mg | ORAL_TABLET | Freq: Every day | ORAL | 0 refills | Status: DC

## 2020-08-07 NOTE — Progress Notes (Signed)
Infant diagnosed with thrush; Rx Diflucan, see orders.    Serafina Royals, CNM [facility], [facility] 08/07/20 5:01 PM

## 2020-08-09 ENCOUNTER — Telehealth: Payer: Self-pay

## 2020-08-09 NOTE — Telephone Encounter (Signed)
Claim number- [id number]- pls refer to this number when you call.   Need to know the date she was placed out of work and the complication pt was having .   Need delivery date and type of delivery.   Thanks.

## 2020-08-09 NOTE — Telephone Encounter (Signed)
Spoke with [employer]- all requested information given and all questions answered.

## 2020-08-09 NOTE — Telephone Encounter (Signed)
mychart message sent to patient

## 2020-09-01 ENCOUNTER — Encounter: Payer: Self-pay | Admitting: Certified Nurse Midwife

## 2020-09-01 ENCOUNTER — Other Ambulatory Visit: Payer: Self-pay

## 2020-09-01 ENCOUNTER — Ambulatory Visit (INDEPENDENT_AMBULATORY_CARE_PROVIDER_SITE_OTHER): Payer: BC Managed Care – PPO | Admitting: Certified Nurse Midwife

## 2020-09-01 DIAGNOSIS — Z1331 Encounter for screening for depression: Secondary | ICD-10-CM

## 2020-09-01 NOTE — Patient Instructions (Addendum)
Preventive Care 11-31 Years Old, Female Preventive care refers to lifestyle choices and visits with your health care provider that can promote health and wellness. This includes:  A yearly physical exam. This is also called an annual wellness visit.  Regular dental and eye exams.  Immunizations.  Screening for certain conditions.  Healthy lifestyle choices, such as: ? Eating a healthy diet. ? Getting regular exercise. ? Not using drugs or products that contain nicotine and tobacco. ? Limiting alcohol use. What can I expect for my preventive care visit? Physical exam Your health care provider may check your:  Height and weight. These may be used to calculate your BMI (body mass index). BMI is a measurement that tells if you are at a healthy weight.  Heart rate and blood pressure.  Body temperature.  Skin for abnormal spots. Counseling Your health care provider may ask you questions about your:  Past medical problems.  Family's medical history.  Alcohol, tobacco, and drug use.  Emotional well-being.  Home life and relationship well-being.  Sexual activity.  Diet, exercise, and sleep habits.  Work and work Statistician.  Access to firearms.  Method of birth control.  Menstrual cycle.  Pregnancy history. What immunizations do I need? Vaccines are usually given at various ages, according to a schedule. Your health care provider will recommend vaccines for you based on your age, medical history, and lifestyle or other factors, such as travel or where you work.   What tests do I need? Blood tests  Lipid and cholesterol levels. These may be checked every 5 years starting at age 64.  Hepatitis C test.  Hepatitis B test. Screening  Diabetes screening. This is done by checking your blood sugar (glucose) after you have not eaten for a while (fasting).  STD (sexually transmitted disease) testing, if you are at risk.  BRCA-related cancer screening. This may  be done if you have a family history of breast, ovarian, tubal, or peritoneal cancers.  Pelvic exam and Pap test. This may be done every 3 years starting at age 61. Starting at age 82, this may be done every 5 years if you have a Pap test in combination with an HPV test. Talk with your health care provider about your test results, treatment options, and if necessary, the need for more tests.   Follow these instructions at home: Eating and drinking  Eat a healthy diet that includes fresh fruits and vegetables, whole grains, lean protein, and low-fat dairy products.  Take vitamin and mineral supplements as recommended by your health care provider.  Do not drink alcohol if: ? Your health care provider tells you not to drink. ? You are pregnant, may be pregnant, or are planning to become pregnant.  If you drink alcohol: ? Limit how much you have to 0-1 drink a day. ? Be aware of how much alcohol is in your drink. In the [place] one drink equals one 12 oz bottle of beer (355 mL), one 5 oz glass of wine (148 mL), or one 1 oz glass of hard liquor (44 mL).   Lifestyle  Take daily care of your teeth and gums. Brush your teeth every morning and night with fluoride toothpaste. Floss one time each day.  Stay active. Exercise for at least 30 minutes 5 or more days each week.  Do not use any products that contain nicotine or tobacco, such as cigarettes, e-cigarettes, and chewing tobacco. If you need help quitting, ask your health care provider.  Do  not use drugs.  If you are sexually active, practice safe sex. Use a condom or other form of protection to prevent STIs (sexually transmitted infections).  If you do not wish to become pregnant, use a form of birth control. If you plan to become pregnant, see your health care provider for a prepregnancy visit.  Find healthy ways to cope with stress, such as: ? Meditation, yoga, or listening to music. ? Journaling. ? Talking to a trusted  person. ? Spending time with friends and family. Safety  Always wear your seat belt while driving or riding in a vehicle.  Do not drive: ? If you have been drinking alcohol. Do not ride with someone who has been drinking. ? When you are tired or distracted. ? While texting.  Wear a helmet and other protective equipment during sports activities.  If you have firearms in your house, make sure you follow all gun safety procedures.  Seek help if you have been physically or sexually abused. What's next?  Go to your health care provider once a year for an annual wellness visit.  Ask your health care provider how often you should have your eyes and teeth checked.  Stay up to date on all vaccines. This information is not intended to replace advice given to you by your health care provider. Make sure you discuss any questions you have with your health care provider. Document Revised: 02/13/2020 Document Reviewed: 02/26/2018 Elsevier Patient Education  2021 [employer].

## 2020-09-01 NOTE — Progress Notes (Signed)
Subjective:    Teresa Ortiz is a 30 y.o. G38P4014 Caucasian female who presents for a postpartum visit. She is 5 weeks postpartum following a spontaneous vaginal delivery at [redacted]w[redacted]d gestational weeks. Anesthesia: none. I have fully reviewed the prenatal and intrapartum course.   Postpartum course has been uncomplicated.   Baby's course has been uncomplicated. Baby is feeding by breast.   Bleeding no bleeding. Bowel function is normal. Bladder function is normal.   Patient is not sexually active.  Contraception method is tubal ligation.   Postpartum depression screening: negative. Score 0.    Last pap 01/12/2020 and was Neg.  Denies difficulty breathing, respiratory distress, chest pain, abdominal pain, vaginal bleeding, nipple pain or soreness, and leg pain or swelling.  The following portions of the patient's history were reviewed and updated as appropriate: allergies, current medications, past medical history, past surgical history and problem list.  Review of Systems  Pertinent items are noted in HPI.   Objective:   BP 107/68   Pulse 77   Ht 5\' 10"  (1.778 m)   Wt 201 lb 4.8 oz (91.3 kg)   Breastfeeding Yes   BMI 28.88 kg/m   General:  alert, cooperative and no distress   Breasts:  deferred, no complaints  Lungs: clear to auscultation bilaterally  Heart:  regular rate and rhythm  Abdomen: soft, nontender   Vulva: normal  Vagina: normal vagina  Cervix:  closed  Corpus: Well-involuted  Adnexa:  Non-palpable           Depression screen [facility] 2/9 09/01/2020 08/04/2020 06/26/2018 07/10/2016  Decreased Interest 0 0 0 0  Down, Depressed, Hopeless 0 0 0 0  PHQ - 2 Score 0 0 0 0  Altered sleeping 0 0 0 1  Tired, decreased energy 0 0 0 1  Change in appetite 0 0 0 0  Feeling bad or failure about yourself  0 0 0 0  Trouble concentrating 0 0 0 0  Moving slowly or fidgety/restless 0 0 0 0  Suicidal thoughts 0 0 0 0  PHQ-9 Score 0 0 0 2  Difficult doing work/chores - Not  difficult at all Not difficult at all -   GAD 7 : Generalized Anxiety Score 09/01/2020  Nervous, Anxious, on Edge 0  Control/stop worrying 0  Worry too much - different things 0  Trouble relaxing 0  Restless 0  Easily annoyed or irritable 0  Afraid - awful might happen 0  Total GAD 7 Score 0     Assessment:   Postpartum exam 5 wks s/p vaginal delivery and BTL Breastfeeding Depression screening    Plan:   Encouraged routine health maintenance techniques, see AVS.   Reviewed red flag symptoms and when to call  RTC x 8 months for ANNUAL exam or sooner if needed  11/01/2020, Juliann Pares 09/01/20 3:31 PM

## 2020-09-01 NOTE — Progress Notes (Signed)
PP visit, Pt is Breastfeeding, no problems.  No vaginal pain  PHQ9-0  GAD7-0

## 2020-09-01 NOTE — Progress Notes (Signed)
I have seen, interviewed, and examined the patient in conjunction with the [facility] and affirm the diagnosis and management plan.   Gunnar Bulla, CNM [facility], [facility] 09/01/20 5:08 PM

## 2020-12-25 ENCOUNTER — Ambulatory Visit
Admission: EM | Admit: 2020-12-25 | Discharge: 2020-12-25 | Disposition: A | Discharge status: Home / Self Care | Payer: BC Managed Care – PPO | Attending: Family Medicine | Admitting: Family Medicine

## 2020-12-25 ENCOUNTER — Other Ambulatory Visit: Payer: Self-pay

## 2020-12-25 DIAGNOSIS — J069 Acute upper respiratory infection, unspecified: Secondary | ICD-10-CM

## 2020-12-25 LAB — GROUP A STREP BY PCR: Group A Strep by PCR: NOT DETECTED

## 2020-12-25 MED ORDER — IPRATROPIUM BROMIDE 0.06 % NA SOLN: 2.0000 | Freq: Four times a day (QID) | NASAL | 12 refills | Status: DC

## 2020-12-25 MED ORDER — BENZONATATE 100 MG PO CAPS: 200.0000 mg | ORAL_CAPSULE | Freq: Three times a day (TID) | ORAL | 0 refills | Status: DC

## 2020-12-25 NOTE — Discharge Instructions (Addendum)
Use the Atrovent nasal spray, 2 squirts in each nostril every 6 hours, as needed for runny nose and postnasal drip.  Use the Tessalon Perles every 8 hours during the day.  Take them with a small sip of water.  They may give you some numbness to the base of your tongue or a metallic taste in your mouth, this is normal.  Use the OTC Robitussin cough syrup at bedtime for cough and congestion.   Gargle with warm salt water to wash away the nasal drainage that is causing your throat to be sore.  Return for reevaluation or see your primary care provider for any new or worsening symptoms.

## 2020-12-25 NOTE — ED Triage Notes (Signed)
Patient states that she has been having a sore throat, clammy and ear pain that started on Saturday.

## 2020-12-25 NOTE — ED Provider Notes (Signed)
[facility]    CSN: [id number] Arrival date & time: 12/25/20  0911      History   Chief Complaint Chief Complaint  Patient presents with   Sore Throat    HPI Teresa Ortiz is a 30 y.o. female.   HPI  30 year old female here for evaluation of sore throat, clammy skin, and bilateral ear pain x2 days.  Patient reports that she has had some upper respiratory symptoms to include nasal congestion and mild runny nose, sore throat, pain along her eustachian tube especially on the right, nonproductive cough, and fatigue.  She denies fever, nasal discharge, or GI complaints.  Past Medical History:  Diagnosis Date   Asthma    childhood   Frequent headaches    Migraine without aura     Patient Active Problem List   Diagnosis Date Noted   Breastfeeding problem 08/07/2020   History of COVID-19 07/24/2020   E. coli UTI 02/14/2020   B12 deficiency anemia 05/01/2018   Migraine without aura 05/11/2014    Past Surgical History:  Procedure Laterality Date   DILATION AND EVACUATION N/A 07/16/2017   Procedure: DILATATION AND EVACUATION;  Surgeon: Linzie Collin, MD;  Location: [facility];  Service: Gynecology;  Laterality: N/A;   EXTRACORPOREAL SHOCK WAVE LITHOTRIPSY Left 07/01/2019   Procedure: EXTRACORPOREAL SHOCK WAVE LITHOTRIPSY (ESWL);  Surgeon: Sondra Come, MD;  Location: [facility];  Service: Urology;  Laterality: Left;   TUBAL LIGATION Bilateral 07/25/2020   Procedure: POST PARTUM TUBAL LIGATION;  Surgeon: [name], MD;  Location: [facility];  Service: Gynecology;  Laterality: Bilateral;    OB History     Gravida  5   Para  4   Term  4   Preterm      AB  1   Living  4      SAB  1   IAB      Ectopic      Multiple  0   Live Births  4            Home Medications    Prior to Admission medications   Medication Sig Start Date End Date Taking? Authorizing Provider  benzonatate (TESSALON) 100 MG capsule Take 2 capsules (200 mg  total) by mouth every 8 (eight) hours. If you take this medication during the day you need to pump and dump your breast milk after taking this medication. 12/25/20  Yes Becky Augusta, NP  ipratropium (ATROVENT) 0.06 % nasal spray Place 2 sprays into both nostrils 4 (four) times daily. 12/25/20  Yes Becky Augusta, NP  Multiple Vitamin (MULTIVITAMIN) tablet Take 1 tablet by mouth daily.   Yes [provider]    Family History Family History  Problem Relation Age of Onset   Mental illness Mother        bipolor/depressed   Cervical cancer Mother    Alcohol abuse Father     Social History Social History   Tobacco Use   Smoking status: Never   Smokeless tobacco: Never  Vaping Use   Vaping Use: Never used  Substance Use Topics   Alcohol use: No    Alcohol/week: 0.0 standard drinks    Comment: rarely   Drug use: No     Allergies   Sulfa antibiotics   Review of Systems Review of Systems  Constitutional:  Positive for fatigue.  HENT:  Positive for congestion, ear pain, rhinorrhea and sore throat. Negative for ear discharge and tinnitus.   Respiratory:  Positive for cough. Negative for shortness of breath and wheezing.   Gastrointestinal:  Negative for diarrhea, nausea and vomiting.  Skin:  Negative for rash.    Physical Exam Triage Vital Signs ED Triage Vitals  Enc Vitals Group     BP 12/25/20 0935 112/80     Pulse Rate 12/25/20 0935 87     Resp 12/25/20 0935 18     Temp 12/25/20 0935 98.1 F (36.7 C)     Temp Source 12/25/20 0935 Oral     SpO2 12/25/20 0935 98 %     Weight 12/25/20 0934 200 lb (90.7 kg)     Height 12/25/20 0934 5\' 10"  (1.778 m)     Head Circumference --      Peak Flow --      Pain Score 12/25/20 0933 8     Pain Loc --      Pain Edu? --      Excl. in GC? --    No data found.  Updated Vital Signs BP 112/80 (BP Location: Right Arm)   Pulse 87   Temp 98.1 F (36.7 C) (Oral)   Resp 18   Ht 5\' 10"  (1.778 m)   Wt 200 lb (90.7 kg)   SpO2  98%   Breastfeeding Yes   BMI 28.70 kg/m   Visual Acuity Right Eye Distance:   Left Eye Distance:   Bilateral Distance:    Right Eye Near:   Left Eye Near:    Bilateral Near:     Physical Exam Vitals and nursing note reviewed.  Constitutional:      General: She is not in acute distress.    Appearance: Normal appearance. She is not ill-appearing.  HENT:     Head: Normocephalic and atraumatic.     Right Ear: Ear canal and external ear normal.     Left Ear: Ear canal and external ear normal.     Nose: Congestion and rhinorrhea present.     Mouth/Throat:     Mouth: Mucous membranes are moist.     Pharynx: Oropharynx is clear. Posterior oropharyngeal erythema present.  Cardiovascular:     Rate and Rhythm: Normal rate and regular rhythm.     Pulses: Normal pulses.     Heart sounds: Normal heart sounds. No murmur heard.   No gallop.  Pulmonary:     Effort: Pulmonary effort is normal.     Breath sounds: Normal breath sounds. No wheezing, rhonchi or rales.  Musculoskeletal:     Cervical back: Normal range of motion and neck supple.  Lymphadenopathy:     Cervical: No cervical adenopathy.  Skin:    General: Skin is warm and dry.     Capillary Refill: Capillary refill takes less than 2 seconds.     Findings: No erythema or rash.  Neurological:     General: No focal deficit present.     Mental Status: She is alert and oriented to person, place, and time.  Psychiatric:        Mood and Affect: Mood normal.        Behavior: Behavior normal.        Thought Content: Thought content normal.        Judgment: Judgment normal.     UC Treatments / Results  Labs (all labs ordered are listed, but only abnormal results are displayed) Labs Reviewed  GROUP A STREP BY PCR    EKG   Radiology No results found.  Procedures Procedures (including critical care  time)  Medications Ordered in UC Medications - No data to display  Initial Impression / Assessment and Plan / UC Course   I have reviewed the triage vital signs and the nursing notes.  Pertinent labs & imaging results that were available during my care of the patient were reviewed by me and considered in my medical decision making (see chart for details).  Patient is a very pleasant 30 year old female who is nontoxic in appearance here for evaluation of URI symptoms x2 days as outlined in HPI above.  Patient's physical exam reveals pearly gray tympanic membranes with mild serous effusion bilaterally.  External auditory canals are clear.  Patient does have some mild tenderness with palpation of the eustachian tube externally.  Nasal mucosa is pale and edematous with scant clear nasal discharge.  Oropharyngeal exam reveals posterior oropharyngeal erythema and mild cobblestoning with clear postnasal drip.  No cervical lymphadenopathy appreciated exam.  Cardiopulmonary exam is benign.  Patient had strep PCR collected at triage which was negative.  Patient's exam is consistent with a viral URI.  Patient is currently breast-feeding her 53-month-old baby so will discharge home on Atrovent nasal spray and Tessalon Perles.  Patient advised that if you use the Tessalon Perles during the day that following taking the medicine she needs to pump her breastmilk and dump it and then she is cleared to breast-feed if she does not take any more Tessalon Perles.  Patient also advised that she can take plain over-the-counter Robitussin as there is no studies on the effects of dextromethorphan so its best to be avoided.  Patient verbalizes understanding of both those instructions.  Work note provided.   Final Clinical Impressions(s) / UC Diagnoses   Final diagnoses:  Viral URI with cough     Discharge Instructions      Use the Atrovent nasal spray, 2 squirts in each nostril every 6 hours, as needed for runny nose and postnasal drip.  Use the Tessalon Perles every 8 hours during the day.  Take them with a small sip of water.  They may  give you some numbness to the base of your tongue or a metallic taste in your mouth, this is normal.  Use the OTC Robitussin cough syrup at bedtime for cough and congestion.   Gargle with warm salt water to wash away the nasal drainage that is causing your throat to be sore.  Return for reevaluation or see your primary care provider for any new or worsening symptoms.      ED Prescriptions     Medication Sig Dispense Auth. Provider   ipratropium (ATROVENT) 0.06 % nasal spray Place 2 sprays into both nostrils 4 (four) times daily. 15 mL Becky Augusta, NP   benzonatate (TESSALON) 100 MG capsule Take 2 capsules (200 mg total) by mouth every 8 (eight) hours. If you take this medication during the day you need to pump and dump your breast milk after taking this medication. 21 capsule Becky Augusta, NP      PDMP not reviewed this encounter.   Becky Augusta, NP 12/25/20 1046

## 2021-05-07 ENCOUNTER — Encounter: Payer: BC Managed Care – PPO | Admitting: Certified Nurse Midwife

## 2021-09-29 DIAGNOSIS — R102 Pelvic and perineal pain: Secondary | ICD-10-CM | POA: Insufficient documentation

## 2021-09-29 DIAGNOSIS — Z7951 Long term (current) use of inhaled steroids: Secondary | ICD-10-CM | POA: Insufficient documentation

## 2021-09-29 DIAGNOSIS — N644 Mastodynia: Secondary | ICD-10-CM | POA: Insufficient documentation

## 2021-09-29 DIAGNOSIS — Z20822 Contact with and (suspected) exposure to covid-19: Secondary | ICD-10-CM | POA: Insufficient documentation

## 2021-09-29 DIAGNOSIS — J45909 Unspecified asthma, uncomplicated: Secondary | ICD-10-CM | POA: Insufficient documentation

## 2021-09-29 DIAGNOSIS — M79605 Pain in left leg: Secondary | ICD-10-CM | POA: Insufficient documentation

## 2021-09-29 DIAGNOSIS — R059 Cough, unspecified: Secondary | ICD-10-CM | POA: Insufficient documentation

## 2021-09-30 ENCOUNTER — Other Ambulatory Visit: Payer: Self-pay

## 2021-09-30 ENCOUNTER — Emergency Department
Admission: EM | Admit: 2021-09-30 | Discharge: 2021-09-30 | Disposition: A | Discharge status: Home / Self Care | Payer: BC Managed Care – PPO | Attending: Emergency Medicine | Admitting: Emergency Medicine

## 2021-09-30 ENCOUNTER — Emergency Department: Payer: BC Managed Care – PPO

## 2021-09-30 DIAGNOSIS — R102 Pelvic and perineal pain: Secondary | ICD-10-CM

## 2021-09-30 DIAGNOSIS — N644 Mastodynia: Secondary | ICD-10-CM

## 2021-09-30 DIAGNOSIS — R059 Cough, unspecified: Secondary | ICD-10-CM

## 2021-09-30 DIAGNOSIS — M79605 Pain in left leg: Secondary | ICD-10-CM

## 2021-09-30 LAB — CBC WITH DIFFERENTIAL/PLATELET
Abs Immature Granulocytes: 0.04 10*3/uL (ref 0.00–0.07)
Basophils Absolute: 0 10*3/uL (ref 0.0–0.1)
Basophils Relative: 0 %
Eosinophils Absolute: 0.2 10*3/uL (ref 0.0–0.5)
Eosinophils Relative: 2 %
HCT: 38.3 % (ref 36.0–46.0)
Hemoglobin: 12.3 g/dL (ref 12.0–15.0)
Immature Granulocytes: 0 %
Lymphocytes Relative: 20 %
Lymphs Abs: 1.9 10*3/uL (ref 0.7–4.0)
MCH: 26.2 pg (ref 26.0–34.0)
MCHC: 32.1 g/dL (ref 30.0–36.0)
MCV: 81.5 fL (ref 80.0–100.0)
Monocytes Absolute: 0.8 10*3/uL (ref 0.1–1.0)
Monocytes Relative: 9 %
Neutro Abs: 6.6 10*3/uL (ref 1.7–7.7)
Neutrophils Relative %: 69 %
Platelets: 330 10*3/uL (ref 150–400)
RBC: 4.7 MIL/uL (ref 3.87–5.11)
RDW: 13.8 % (ref 11.5–15.5)
WBC: 9.6 10*3/uL (ref 4.0–10.5)
nRBC: 0 % (ref 0.0–0.2)

## 2021-09-30 LAB — URINALYSIS, ROUTINE W REFLEX MICROSCOPIC
Bilirubin Urine: NEGATIVE
Glucose, UA: NEGATIVE mg/dL
Ketones, ur: NEGATIVE mg/dL
Leukocytes,Ua: NEGATIVE
Nitrite: NEGATIVE
Protein, ur: NEGATIVE mg/dL
Specific Gravity, Urine: 1.014 (ref 1.005–1.030)
pH: 7 (ref 5.0–8.0)

## 2021-09-30 LAB — RESP PANEL BY RT-PCR (FLU A&B, COVID) ARPGX2
Influenza A by PCR: NEGATIVE
Influenza B by PCR: NEGATIVE
SARS Coronavirus 2 by RT PCR: NEGATIVE

## 2021-09-30 LAB — COMPREHENSIVE METABOLIC PANEL
ALT: 28 U/L (ref 0–44)
AST: 23 U/L (ref 15–41)
Albumin: 3.9 g/dL (ref 3.5–5.0)
Alkaline Phosphatase: 121 U/L (ref 38–126)
Anion gap: 7 (ref 5–15)
BUN: 17 mg/dL (ref 6–20)
CO2: 25 mmol/L (ref 22–32)
Calcium: 9.4 mg/dL (ref 8.9–10.3)
Chloride: 104 mmol/L (ref 98–111)
Creatinine, Ser: 0.78 mg/dL (ref 0.44–1.00)
GFR, Estimated: >60 mL/min (ref >60)
Glucose, Bld: 88 mg/dL (ref 70–99)
Potassium: 3.8 mmol/L (ref 3.5–5.1)
Sodium: 136 mmol/L (ref 135–145)
Total Bilirubin: 0.5 mg/dL (ref 0.3–1.2)
Total Protein: 7.9 g/dL (ref 6.5–8.1)

## 2021-09-30 LAB — POC URINE PREG, ED: Preg Test, Ur: NEGATIVE

## 2021-09-30 MED ORDER — IBUPROFEN 800 MG PO TABS
800.0000 mg | ORAL_TABLET | Freq: Once | ORAL | Status: AC
Start: 2021-09-30 — End: 2021-09-30
  Administered 2021-09-30: 800 mg via ORAL
  Filled 2021-09-30: qty 1

## 2021-09-30 NOTE — ED Notes (Signed)
Patient to radiology via stretcher

## 2021-09-30 NOTE — ED Notes (Signed)
Patient currently has multiple complaints x 1 month. They include: pain to entire left leg, lower back pain, backs of both arm pain, soreness to left lateral breast (while weaning off breast feeding), generalized pain to entire body, feeling run down, sinus congestion, etc. Patient states she hasnt seen a PCP because they are so backed up. She has been taking Tylenol and Motrin with little to no relief. Patient has continued to work. Patient does state she has four children but denies injury that might cause her pain. ?

## 2021-09-30 NOTE — ED Notes (Signed)
Patient resting comfortably on stretcher with her eyes closed. RR even and unlabored. Patient verbalizes no needs or complaints at this time.  ?

## 2021-09-30 NOTE — ED Provider Notes (Signed)
? ?[facility] ?Provider Note ? ? ? Event Date/Time  ? First MD Initiated Contact with Patient 09/30/21 0031   ?  (approximate) ? ? ?History  ? ?left leg pain ? ? ?HPI ? ?Teresa Ortiz is a 31 y.o. female striae of asthma, migraines who presents to the emergency department with multiple complaints. ? ?Patient complains of feeling body aches all over that have been ongoing for over a month.  She denies any fevers but states she has had a cough with green sputum production.  No chest pain or shortness of breath. ? ?She also complains of intermittent pain in the left leg that feels like an ache.  She states at times it is in the posterior left hip and then also in the left shin.  No calf tenderness or calf swelling.  No history of PE or DVT. ? ?Patient also complains over the past 2 days she has had pain in the left lateral breast without any skin changes, redness or warmth.  She is still breast-feeding her 25-month-old child.  States that she direct feeds at night and pumps once a day.  She states she has been doing that for about the past month. ? ?Patient also reports over the past several days she has had lower pelvic pain.  She denies any vaginal bleeding, vaginal discharge, dysuria, hematuria.  She denies any concern for STDs and declines testing today.  Denies any vomiting or diarrhea. ? ?States she is here at night because she has 4 children and this is the only time she can get away.  States that she has been procrastinating and has not made an appointment with her primary care doctor for her symptoms. ? ? ?History provided by patient. ? ? ? ?Past Medical History:  ?Diagnosis Date  ? Asthma   ? childhood  ? Frequent headaches   ? Migraine without aura   ? ? ?Past Surgical History:  ?Procedure Laterality Date  ? DILATION AND EVACUATION N/A 07/16/2017  ? Procedure: DILATATION AND EVACUATION;  Surgeon: Harlin Heys, MD;  Location: [facility];  Service: Gynecology;  Laterality: N/A;  ?  EXTRACORPOREAL SHOCK WAVE LITHOTRIPSY Left 07/01/2019  ? Procedure: EXTRACORPOREAL SHOCK WAVE LITHOTRIPSY (ESWL);  Surgeon: Billey Co, MD;  Location: [facility];  Service: Urology;  Laterality: Left;  ? TUBAL LIGATION Bilateral 07/25/2020  ? Procedure: POST PARTUM TUBAL LIGATION;  Surgeon: Rubie Maid, MD;  Location: [facility];  Service: Gynecology;  Laterality: Bilateral;  ? ? ?MEDICATIONS:  ?Prior to Admission medications   ?Medication Sig Start Date End Date Taking? Authorizing Provider  ?benzonatate (TESSALON) 100 MG capsule Take 2 capsules (200 mg total) by mouth every 8 (eight) hours. If you take this medication during the day you need to pump and dump your breast milk after taking this medication. 12/25/20   Margarette Canada, NP  ?ipratropium (ATROVENT) 0.06 % nasal spray Place 2 sprays into both nostrils 4 (four) times daily. 12/25/20   Margarette Canada, NP  ?Multiple Vitamin (MULTIVITAMIN) tablet Take 1 tablet by mouth daily.    [provider]  ? ? ?Physical Exam  ? ?Triage Vital Signs: ?ED Triage Vitals [09/30/21 0004]  ?Enc Vitals Group  ?   BP (!) 147/93  ?   Pulse Rate 100  ?   Resp 16  ?   Temp 98.2 ?F (36.8 ?C)  ?   Temp Source Oral  ?   SpO2 95 %  ?   Weight 205  lb (93 kg)  ?   Height 5\' 10"  (1.778 m)  ?   Head Circumference   ?   Peak Flow   ?   Pain Score 5  ?   Pain Loc   ?   Pain Edu?   ?   Excl. in [place]?   ? ? ?Most recent vital signs: ?Vitals:  ? 09/30/21 0300 09/30/21 0554  ?BP: 130/80 126/81  ?Pulse: 88 84  ?Resp: 16 16  ?Temp:    ?SpO2: 97% 100%  ? ? ?CONSTITUTIONAL: Alert and oriented and responds appropriately to questions. Well-appearing; well-nourished ?HEAD: Normocephalic, atraumatic ?EYES: Conjunctivae clear, pupils appear equal, sclera nonicteric ?ENT: normal nose; moist mucous membranes ?NECK: Supple, normal ROM ?CARD: RRR; S1 and S2 appreciated; no murmurs, no clicks, no rubs, no gallops ?BREAST:  No breast mass palpated on exam.  LEFT breast is tender to palpation over the  lateral left breast.  No erythema, warmth, induration, fluctuance.  No nipple discharge.  No inverted nipple.  No skin changes.  No axillary lymphadenopathy. ?RESP: Normal chest excursion without splinting or tachypnea; breath sounds clear and equal bilaterally; no wheezes, no rhonchi, no rales, no hypoxia or respiratory distress, speaking full sentences ?ABD/GI: Normal bowel sounds; non-distended; soft, non-tender, no rebound, no guarding, no peritoneal signs ?BACK: The back appears normal ?EXT: Normal ROM in all joints; no deformity noted, no edema; no cyanosis, no calf tenderness or calf swelling.  No tenderness over the left leg on exam and full range of motion in all joints.  2+ left DP pulse and compartment soft.  Extremity warm and well-perfused.  No redness, rash, induration. ?SKIN: Normal color for age and race; warm; no rash on exposed skin ?NEURO: Moves all extremities equally, normal speech, ambulates with normal gait ?PSYCH: The patient's mood and manner are appropriate. ? ? ?ED Results / Procedures / Treatments  ? ?LABS: ?(all labs ordered are listed, but only abnormal results are displayed) ?Labs Reviewed  ?URINALYSIS, ROUTINE W REFLEX MICROSCOPIC - Abnormal; Notable for the following components:  ?    Result Value  ? Color, Urine YELLOW (*)   ? APPearance HAZY (*)   ? Hgb urine dipstick SMALL (*)   ? Bacteria, UA RARE (*)   ? All other components within normal limits  ?RESP PANEL BY RT-PCR (FLU A&B, COVID) [id number]  ?CBC WITH DIFFERENTIAL/PLATELET  ?COMPREHENSIVE METABOLIC PANEL  ?POC URINE PREG, ED  ? ? ? ?EKG: ? ? ?RADIOLOGY: ?My personal review and interpretation of imaging: Chest x-ray clear.  Pelvic ultrasound shows no acute abnormality with normal blood flow to both ovaries.  She does have bilateral follicles but no cysts.  X-ray of the left lower extremity shows no acute traumatic injury. ? ?I have personally reviewed all radiology reports.   ?DG Chest 2 View ? ?Result Date: 09/30/2021 ?CLINICAL  DATA:  Cough EXAM: CHEST - 2 VIEW COMPARISON:  None. FINDINGS: The heart size and mediastinal contours are within normal limits. Both lungs are clear. The visualized skeletal structures are unremarkable. IMPRESSION: No active cardiopulmonary disease. Electronically Signed   By: Fidela Salisbury M.D.   On: 09/30/2021 01:57  ? ?DG Tibia/Fibula Left ? ?Result Date: 09/30/2021 ?CLINICAL DATA:  Left shin pain EXAM: LEFT TIBIA AND FIBULA - 2 VIEW COMPARISON:  None. FINDINGS: There is no evidence of fracture or other focal bone lesions. Soft tissues are unremarkable. IMPRESSION: Negative. Electronically Signed   By: Fidela Salisbury M.D.   On: 09/30/2021 02:06  ? ?  US PELVIC COMPLETE W TRANSVAGINAL AND TORSION R/O ? ?Result Date: 09/30/2021 ?CLINICAL DATA:  Initial evaluation for acute pelvic pain. EXAM: TRANSABDOMINAL AND TRANSVAGINAL ULTRASOUND OF PELVIS DOPPLER ULTRASOUND OF OVARIES TECHNIQUE: Both transabdominal and transvaginal ultrasound examinations of the pelvis were performed. Transabdominal technique was performed for global imaging of the pelvis including uterus, ovaries, adnexal regions, and pelvic cul-de-sac. It was necessary to proceed with endovaginal exam following the transabdominal exam to visualize the endometrium and ovaries. Color and duplex Doppler ultrasound was utilized to evaluate blood flow to the ovaries. COMPARISON:  CT from 05/07/2019. FINDINGS: Uterus Measurements: 7.2 x 5.7 x 6.9 cm = volume: 128.2 mL. Uterus is retroverted. No discrete fibroid or other myometrial abnormality. Endometrium Thickness: 11 mm.  No focal abnormality visualized. Right ovary Measurements: 3.2 x 2.8 x 2.5 cm = volume: 11.5 mL. Approximate 2 cm irregular margined dominant follicle, likely recently ruptured. No other adnexal mass. Left ovary Measurements: 2.5 x 2.3 x 3.2 cm = volume: 9.4 mL. 1.7 cm dominant follicle. No other adnexal mass. Pulsed Doppler evaluation of both ovaries demonstrates normal low-resistance arterial and  venous waveforms. Other findings Small volume free fluid within the pelvis. IMPRESSION: 1. Approximate 2 cm irregular margin dominant right ovarian follicle, likely a recently ruptured follicle. Associated

## 2021-09-30 NOTE — ED Notes (Addendum)
Discharge instructions provided to patient with information on how to obtain a PCP. Patient verbalized understanding of all. Patient ambulated with a steady gait out to the waiting room ?

## 2021-09-30 NOTE — ED Notes (Signed)
Patient resting comfortably on stretcher in room after returning from radiology. RR even and unlabored. Patient awaiting ultrasound at this time. Warm blanket has been provided. ?

## 2021-09-30 NOTE — ED Triage Notes (Addendum)
Pt complains left breast pain when breast is moving for several weeks. Pt states she also for a month has had left leg pain radiating down leg. Pt states she is breast feeding. Pt denies fever, states her arms hurt if she lifts them up. Pt states she also has been having pelvic pain. Pt appears in no acute distress.  ?

## 2021-09-30 NOTE — Discharge Instructions (Addendum)
You may alternate Tylenol 1000 mg every 6 hours as needed for pain, fever and Ibuprofen 800 mg every 6-8 hours as needed for pain, fever.  Please take Ibuprofen with food.  Do not take more than 4000 mg of Tylenol (acetaminophen) in a 24 hour period. ? ? ?Your lab work today was reassuring.  Urine showed no sign of infection and your pregnancy test was negative.  You have no clinical signs of mastitis on exam but please continue to monitor your left-sided breast pain if you develop redness, warmth or fever you will need to be started on antibiotics.  Your pelvic ultrasound was also normal today but did show bilateral follicles 1 of which has ruptured which can cause pain.  The x-rays of your left leg did show a sclerotic lesion within the left hip specifically the left femoral neck that measured approximately 6 cm but was stable compared to imaging in November 2020.  I recommend that you follow-up closely with an orthopedic physician as an outpatient as you will need an MRI not emergently.  We also obtained a chest x-ray today which showed no pneumonia.  Your COVID and flu swabs were negative. ? ? ?Steps to find a Primary Care Provider (PCP): ? ?Call [phone] or [phone] to access "[facility]." ? ?2.  You may also go on the [facility] website at [email or web address] ? ?

## 2021-09-30 NOTE — ED Notes (Signed)
Provider at bedside to evaluate pt.

## 2021-09-30 NOTE — ED Notes (Signed)
Provider at bedside to update pt on test results and plan of care. ?

## 2021-09-30 NOTE — ED Notes (Signed)
Patient resting comfortably on stretcher with her eyes closed. RR even and unlabored. Patient verbalizes no needs or complaints at this time. Patient covered herself with a blanket and went back to sleep. ?

## 2022-10-03 IMAGING — CR DG HIP (WITH OR WITHOUT PELVIS) 2-3V*L*
1 series · 3 of 3 positions shown · non-contrast
Comparison: None.

CLINICAL DATA: Left hip pain

EXAM:
DG HIP (WITH OR WITHOUT PELVIS) 2-3V LEFT

[Series 1: dg hip unilat w or w/o pelvis 2-3 views  · non-contrast · 0.14mm/px · 3 of 3 slices shown]
[im 1/3]
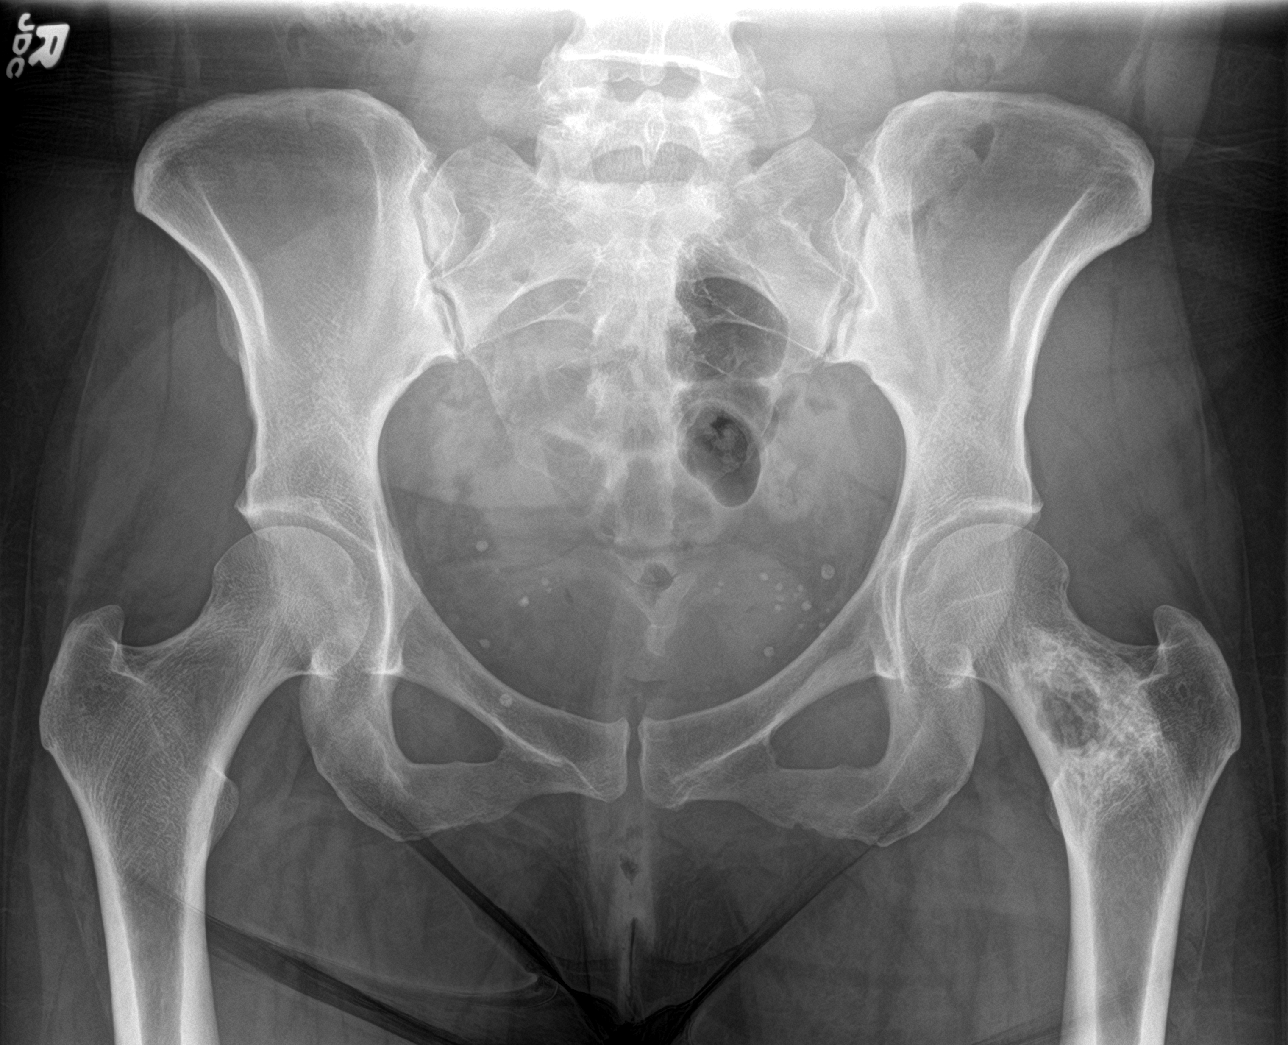
[im 2/3]
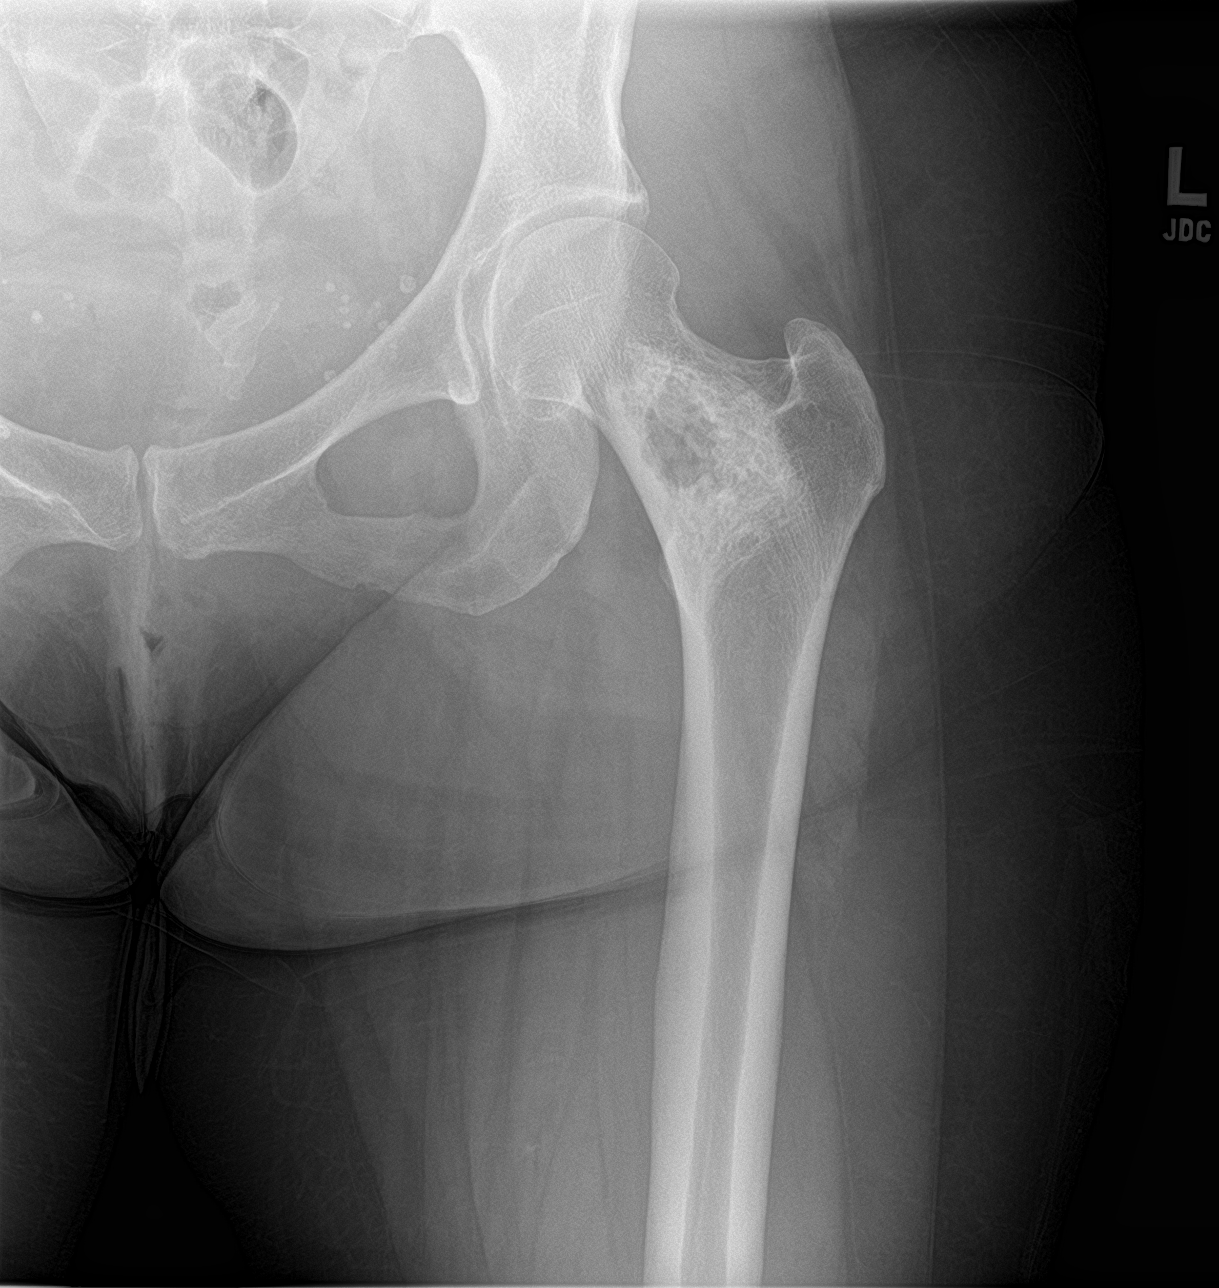
[im 3/3]
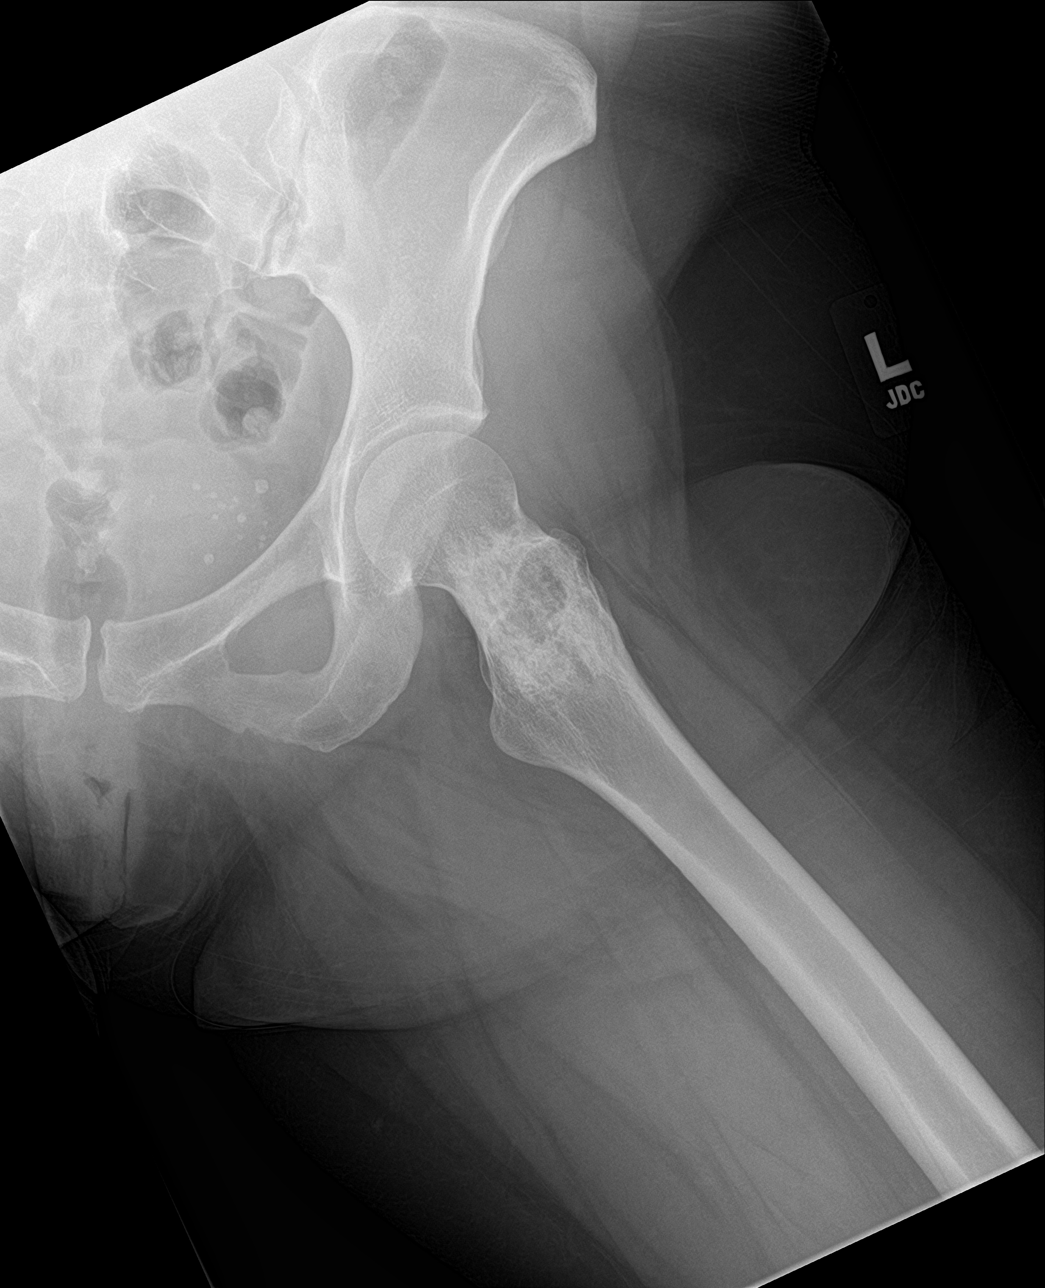

[3 of 3 positions shown; findings below may reference images not displayed]

FINDINGS: Normal alignment. No acute fracture or dislocation. Sacroiliac and
hip joint spaces are preserved. A mixed lytic and sclerotic lesion
is seen within the left femoral neck extending into the
intratrochanteric region of the left hip measuring 4.4 x 6.0 cm,
grossly stable since prior CT examination of 05/07/2019. This may
represent a a mass such as a liposclerosing myxofibrous tumor. This
would be better assessed with contrast enhanced MRI examination for
further evaluation, particularly if there is referable tenderness.
No abnormal periosteal reaction or definite associated soft tissue
mass is identified. Soft tissues are otherwise unremarkable.
IMPRESSION: No acute fracture or dislocation.

Stable 6 cm mixed lytic and sclerotic mass within the left femoral
neck. This may be better assessed with dedicated contrast enhanced
MRI examination, particularly if there is referable symptoms.

## 2022-10-03 IMAGING — US US PELVIS COMPLETE TRANSABD/TRANSVAG W DUPLEX AND/OR DOPPLER
1 series · 13 of 25 positions shown · non-contrast
Comparison: CT from 05/07/2019.

CLINICAL DATA: Initial evaluation for acute pelvic pain.

EXAM:
TRANSABDOMINAL AND TRANSVAGINAL ULTRASOUND OF PELVIS
DOPPLER ULTRASOUND OF OVARIES
TECHNIQUE: Both transabdominal and transvaginal ultrasound examinations of the
pelvis were performed. Transabdominal technique was performed for
global imaging of the pelvis including uterus, ovaries, adnexal
regions, and pelvic cul-de-sac.
It was necessary to proceed with endovaginal exam following the
transabdominal exam to visualize the endometrium and ovaries. Color
and duplex Doppler ultrasound was utilized to evaluate blood flow to
the ovaries.

[Series 1: us pelvic complete w transvaginal and torsion righ · 13 of 60 slices shown]
[im 1/60]
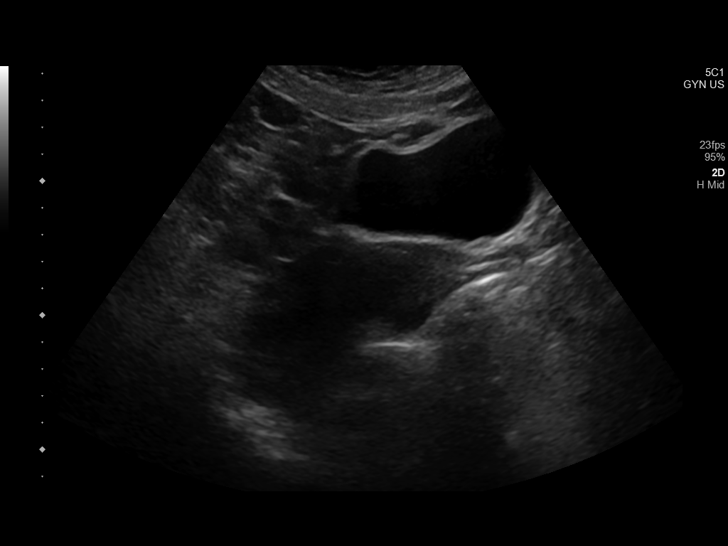
[im 5/60]
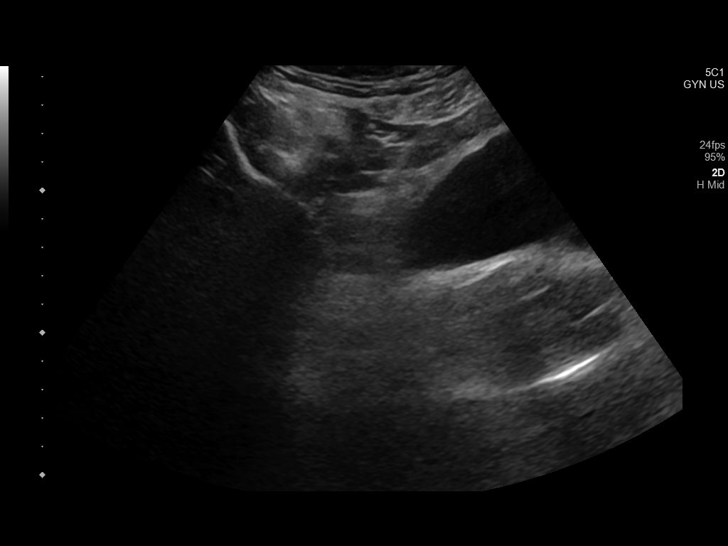
[im 10/60]
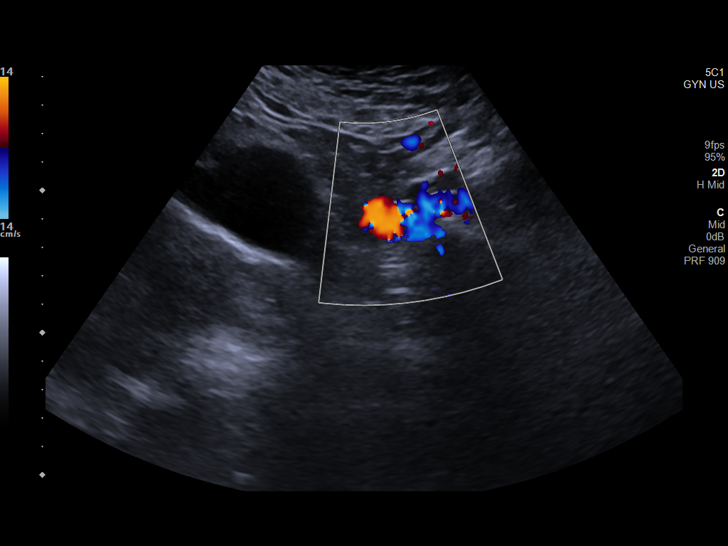
[im 15/60]
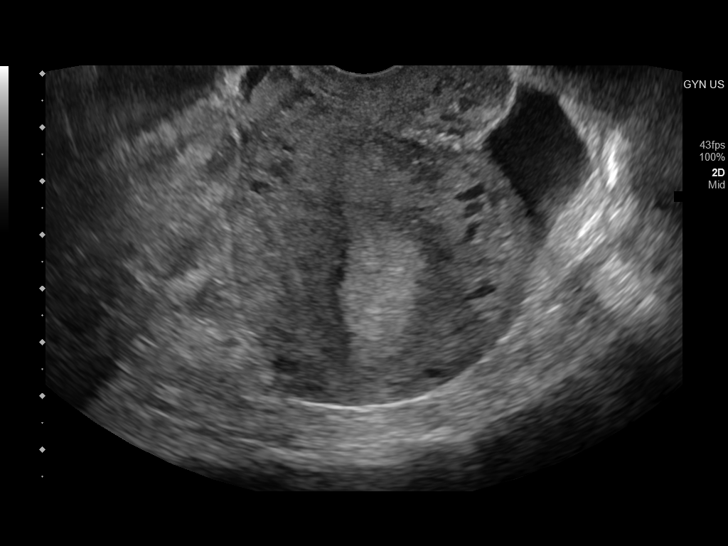
[im 20/60]
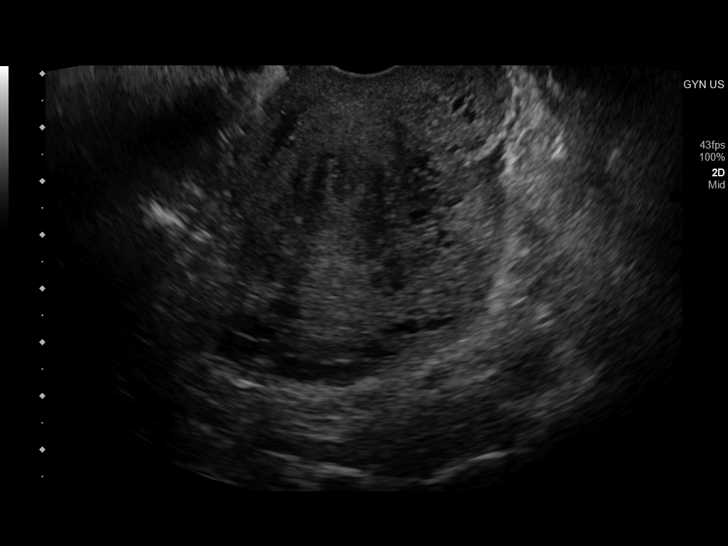
[im 25/60]
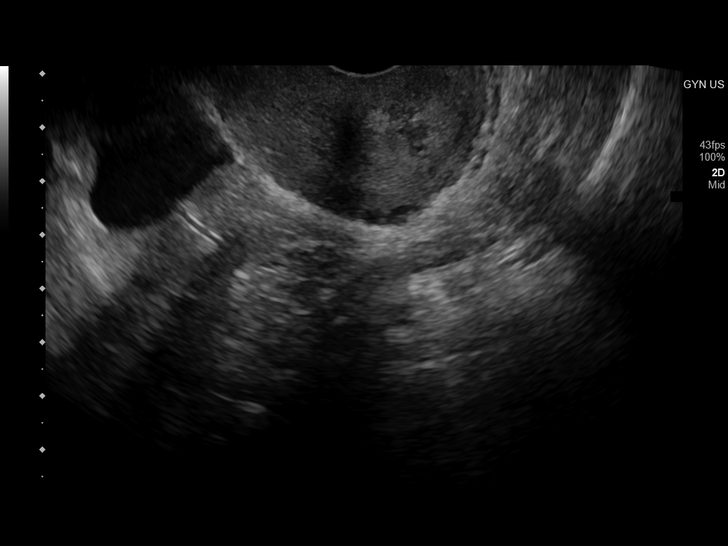
[im 30/60]
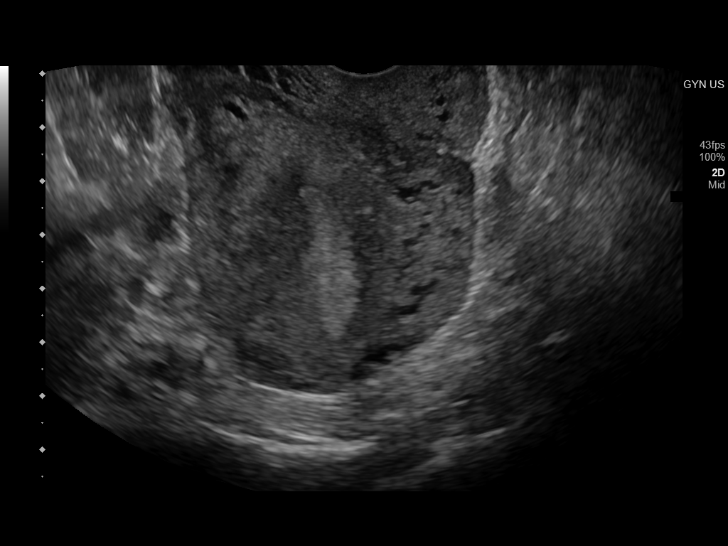
[im 35/60]
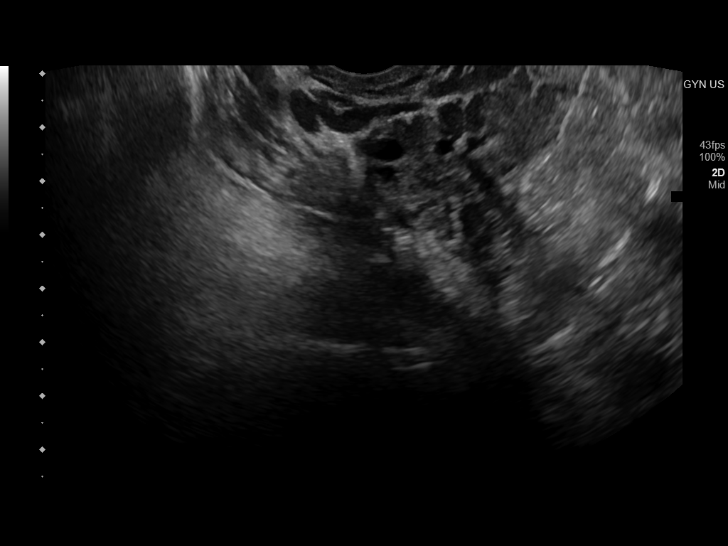
[im 40/60]
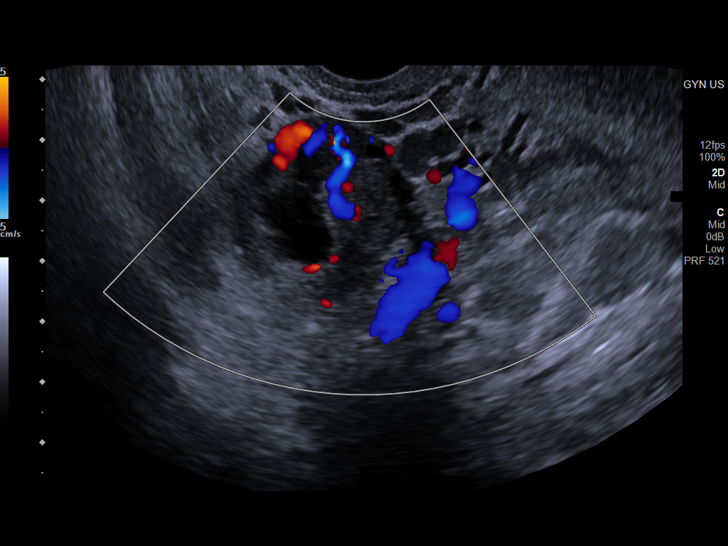
[im 45/60]
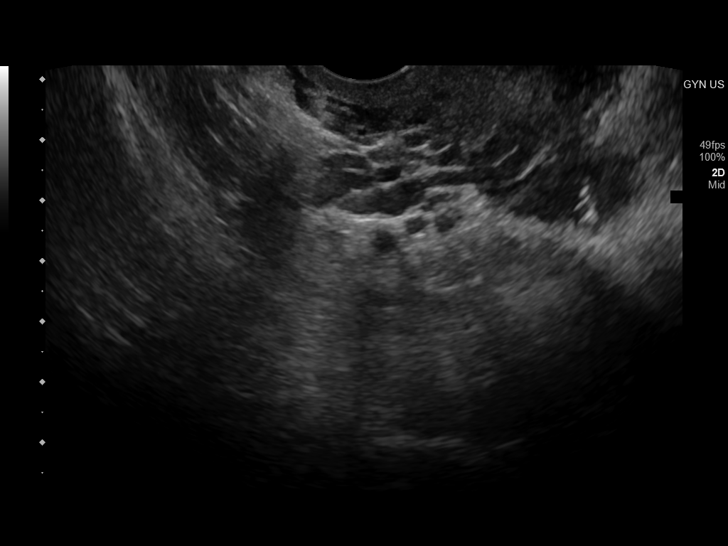
[im 50/60]
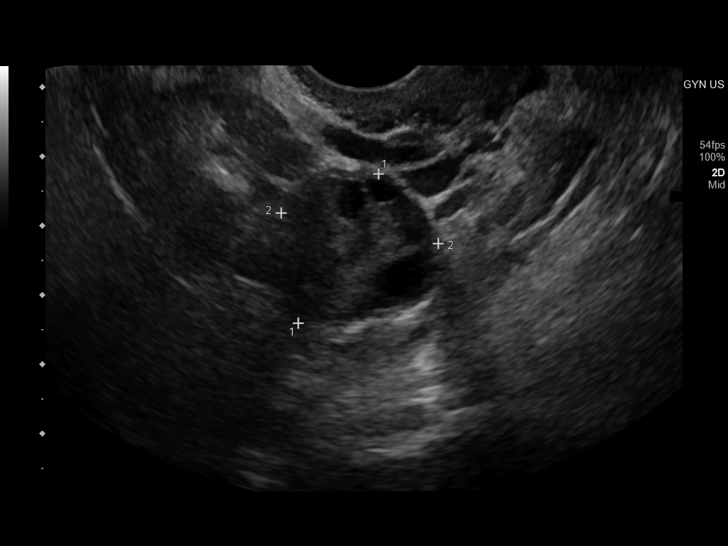
[im 55/60]
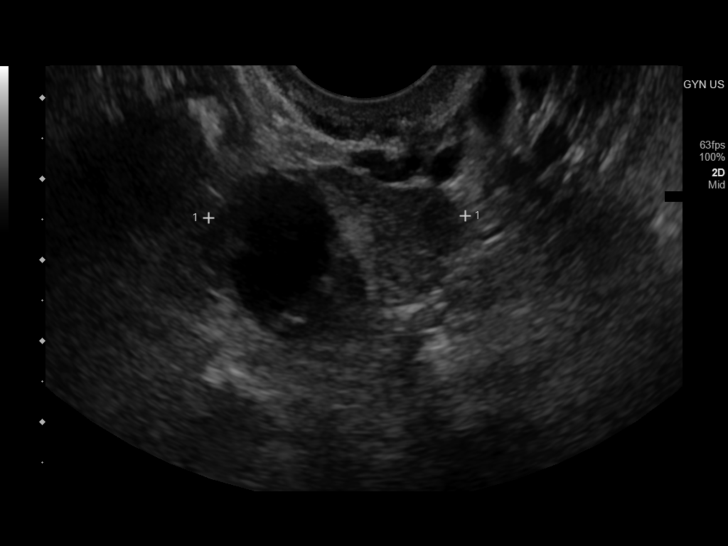
[im 60/60]
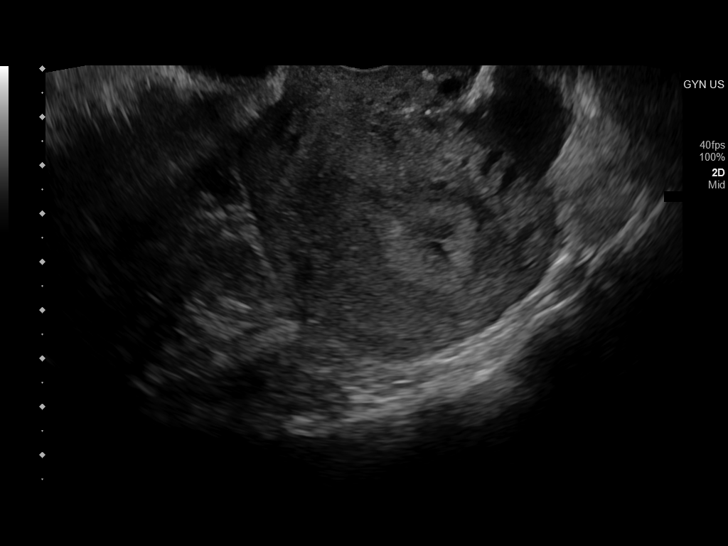

[13 of 25 positions shown; findings below may reference images not displayed]

FINDINGS: Uterus

Measurements: 7.2 x 5.7 x 6.9 cm = volume: 128.2 mL. Uterus is
retroverted. No discrete fibroid or other myometrial abnormality.

Endometrium

Thickness: 11 mm.  No focal abnormality visualized.

Right ovary

Measurements: 3.2 x 2.8 x 2.5 cm = volume: 11.5 mL. Approximate 2 cm
irregular margined dominant follicle, likely recently ruptured. No
other adnexal mass.

Left ovary

Measurements: 2.5 x 2.3 x 3.2 cm = volume: 9.4 mL. 1.7 cm dominant
follicle. No other adnexal mass.

Pulsed Doppler evaluation of both ovaries demonstrates normal
low-resistance arterial and venous waveforms.

Other findings

Small volume free fluid within the pelvis.
IMPRESSION: 1. Approximate 2 cm irregular margin dominant right ovarian
follicle, likely a recently ruptured follicle. Associated small
volume free fluid within the pelvis, also consistent with recent
rupture.
2. Additional 1.7 cm dominant left ovarian follicle.
3. No evidence for ovarian torsion or other acute abnormality.

## 2022-10-04 ENCOUNTER — Ambulatory Visit
Admission: EM | Admit: 2022-10-04 | Discharge: 2022-10-04 | Disposition: A | Discharge status: Home / Self Care | Payer: BC Managed Care – PPO | Attending: Family Medicine | Admitting: Family Medicine

## 2022-10-04 DIAGNOSIS — S39012A Strain of muscle, fascia and tendon of lower back, initial encounter: Secondary | ICD-10-CM | POA: Insufficient documentation

## 2022-10-04 LAB — URINALYSIS, W/ REFLEX TO CULTURE (INFECTION SUSPECTED)
Bilirubin Urine: NEGATIVE
Glucose, UA: NEGATIVE mg/dL
Ketones, ur: NEGATIVE mg/dL
Leukocytes,Ua: NEGATIVE
Nitrite: NEGATIVE
Protein, ur: NEGATIVE mg/dL
Specific Gravity, Urine: >1.03 — ABNORMAL HIGH (ref 1.005–1.030)
pH: 5.5 (ref 5.0–8.0)

## 2022-10-04 MED ORDER — METHOCARBAMOL 500 MG PO TABS: 500.0000 mg | ORAL_TABLET | Freq: Two times a day (BID) | ORAL | 0 refills | Status: DC

## 2022-10-04 MED ORDER — NAPROXEN 500 MG PO TABS: 500.0000 mg | ORAL_TABLET | Freq: Two times a day (BID) | ORAL | 0 refills | Status: DC

## 2022-10-04 NOTE — Discharge Instructions (Addendum)
If medication was prescribed, stop by the pharmacy to pick up your prescriptions.  For your back pain, Take 1500 mg Tylenol twice a day, take muscle relaxer (Robaxin/methocarbamol) twice a day, take Naprosyn twice a day,  as needed for pain.  Hold your home Mobic while taking Naprosyn.  Consider stopping by the pharmacy or dollar store to pick up some Lidocaine patches. Apply for 12 hours and then remove.   Watch for worsening symptoms such as an increasing weakness or loss of sensation in your arms or legs, increasing pain and/or the loss of bladder or bowel function. Should any of these occur, go to the emergency department immediately.

## 2022-10-04 NOTE — ED Triage Notes (Signed)
Pt c/o lower back pain x4days  Pt states that it feels like a knot in her lower right side  Pt is having urinary burning and denies any blood in urine.  Pt has a history of kidney stones and states that she is not urinating the same amount as she usually does.   Pt denies vaginal odor or discharge.

## 2022-10-04 NOTE — ED Provider Notes (Signed)
[facility]    CSN: [id number] Arrival date & time: 10/04/22  1138      History   Chief Complaint Chief Complaint  Patient presents with   Back Pain    HPI  HPI Teresa GammonChelsea L Ortiz is a 32 y.o. female.   Chelseap presents for waxes and wanes left sided back pain that started a few days ago and radiates to right side, She has some burning after urination. No blood in urine. Took NSAIDs with some relief. Pain described as sharp, annoying, and throbbing. Pain initially was shooting. Pain rated 5/10 but came be 9/10 when it wakes her up from sleep. Has history of kidney stones. Patient's last menstrual period was 09/03/2022. She had a tubal ligation.    Endorses abdominal pain, chest pain (resolved). Denies weakness, numbness, tingling, leg pain, leg weakness, incontinence, perianal numbness.   Continues to have pain with movement. [name] does not feel like her legs are weak.   Has sciatica nerve issues on the left but this feels different.    Past Medical History:  Diagnosis Date   Asthma    childhood   Frequent headaches    Migraine without aura     Patient Active Problem List   Diagnosis Date Noted   Breastfeeding problem 08/07/2020   History of COVID-19 07/24/2020   E. coli UTI 02/14/2020   B12 deficiency anemia 05/01/2018   Migraine without aura 05/11/2014    Past Surgical History:  Procedure Laterality Date   DILATION AND EVACUATION N/A 07/16/2017   Procedure: DILATATION AND EVACUATION;  Surgeon: Linzie CollinEvans, David James, MD;  Location: [facility];  Service: Gynecology;  Laterality: N/A;   EXTRACORPOREAL SHOCK WAVE LITHOTRIPSY Left 07/01/2019   Procedure: EXTRACORPOREAL SHOCK WAVE LITHOTRIPSY (ESWL);  Surgeon: Sondra ComeSninsky, Brian C, MD;  Location: [facility];  Service: Urology;  Laterality: Left;   TUBAL LIGATION Bilateral 07/25/2020   Procedure: POST PARTUM TUBAL LIGATION;  Surgeon: Hildred Laserherry, Anika, MD;  Location: [facility];  Service: Gynecology;  Laterality: Bilateral;     OB History     Gravida  5   Para  4   Term  4   Preterm      AB  1   Living  4      SAB  1   IAB      Ectopic      Multiple  0   Live Births  4            Home Medications    Prior to Admission medications   Medication Sig Start Date End Date Taking? Authorizing Provider  albuterol (VENTOLIN HFA) 108 (90 Base) MCG/ACT inhaler INHALE 2 PUFFS BY MOUTH EVERY 6 HOURS AS NEEDED FOR WHEEZING 09/02/22  Yes [provider]  FLUoxetine (PROZAC) 40 MG capsule Take by mouth. 06/11/22 06/11/23 Yes [provider]  gabapentin (NEURONTIN) 300 MG capsule Take by mouth.   Yes [provider]  meloxicam (MOBIC) 15 MG tablet Take 15 mg by mouth daily.   Yes [provider]  methocarbamol (ROBAXIN) 500 MG tablet Take 1 tablet (500 mg total) by mouth 2 (two) times daily. 10/04/22  Yes Beyla Loney, DO  naproxen (NAPROSYN) 500 MG tablet Take 1 tablet (500 mg total) by mouth 2 (two) times daily with a meal. 10/04/22  Yes Mayola Mcbain, DO  benzonatate (TESSALON) 100 MG capsule Take 2 capsules (200 mg total) by mouth every 8 (eight) hours. If you take this medication during the day you need  to pump and dump your breast milk after taking this medication. 12/25/20   Becky Augustayan, Jeremy, NP  ipratropium (ATROVENT) 0.06 % nasal spray Place 2 sprays into both nostrils 4 (four) times daily. 12/25/20   Becky Augustayan, Jeremy, NP  Multiple Vitamin (MULTIVITAMIN) tablet Take 1 tablet by mouth daily.    [provider]    Family History Family History  Problem Relation Age of Onset   Mental illness Mother        bipolor/depressed   Cervical cancer Mother    Alcohol abuse Father     Social History Social History   Tobacco Use   Smoking status: Never   Smokeless tobacco: Never  Vaping Use   Vaping Use: Never used  Substance Use Topics   Alcohol use: No    Alcohol/week: 0.0 standard drinks of alcohol    Comment: rarely   Drug use: No     Allergies    Sulfa antibiotics   Review of Systems Review of Systems: egative unless otherwise stated in HPI.      Physical Exam Triage Vital Signs ED Triage Vitals  Enc Vitals Group     BP 10/04/22 1149 114/74     Pulse Rate 10/04/22 1149 68     Resp --      Temp 10/04/22 1149 98 F (36.7 C)     Temp Source 10/04/22 1149 Oral     SpO2 10/04/22 1149 98 %     Weight 10/04/22 1147 210 lb (95.3 kg)     Height 10/04/22 1147 5\' 10"  (1.778 m)     Head Circumference --      Peak Flow --      Pain Score 10/04/22 1146 5     Pain Loc --      Pain Edu? --      Excl. in [place]? --    No data found.  Updated Vital Signs BP 114/74 (BP Location: Left Arm)   Pulse 68   Temp 98 F (36.7 C) (Oral)   Ht 5\' 10"  (1.778 m)   Wt 95.3 kg   LMP 09/03/2022   SpO2 98%   BMI 30.13 kg/m   Visual Acuity Right Eye Distance:   Left Eye Distance:   Bilateral Distance:    Right Eye Near:   Left Eye Near:    Bilateral Near:     Physical Exam GEN: well appearing female in no acute distress  CVS: well perfused, regular rate  RESP: speaking in full sentences without pause, no respiratory distress  ABD: Soft, nontender in all quadrants, nondistended, no CVA tenderness MSK:  Lumbar spine: - Inspection: no gross deformity or asymmetry, swelling or ecchymosis. No skin changes  - Palpation: No TTP over the spinous processes,  +bilateral paraspinal muscle tenderness left>right, no SI joint tenderness bilaterally - ROM: full active ROM of the lumbar spine in flexion and extension  - Strength: 5/5 strength of lower extremity in L4-S1 nerve root distributions b/l - Neuro: sensation intact in the L4-S1 nerve root distribution b/l, 2+ L4 and S1 reflexes - Special testing: Negative straight leg raise SKIN: warm, dry, no overly skin rash or erythema    UC Treatments / Results  Labs (all labs ordered are listed, but only abnormal results are displayed) Labs Reviewed  URINALYSIS, W/ REFLEX TO CULTURE (INFECTION  SUSPECTED) - Abnormal; Notable for the following components:      Result Value   Specific Gravity, Urine >1.030 (*)    Hgb urine dipstick TRACE (*)  Bacteria, UA FEW (*)    All other components within normal limits    EKG   Radiology No results found.   Procedures Procedures (including critical care time)  Medications Ordered in UC Medications - No data to display  Initial Impression / Assessment and Plan / UC Course  I have reviewed the triage vital signs and the nursing notes.  Pertinent labs & imaging results that were available during my care of the patient were reviewed by me and considered in my medical decision making (see chart for details).      Pt is a 32 y.o.  female with 4 days of  back pain.  History is concerning for a musculoskeletal injury.   Obtained urinalysis to rule out hematuria.  Urinalysis showed trace hemoglobinuria but under the microscope hematuria is not supported.  Urinalysis not concerning for acute cystitis.  She has no midline spinal tenderness with good range of motion on exam.  Imaging deferred.  I suspect she has lumbar strain.  Patient to gradually return to normal activities, as tolerated and continue ordinary activities within the limits permitted by pain. Prescribed Naproxen sodium  and muscle relaxer  for pain relief.  Advised patient to avoid other NSAIDs while taking Naprosyn. Tylenol and Lidocaine patches PRN for multimodal pain relief. Counseled patient on red flag symptoms and when to seek immediate care.  No red flags suggesting cauda equina syndrome or progressive major motor weakness. Patient to follow up with orthopedic provider if symptoms do not improve with conservative treatment.  Return and ED precautions given.   Discussed MDM, treatment plan and plan for follow-up with patient who agrees with plan.   Final Clinical Impressions(s) / UC Diagnoses   Final diagnoses:  Strain of lumbar region, initial encounter      Discharge Instructions      If medication was prescribed, stop by the pharmacy to pick up your prescriptions.  For your back pain, Take 1500 mg Tylenol twice a day, take muscle relaxer (Robaxin/methocarbamol) twice a day, take Naprosyn twice a day,  as needed for pain.  Hold your home Mobic while taking Naprosyn.  Consider stopping by the pharmacy or dollar store to pick up some Lidocaine patches. Apply for 12 hours and then remove.   Watch for worsening symptoms such as an increasing weakness or loss of sensation in your arms or legs, increasing pain and/or the loss of bladder or bowel function. Should any of these occur, go to the emergency department immediately.       ED Prescriptions     Medication Sig Dispense Auth. Provider   naproxen (NAPROSYN) 500 MG tablet Take 1 tablet (500 mg total) by mouth 2 (two) times daily with a meal. 30 tablet Marializ Ferrebee, DO   methocarbamol (ROBAXIN) 500 MG tablet Take 1 tablet (500 mg total) by mouth 2 (two) times daily. 20 tablet Katha Cabal, DO      PDMP not reviewed this encounter.   Katha Cabal, DO 10/04/22 1344

## 2023-10-20 ENCOUNTER — Ambulatory Visit
Admission: EM | Admit: 2023-10-20 | Discharge: 2023-10-20 | Disposition: A | Discharge status: Home / Self Care | Attending: Emergency Medicine | Admitting: Emergency Medicine

## 2023-10-20 DIAGNOSIS — K146 Glossodynia: Secondary | ICD-10-CM | POA: Diagnosis not present

## 2023-10-20 DIAGNOSIS — M26622 Arthralgia of left temporomandibular joint: Secondary | ICD-10-CM

## 2023-10-20 MED ORDER — CYCLOBENZAPRINE HCL 10 MG PO TABS: 10.0000 mg | ORAL_TABLET | Freq: Every day | ORAL | 0 refills | Status: AC

## 2023-10-20 MED ORDER — NAPROXEN 500 MG PO TABS: 500.0000 mg | ORAL_TABLET | Freq: Two times a day (BID) | ORAL | 0 refills | Status: AC

## 2023-10-20 NOTE — Discharge Instructions (Addendum)
 Naprosyn  combined with 1000 mg of Tylenol  twice a day.  May take an additional 1000 mg of Tylenol  1 more time a day., Flexeril  to take at night, viscous lidocaine , warm salt water rinses.  Follow-up with dentist for a bite guard.  Follow-up with [facility] if no better in several weeks.

## 2023-10-20 NOTE — ED Triage Notes (Signed)
 Patient states that she ate a core to a pineapple in November and her tongue has been hurting since, gotten worse over the past 2 weeks. ;left side of tongue. Left ear pain x 2 days

## 2023-10-20 NOTE — ED Provider Notes (Signed)
 HPI  SUBJECTIVE:  Teresa Ortiz is a 33 y.o. female who presents with 6 months of razor like left tongue pain starting after eating the core of a pineapple.  She states that the soreness is now constant and getting worse recently.  No tongue swelling, recent trauma or irritants.  She reports 3 days of uncharacterizable left ear pain that is also becoming constant and getting worse.  She feels like it is connected to the tongue.  No change in hearing, otorrhea, foreign body insertion, fever.  She reports a headache the other day, but this is since resolved.  She is not sure if she grinds her teeth at night.  She tried 800 mg of ibuprofen  every 4 hours, Orajel, viscous lidocaine .  Viscous lidocaine  helps with the tongue pain.  Symptoms are worse with chewing, talking.  Past medical history negative for TMJ arthralgia, but positive for "jaw locking".  LMP: Last week.  Denies possibility of being pregnant.  PCP: [facility].    Past Medical History:  Diagnosis Date   Asthma    childhood   Frequent headaches    Migraine without aura     Past Surgical History:  Procedure Laterality Date   DILATION AND EVACUATION N/A 07/16/2017   Procedure: DILATATION AND EVACUATION;  Surgeon: Zenobia Hila, MD;  Location: [facility];  Service: Gynecology;  Laterality: N/A;   EXTRACORPOREAL SHOCK WAVE LITHOTRIPSY Left 07/01/2019   Procedure: EXTRACORPOREAL SHOCK WAVE LITHOTRIPSY (ESWL);  Surgeon: Lawerence Pressman, MD;  Location: [facility];  Service: Urology;  Laterality: Left;   TUBAL LIGATION Bilateral 07/25/2020   Procedure: POST PARTUM TUBAL LIGATION;  Surgeon: Teresa Fender, MD;  Location: [facility];  Service: Gynecology;  Laterality: Bilateral;    Family History  Problem Relation Age of Onset   Mental illness Mother        bipolor/depressed   Cervical cancer Mother    Alcohol abuse Father     Social History   Tobacco Use   Smoking status: Never   Smokeless tobacco: Never  Vaping  Use   Vaping status: Never Used  Substance Use Topics   Alcohol use: No    Alcohol/week: 0.0 standard drinks of alcohol    Comment: rarely   Drug use: No    No current facility-administered medications for this encounter.  Current Outpatient Medications:    cyclobenzaprine  (FLEXERIL ) 10 MG tablet, Take 1 tablet (10 mg total) by mouth at bedtime., Disp: 20 tablet, Rfl: 0   FLUoxetine (PROZAC) 40 MG capsule, Take by mouth., Disp: , Rfl:    gabapentin (NEURONTIN) 300 MG capsule, Take by mouth., Disp: , Rfl:    naproxen  (NAPROSYN ) 500 MG tablet, Take 1 tablet (500 mg total) by mouth 2 (two) times daily., Disp: 20 tablet, Rfl: 0   albuterol (VENTOLIN HFA) 108 (90 Base) MCG/ACT inhaler, INHALE 2 PUFFS BY MOUTH EVERY 6 HOURS AS NEEDED FOR WHEEZING, Disp: , Rfl:    Multiple Vitamin (MULTIVITAMIN) tablet, Take 1 tablet by mouth daily., Disp: , Rfl:   Allergies  Allergen Reactions   Sulfa Antibiotics Rash     ROS  As noted in HPI.   Physical Exam  BP 108/73 (BP Location: Left Arm)   Pulse 67   Temp 98.5 F (36.9 C) (Oral)   Resp 15   LMP 10/12/2023 (Exact Date)   SpO2 100%   Constitutional: Well developed, well nourished, no acute distress Eyes:  EOMI, conjunctiva normal bilaterally HENT: Normocephalic, atraumatic,mucus membranes moist.  Hearing grossly intact and equal bilaterally.  Bilateral external ears, EACs, TMs normally.  No pain with traction on pinna, palpation of mastoid.  Tenderness at the left TMJ.  No crepitus.  Tenderness along the left lateral border of the tongue.  No ulcers, fissures.  No other intraoral ulcers. Neck: No cervical lymphadenopathy Respiratory: Normal inspiratory effort Cardiovascular: Normal rate GI: nondistended skin: No facial rash, skin intact Musculoskeletal: no deformities Neurologic: Alert & oriented x 3, no focal neuro deficits Psychiatric: Speech and behavior appropriate   ED Course   Medications - No data to display  No orders of  the defined types were placed in this encounter.   No results found for this or any previous visit (from the past 24 hours). No results found.  ED Clinical Impression  1. Arthralgia of left temporomandibular joint   2. Tongue and jaw pain      ED Assessment/Plan     Suspect that patient is grinding her teeth at night which has inflamed her TMJ.  I suspect that she is catching her tongue while grinding her teeth.  Will send home with Naprosyn /Tylenol , Flexeril  to take at night, viscous lidocaine , warm salt water rinses.  Follow-up with dentist for a bite guard.  Follow-up with ENT if no better in several weeks.  Will provide [facility] information.  Discussed MDM, treatment plan, and plan for follow-up with patient. patient agrees with plan.   Meds ordered this encounter  Medications   cyclobenzaprine  (FLEXERIL ) 10 MG tablet    Sig: Take 1 tablet (10 mg total) by mouth at bedtime.    Dispense:  20 tablet    Refill:  0   naproxen  (NAPROSYN ) 500 MG tablet    Sig: Take 1 tablet (500 mg total) by mouth 2 (two) times daily.    Dispense:  20 tablet    Refill:  0      *This clinic note was created using Scientist, clinical (histocompatibility and immunogenetics). Therefore, there may be occasional mistakes despite careful proofreading.  ?    Ethlyn Herd, MD 10/21/23 [phone]

## 2024-07-21 ENCOUNTER — Ambulatory Visit
Admission: EM | Admit: 2024-07-21 | Discharge: 2024-07-21 | Disposition: A | Discharge status: Home / Self Care | Attending: Physician Assistant | Admitting: Physician Assistant

## 2024-07-21 DIAGNOSIS — Z3202 Encounter for pregnancy test, result negative: Secondary | ICD-10-CM | POA: Diagnosis present

## 2024-07-21 DIAGNOSIS — Z113 Encounter for screening for infections with a predominantly sexual mode of transmission: Secondary | ICD-10-CM | POA: Insufficient documentation

## 2024-07-21 DIAGNOSIS — R300 Dysuria: Secondary | ICD-10-CM | POA: Diagnosis not present

## 2024-07-21 DIAGNOSIS — R350 Frequency of micturition: Secondary | ICD-10-CM | POA: Diagnosis not present

## 2024-07-21 LAB — POCT URINE DIPSTICK
Bilirubin, UA: NEGATIVE
Glucose, UA: NEGATIVE mg/dL
Ketones, POC UA: NEGATIVE mg/dL
Leukocytes, UA: NEGATIVE
Nitrite, UA: NEGATIVE
Protein Ur, POC: NEGATIVE mg/dL
Spec Grav, UA: 1.02
Urobilinogen, UA: 1 U/dL
pH, UA: 7

## 2024-07-21 LAB — POCT URINE PREGNANCY: Preg Test, Ur: NEGATIVE

## 2024-07-21 NOTE — ED Triage Notes (Signed)
 Pt c/o urinary freq,burning & vaginal irritation x3 wks. States started mestrual last Monday stopped for 2 days & started bleeding again Monday. Denies any concerns for STD. No OTC meds.

## 2024-07-21 NOTE — ED Provider Notes (Signed)
 " [facility]    CSN: [id number] Arrival date & time: 07/21/24  1626      History   Chief Complaint Chief Complaint  Patient presents with   Dysuria    HPI Teresa Ortiz is a 34 y.o. female presenting for ~3 week history of dysuria, urinary frequency and vaginal irritation. Denies vaginal discharge or odor. No pelvic/abdominal or flank pain. No significant concern for STIs. She is sexually active with one partner and they do not use protection. Has been on menstrual period for the past 8 days.   HPI  Past Medical History:  Diagnosis Date   Asthma    childhood   Frequent headaches    Migraine without aura     Patient Active Problem List   Diagnosis Date Noted   Breastfeeding problem 08/07/2020   History of COVID-19 07/24/2020   E. coli UTI 02/14/2020   B12 deficiency anemia 05/01/2018   Migraine without aura 05/11/2014    Past Surgical History:  Procedure Laterality Date   DILATION AND EVACUATION N/A 07/16/2017   Procedure: DILATATION AND EVACUATION;  Surgeon: Janit Alm Agent, MD;  Location: [facility];  Service: Gynecology;  Laterality: N/A;   EXTRACORPOREAL SHOCK WAVE LITHOTRIPSY Left 07/01/2019   Procedure: EXTRACORPOREAL SHOCK WAVE LITHOTRIPSY (ESWL);  Surgeon: Francisca Redell BROCKS, MD;  Location: [facility];  Service: Urology;  Laterality: Left;   TUBAL LIGATION Bilateral 07/25/2020   Procedure: POST PARTUM TUBAL LIGATION;  Surgeon: Connell Davies, MD;  Location: [facility];  Service: Gynecology;  Laterality: Bilateral;    OB History     Gravida  5   Para  4   Term  4   Preterm      AB  1   Living  4      SAB  1   IAB      Ectopic      Multiple  0   Live Births  4            Home Medications    Prior to Admission medications  Medication Sig Start Date End Date Taking? Authorizing Provider  albuterol (VENTOLIN HFA) 108 (90 Base) MCG/ACT inhaler INHALE 2 PUFFS BY MOUTH EVERY 6 HOURS AS NEEDED FOR WHEEZING 09/02/22   [provider]  cyclobenzaprine  (FLEXERIL ) 10 MG tablet Take 1 tablet (10 mg total) by mouth at bedtime. 10/20/23   Mortenson, Ashley, MD  FLUoxetine (PROZAC) 40 MG capsule Take by mouth. 06/11/22 10/20/23  [provider]  gabapentin (NEURONTIN) 300 MG capsule Take by mouth.    [provider]  Multiple Vitamin (MULTIVITAMIN) tablet Take 1 tablet by mouth daily.    [provider]  naproxen  (NAPROSYN ) 500 MG tablet Take 1 tablet (500 mg total) by mouth 2 (two) times daily. 10/20/23   Van Knee, MD    Family History Family History  Problem Relation Age of Onset   Mental illness Mother        bipolor/depressed   Cervical cancer Mother    Alcohol abuse Father     Social History Social History[1]   Allergies   Sulfa antibiotics   Review of Systems Review of Systems  Constitutional:  Negative for chills and fever.  Gastrointestinal:  Negative for abdominal pain, diarrhea, nausea and vomiting.  Genitourinary:  Positive for dysuria, frequency and urgency. Negative for decreased urine volume, flank pain, hematuria, pelvic pain, vaginal bleeding, vaginal discharge and vaginal pain.  Musculoskeletal:  Negative for back pain.  Skin:  Negative for rash.     Physical Exam Triage Vital Signs ED Triage Vitals  Encounter Vitals Group     BP      Girls Systolic BP Percentile      Girls Diastolic BP Percentile      Boys Systolic BP Percentile      Boys Diastolic BP Percentile      Pulse      Resp      Temp      Temp src      SpO2      Weight      Height      Head Circumference      Peak Flow      Pain Score      Pain Loc      Pain Education      Exclude from Growth Chart    No data found.  Updated Vital Signs BP 115/77 (BP Location: Left Arm)   Pulse 77   Temp 98.3 F (36.8 C) (Oral)   Resp 18   Wt 224 lb 4.8 oz (101.7 kg)   LMP 07/12/2024   SpO2 97%   BMI 32.18 kg/m     Physical Exam Vitals and nursing note reviewed.   Constitutional:      General: She is not in acute distress.    Appearance: Normal appearance. She is not ill-appearing or toxic-appearing.  HENT:     Head: Normocephalic and atraumatic.  Eyes:     General: No scleral icterus.       Right eye: No discharge.        Left eye: No discharge.     Conjunctiva/sclera: Conjunctivae normal.  Cardiovascular:     Rate and Rhythm: Normal rate and regular rhythm.     Heart sounds: Normal heart sounds.  Pulmonary:     Effort: Pulmonary effort is normal. No respiratory distress.     Breath sounds: Normal breath sounds.  Abdominal:     Palpations: Abdomen is soft.     Tenderness: There is no abdominal tenderness. There is no right CVA tenderness or left CVA tenderness.  Musculoskeletal:     Cervical back: Neck supple.  Skin:    General: Skin is dry.  Neurological:     General: No focal deficit present.     Mental Status: She is alert. Mental status is at baseline.     Motor: No weakness.     Gait: Gait normal.  Psychiatric:        Mood and Affect: Mood normal.        Behavior: Behavior normal.      UC Treatments / Results  Labs (all labs ordered are listed, but only abnormal results are displayed) Labs Reviewed  POCT URINE DIPSTICK - Abnormal; Notable for the following components:      Result Value   Blood, UA moderate (*)    All other components within normal limits  POCT URINE PREGNANCY - Normal  URINE CULTURE  CERVICOVAGINAL ANCILLARY ONLY    EKG   Radiology No results found.  Procedures Procedures (including critical care time)  Medications Ordered in UC Medications - No data to display  Initial Impression / Assessment and Plan / UC Course  I have reviewed the triage vital signs and the nursing notes.  Pertinent labs & imaging results that were available during my care of the patient were reviewed by me and considered in my medical decision making (see chart for details).   34 year old female presents  for 3 weeks  history of dysuria, frequency, urgency, vaginal irritation.  Denies any significant concern for STIs.  Patient has been on menstrual period for the last 8 days.  Negative pregnancy test.  Urinalysis shows moderate RBCs but not consistent with urinary tract infection.  Will send for culture and treat if culture is positive.  Patient performed vaginal self swab to check for GC/chlamydia/BV and yeast.  Will treat if positive results.  Follow-up with PCP or gynecology if menstrual cycle continues to be prolonged.  Reviewed ED precautions.   Final Clinical Impressions(s) / UC Diagnoses   Final diagnoses:  Urinary frequency  Dysuria  Routine screening for STI (sexually transmitted infection)  Negative pregnancy test     Discharge Instructions      - The urinalysis is not consistent with a urinary tract infection but we are sending the specimen for culture to look a little further.  That takes a couple days and we will call you if any results are positive. - You performed a vaginal swab to check for vaginal infections and sexually transmitted infections.  That also will take a couple of days and we will call with positive results and treat as needed. - Pregnancy test was negative. - If you continue to have irregular periods please follow-up with your primary care provider or gynecologist. - If you have associated dizziness, feel faint, pass out, have significant pelvic or abdominal pain please seek immediate reevaluation to make sure you have not had too much blood loss or other emergent condition causing symptoms.     ED Prescriptions   None    PDMP not reviewed this encounter.     [1]  Social History Tobacco Use   Smoking status: Never   Smokeless tobacco: Never  Vaping Use   Vaping status: Never Used  Substance Use Topics   Alcohol use: No    Alcohol/week: 0.0 standard drinks of alcohol    Comment: rarely   Drug use: No     Arvis Jolan NOVAK, PA-C 07/21/24 1719  "

## 2024-07-21 NOTE — Discharge Instructions (Addendum)
-   The urinalysis is not consistent with a urinary tract infection but we are sending the specimen for culture to look a little further.  That takes a couple days and we will call you if any results are positive. - You performed a vaginal swab to check for vaginal infections and sexually transmitted infections.  That also will take a couple of days and we will call with positive results and treat as needed. - Pregnancy test was negative. - If you continue to have irregular periods please follow-up with your primary care provider or gynecologist. - If you have associated dizziness, feel faint, pass out, have significant pelvic or abdominal pain please seek immediate reevaluation to make sure you have not had too much blood loss or other emergent condition causing symptoms.

## 2024-07-22 ENCOUNTER — Ambulatory Visit (HOSPITAL_COMMUNITY): Payer: Self-pay

## 2024-07-22 LAB — CERVICOVAGINAL ANCILLARY ONLY
Bacterial Vaginitis (gardnerella): POSITIVE — AB
Candida Glabrata: NEGATIVE
Candida Vaginitis: POSITIVE — AB
Chlamydia: NEGATIVE
Comment: NEGATIVE
Comment: NEGATIVE
Comment: NEGATIVE
Comment: NEGATIVE
Comment: NEGATIVE
Comment: NORMAL
Neisseria Gonorrhea: NEGATIVE
Trichomonas: NEGATIVE

## 2024-07-22 MED ORDER — FLUCONAZOLE 150 MG PO TABS: 150.0000 mg | ORAL_TABLET | Freq: Once | ORAL | 0 refills | Status: AC

## 2024-07-22 MED ORDER — METRONIDAZOLE 500 MG PO TABS: 500.0000 mg | ORAL_TABLET | Freq: Two times a day (BID) | ORAL | 0 refills | Status: AC

## 2024-07-23 LAB — URINE CULTURE: Culture: 20000 — AB

## 2024-07-23 MED ORDER — CEFDINIR 300 MG PO CAPS: 300.0000 mg | ORAL_CAPSULE | Freq: Two times a day (BID) | ORAL | 0 refills | Status: AC
